# Patient Record
Sex: Female | Born: 1935 | Race: Black or African American | Hispanic: No | Smoking: Never smoker
Health system: Southern US, Community
[De-identification: ages and names within clinical notes are randomized; demographics above are authoritative.]

## PROBLEM LIST (undated history)

## (undated) DIAGNOSIS — D62 Acute posthemorrhagic anemia: Secondary | ICD-10-CM

## (undated) DIAGNOSIS — I10 Essential (primary) hypertension: Secondary | ICD-10-CM

## (undated) DIAGNOSIS — F03C0 Unspecified dementia, severe, without behavioral disturbance, psychotic disturbance, mood disturbance, and anxiety: Secondary | ICD-10-CM

## (undated) DIAGNOSIS — R011 Cardiac murmur, unspecified: Secondary | ICD-10-CM

## (undated) DIAGNOSIS — K922 Gastrointestinal hemorrhage, unspecified: Secondary | ICD-10-CM

## (undated) DIAGNOSIS — K5790 Diverticulosis of intestine, part unspecified, without perforation or abscess without bleeding: Secondary | ICD-10-CM

## (undated) DIAGNOSIS — M1990 Unspecified osteoarthritis, unspecified site: Secondary | ICD-10-CM

## (undated) DIAGNOSIS — R413 Other amnesia: Secondary | ICD-10-CM

## (undated) DIAGNOSIS — F0390 Unspecified dementia without behavioral disturbance: Secondary | ICD-10-CM

## (undated) HISTORY — DX: Essential (primary) hypertension: I10

## (undated) HISTORY — DX: Cardiac murmur, unspecified: R01.1

## (undated) HISTORY — DX: Unspecified osteoarthritis, unspecified site: M19.90

---

## 1977-11-16 HISTORY — PX: DILATION AND CURETTAGE OF UTERUS: SHX78

## 1979-11-17 HISTORY — PX: ABDOMINAL HYSTERECTOMY: SHX81

## 1980-11-16 HISTORY — PX: COLONOSCOPY: SHX174

## 1990-11-16 HISTORY — PX: BREAST BIOPSY: SHX20

## 1990-11-16 HISTORY — PX: BLADDER SURGERY: SHX569

## 1992-11-16 HISTORY — PX: RECTOCELE REPAIR: SHX761

## 2000-08-09 HISTORY — PX: CHOLECYSTECTOMY: SHX55

## 2003-11-17 DIAGNOSIS — R011 Cardiac murmur, unspecified: Secondary | ICD-10-CM

## 2003-11-17 DIAGNOSIS — M1990 Unspecified osteoarthritis, unspecified site: Secondary | ICD-10-CM

## 2003-11-17 HISTORY — DX: Cardiac murmur, unspecified: R01.1

## 2003-11-17 HISTORY — DX: Unspecified osteoarthritis, unspecified site: M19.90

## 2004-03-22 ENCOUNTER — Other Ambulatory Visit: Payer: Self-pay

## 2004-09-25 ENCOUNTER — Ambulatory Visit: Payer: Self-pay | Admitting: General Surgery

## 2005-01-09 ENCOUNTER — Emergency Department: Payer: Self-pay | Admitting: Emergency Medicine

## 2005-01-12 ENCOUNTER — Emergency Department: Payer: Self-pay | Admitting: Emergency Medicine

## 2005-07-16 ENCOUNTER — Other Ambulatory Visit: Payer: Self-pay

## 2005-07-16 ENCOUNTER — Emergency Department: Payer: Self-pay | Admitting: Emergency Medicine

## 2005-10-01 ENCOUNTER — Ambulatory Visit: Payer: Self-pay | Admitting: General Surgery

## 2006-01-28 ENCOUNTER — Emergency Department: Payer: Self-pay | Admitting: Emergency Medicine

## 2006-01-28 ENCOUNTER — Other Ambulatory Visit: Payer: Self-pay

## 2006-01-30 ENCOUNTER — Other Ambulatory Visit: Payer: Self-pay

## 2006-01-30 ENCOUNTER — Emergency Department: Payer: Self-pay | Admitting: Internal Medicine

## 2006-08-16 ENCOUNTER — Ambulatory Visit: Payer: Self-pay | Admitting: Gastroenterology

## 2006-10-19 ENCOUNTER — Ambulatory Visit: Payer: Self-pay | Admitting: General Surgery

## 2007-03-17 ENCOUNTER — Emergency Department: Payer: Self-pay | Admitting: Unknown Physician Specialty

## 2007-05-31 ENCOUNTER — Inpatient Hospital Stay: Payer: Self-pay | Admitting: Internal Medicine

## 2007-05-31 ENCOUNTER — Other Ambulatory Visit: Payer: Self-pay

## 2007-06-09 ENCOUNTER — Emergency Department: Payer: Self-pay

## 2007-06-11 ENCOUNTER — Emergency Department: Payer: Self-pay

## 2007-06-11 ENCOUNTER — Other Ambulatory Visit: Payer: Self-pay

## 2007-06-19 ENCOUNTER — Other Ambulatory Visit: Payer: Self-pay

## 2007-06-19 ENCOUNTER — Inpatient Hospital Stay: Payer: Self-pay | Admitting: *Deleted

## 2007-06-25 ENCOUNTER — Emergency Department: Payer: Self-pay | Admitting: Internal Medicine

## 2007-06-25 ENCOUNTER — Other Ambulatory Visit: Payer: Self-pay

## 2007-07-01 ENCOUNTER — Ambulatory Visit: Payer: Self-pay | Admitting: Orthopaedic Surgery

## 2007-07-07 ENCOUNTER — Emergency Department: Payer: Self-pay | Admitting: Emergency Medicine

## 2007-07-07 ENCOUNTER — Other Ambulatory Visit: Payer: Self-pay

## 2007-11-01 ENCOUNTER — Ambulatory Visit: Payer: Self-pay | Admitting: General Surgery

## 2008-02-09 ENCOUNTER — Ambulatory Visit: Payer: Self-pay | Admitting: Family Medicine

## 2008-03-29 ENCOUNTER — Emergency Department: Payer: Self-pay | Admitting: Emergency Medicine

## 2008-05-02 ENCOUNTER — Ambulatory Visit: Payer: Self-pay | Admitting: Gastroenterology

## 2008-11-20 ENCOUNTER — Ambulatory Visit: Payer: Self-pay | Admitting: General Surgery

## 2009-09-09 ENCOUNTER — Ambulatory Visit: Payer: Self-pay | Admitting: Family Medicine

## 2009-10-01 ENCOUNTER — Emergency Department: Payer: Self-pay | Admitting: Emergency Medicine

## 2009-10-07 ENCOUNTER — Emergency Department: Payer: Self-pay | Admitting: Unknown Physician Specialty

## 2009-11-16 HISTORY — PX: CARDIAC CATHETERIZATION: SHX172

## 2009-12-23 ENCOUNTER — Ambulatory Visit: Payer: Self-pay | Admitting: General Surgery

## 2010-01-02 ENCOUNTER — Emergency Department: Payer: Self-pay | Admitting: Emergency Medicine

## 2010-01-30 ENCOUNTER — Emergency Department: Payer: Self-pay | Admitting: Emergency Medicine

## 2010-07-01 ENCOUNTER — Ambulatory Visit: Payer: Self-pay | Admitting: Emergency Medicine

## 2010-08-24 ENCOUNTER — Inpatient Hospital Stay: Payer: Self-pay | Admitting: Internal Medicine

## 2010-09-04 ENCOUNTER — Emergency Department: Payer: Self-pay | Admitting: Emergency Medicine

## 2010-12-07 ENCOUNTER — Emergency Department: Payer: Self-pay | Admitting: Emergency Medicine

## 2010-12-24 ENCOUNTER — Ambulatory Visit: Payer: Self-pay | Admitting: General Surgery

## 2011-05-18 ENCOUNTER — Ambulatory Visit: Payer: Self-pay | Admitting: Rheumatology

## 2011-07-13 ENCOUNTER — Ambulatory Visit: Payer: Self-pay | Admitting: Rheumatology

## 2011-09-29 ENCOUNTER — Emergency Department: Payer: Self-pay | Admitting: Emergency Medicine

## 2011-11-24 DIAGNOSIS — N952 Postmenopausal atrophic vaginitis: Secondary | ICD-10-CM | POA: Diagnosis not present

## 2011-11-24 DIAGNOSIS — N8111 Cystocele, midline: Secondary | ICD-10-CM | POA: Diagnosis not present

## 2011-11-24 DIAGNOSIS — Z01419 Encounter for gynecological examination (general) (routine) without abnormal findings: Secondary | ICD-10-CM | POA: Diagnosis not present

## 2011-12-08 DIAGNOSIS — Z1211 Encounter for screening for malignant neoplasm of colon: Secondary | ICD-10-CM | POA: Diagnosis not present

## 2011-12-09 DIAGNOSIS — F329 Major depressive disorder, single episode, unspecified: Secondary | ICD-10-CM | POA: Diagnosis not present

## 2011-12-09 DIAGNOSIS — F4323 Adjustment disorder with mixed anxiety and depressed mood: Secondary | ICD-10-CM | POA: Diagnosis not present

## 2011-12-17 DIAGNOSIS — H409 Unspecified glaucoma: Secondary | ICD-10-CM | POA: Diagnosis not present

## 2011-12-17 DIAGNOSIS — H4011X0 Primary open-angle glaucoma, stage unspecified: Secondary | ICD-10-CM | POA: Diagnosis not present

## 2011-12-25 DIAGNOSIS — J069 Acute upper respiratory infection, unspecified: Secondary | ICD-10-CM | POA: Diagnosis not present

## 2011-12-25 DIAGNOSIS — J309 Allergic rhinitis, unspecified: Secondary | ICD-10-CM | POA: Diagnosis not present

## 2011-12-25 DIAGNOSIS — R51 Headache: Secondary | ICD-10-CM | POA: Diagnosis not present

## 2011-12-30 ENCOUNTER — Ambulatory Visit: Payer: Self-pay | Admitting: General Surgery

## 2011-12-30 DIAGNOSIS — Z1231 Encounter for screening mammogram for malignant neoplasm of breast: Secondary | ICD-10-CM | POA: Diagnosis not present

## 2012-01-01 DIAGNOSIS — S4380XA Sprain of other specified parts of unspecified shoulder girdle, initial encounter: Secondary | ICD-10-CM | POA: Diagnosis not present

## 2012-01-09 DIAGNOSIS — K5289 Other specified noninfective gastroenteritis and colitis: Secondary | ICD-10-CM | POA: Diagnosis not present

## 2012-01-09 DIAGNOSIS — J309 Allergic rhinitis, unspecified: Secondary | ICD-10-CM | POA: Diagnosis not present

## 2012-01-09 DIAGNOSIS — R51 Headache: Secondary | ICD-10-CM | POA: Diagnosis not present

## 2012-01-13 DIAGNOSIS — M7530 Calcific tendinitis of unspecified shoulder: Secondary | ICD-10-CM | POA: Diagnosis not present

## 2012-02-01 DIAGNOSIS — F329 Major depressive disorder, single episode, unspecified: Secondary | ICD-10-CM | POA: Diagnosis not present

## 2012-02-01 DIAGNOSIS — F4323 Adjustment disorder with mixed anxiety and depressed mood: Secondary | ICD-10-CM | POA: Diagnosis not present

## 2012-02-04 DIAGNOSIS — Z803 Family history of malignant neoplasm of breast: Secondary | ICD-10-CM | POA: Diagnosis not present

## 2012-02-04 DIAGNOSIS — N6019 Diffuse cystic mastopathy of unspecified breast: Secondary | ICD-10-CM | POA: Diagnosis not present

## 2012-02-05 DIAGNOSIS — J0100 Acute maxillary sinusitis, unspecified: Secondary | ICD-10-CM | POA: Diagnosis not present

## 2012-02-05 DIAGNOSIS — J309 Allergic rhinitis, unspecified: Secondary | ICD-10-CM | POA: Diagnosis not present

## 2012-02-05 DIAGNOSIS — R51 Headache: Secondary | ICD-10-CM | POA: Diagnosis not present

## 2012-02-25 DIAGNOSIS — T148XXA Other injury of unspecified body region, initial encounter: Secondary | ICD-10-CM | POA: Diagnosis not present

## 2012-02-25 DIAGNOSIS — R51 Headache: Secondary | ICD-10-CM | POA: Diagnosis not present

## 2012-02-25 DIAGNOSIS — J309 Allergic rhinitis, unspecified: Secondary | ICD-10-CM | POA: Diagnosis not present

## 2012-03-15 DIAGNOSIS — I10 Essential (primary) hypertension: Secondary | ICD-10-CM | POA: Diagnosis not present

## 2012-03-17 DIAGNOSIS — M67919 Unspecified disorder of synovium and tendon, unspecified shoulder: Secondary | ICD-10-CM | POA: Diagnosis not present

## 2012-03-28 ENCOUNTER — Emergency Department: Payer: Self-pay | Admitting: Emergency Medicine

## 2012-03-28 DIAGNOSIS — I10 Essential (primary) hypertension: Secondary | ICD-10-CM | POA: Diagnosis not present

## 2012-03-28 DIAGNOSIS — Z79899 Other long term (current) drug therapy: Secondary | ICD-10-CM | POA: Diagnosis not present

## 2012-03-28 DIAGNOSIS — M129 Arthropathy, unspecified: Secondary | ICD-10-CM | POA: Diagnosis not present

## 2012-03-28 DIAGNOSIS — R0602 Shortness of breath: Secondary | ICD-10-CM | POA: Diagnosis not present

## 2012-03-28 DIAGNOSIS — J309 Allergic rhinitis, unspecified: Secondary | ICD-10-CM | POA: Diagnosis not present

## 2012-03-28 DIAGNOSIS — R51 Headache: Secondary | ICD-10-CM | POA: Diagnosis not present

## 2012-03-28 LAB — CBC WITH DIFFERENTIAL/PLATELET
Basophil %: 1.3 %
Eosinophil #: 0.2 10*3/uL (ref 0.0–0.7)
Eosinophil %: 3.3 %
Lymphocyte #: 1.4 10*3/uL (ref 1.0–3.6)
Lymphocyte %: 29.8 %
MCH: 34 pg (ref 26.0–34.0)
MCHC: 34 g/dL (ref 32.0–36.0)
MCV: 100 fL (ref 80–100)
Monocyte #: 0.3 x10 3/mm (ref 0.2–0.9)
Neutrophil %: 59.6 %
RDW: 13 % (ref 11.5–14.5)

## 2012-03-28 LAB — COMPREHENSIVE METABOLIC PANEL
Alkaline Phosphatase: 57 U/L (ref 50–136)
Anion Gap: 6 — ABNORMAL LOW (ref 7–16)
BUN: 13 mg/dL (ref 7–18)
Bilirubin,Total: 0.8 mg/dL (ref 0.2–1.0)
Calcium, Total: 9.2 mg/dL (ref 8.5–10.1)
Chloride: 105 mmol/L (ref 98–107)
Co2: 28 mmol/L (ref 21–32)
Creatinine: 0.6 mg/dL (ref 0.60–1.30)
EGFR (African American): >60
EGFR (Non-African Amer.): >60
Osmolality: 277 (ref 275–301)
Potassium: 3.9 mmol/L (ref 3.5–5.1)
Sodium: 139 mmol/L (ref 136–145)

## 2012-03-28 LAB — URINALYSIS, COMPLETE
Bacteria: NONE SEEN
Bilirubin,UR: NEGATIVE
Glucose,UR: NEGATIVE mg/dL (ref 0–75)
Ph: 7 (ref 4.5–8.0)
Protein: NEGATIVE
Specific Gravity: 1.005 (ref 1.003–1.030)

## 2012-04-06 DIAGNOSIS — M79609 Pain in unspecified limb: Secondary | ICD-10-CM | POA: Diagnosis not present

## 2012-04-07 DIAGNOSIS — H4011X0 Primary open-angle glaucoma, stage unspecified: Secondary | ICD-10-CM | POA: Diagnosis not present

## 2012-04-07 DIAGNOSIS — H409 Unspecified glaucoma: Secondary | ICD-10-CM | POA: Diagnosis not present

## 2012-05-02 DIAGNOSIS — F329 Major depressive disorder, single episode, unspecified: Secondary | ICD-10-CM | POA: Diagnosis not present

## 2012-05-02 DIAGNOSIS — F4323 Adjustment disorder with mixed anxiety and depressed mood: Secondary | ICD-10-CM | POA: Diagnosis not present

## 2012-05-08 ENCOUNTER — Inpatient Hospital Stay: Payer: Self-pay | Admitting: Internal Medicine

## 2012-05-08 DIAGNOSIS — K5730 Diverticulosis of large intestine without perforation or abscess without bleeding: Secondary | ICD-10-CM | POA: Diagnosis not present

## 2012-05-08 DIAGNOSIS — I252 Old myocardial infarction: Secondary | ICD-10-CM | POA: Diagnosis not present

## 2012-05-08 DIAGNOSIS — I2510 Atherosclerotic heart disease of native coronary artery without angina pectoris: Secondary | ICD-10-CM | POA: Diagnosis not present

## 2012-05-08 DIAGNOSIS — K625 Hemorrhage of anus and rectum: Secondary | ICD-10-CM | POA: Diagnosis not present

## 2012-05-08 DIAGNOSIS — K5731 Diverticulosis of large intestine without perforation or abscess with bleeding: Secondary | ICD-10-CM | POA: Diagnosis not present

## 2012-05-08 DIAGNOSIS — K922 Gastrointestinal hemorrhage, unspecified: Secondary | ICD-10-CM | POA: Diagnosis not present

## 2012-05-08 DIAGNOSIS — I10 Essential (primary) hypertension: Secondary | ICD-10-CM | POA: Diagnosis not present

## 2012-05-08 DIAGNOSIS — I200 Unstable angina: Secondary | ICD-10-CM | POA: Diagnosis not present

## 2012-05-08 DIAGNOSIS — Z23 Encounter for immunization: Secondary | ICD-10-CM | POA: Diagnosis not present

## 2012-05-08 DIAGNOSIS — H409 Unspecified glaucoma: Secondary | ICD-10-CM | POA: Diagnosis present

## 2012-05-08 DIAGNOSIS — Z7982 Long term (current) use of aspirin: Secondary | ICD-10-CM | POA: Diagnosis not present

## 2012-05-08 DIAGNOSIS — I248 Other forms of acute ischemic heart disease: Secondary | ICD-10-CM | POA: Diagnosis not present

## 2012-05-08 DIAGNOSIS — M1990 Unspecified osteoarthritis, unspecified site: Secondary | ICD-10-CM | POA: Diagnosis not present

## 2012-05-08 DIAGNOSIS — K921 Melena: Secondary | ICD-10-CM | POA: Diagnosis not present

## 2012-05-08 DIAGNOSIS — R6889 Other general symptoms and signs: Secondary | ICD-10-CM | POA: Diagnosis not present

## 2012-05-08 DIAGNOSIS — K219 Gastro-esophageal reflux disease without esophagitis: Secondary | ICD-10-CM | POA: Diagnosis not present

## 2012-05-08 DIAGNOSIS — I38 Endocarditis, valve unspecified: Secondary | ICD-10-CM | POA: Diagnosis not present

## 2012-05-08 LAB — COMPREHENSIVE METABOLIC PANEL
Alkaline Phosphatase: 65 U/L (ref 50–136)
Anion Gap: 6 — ABNORMAL LOW (ref 7–16)
BUN: 18 mg/dL (ref 7–18)
Bilirubin,Total: 0.3 mg/dL (ref 0.2–1.0)
Calcium, Total: 8.6 mg/dL (ref 8.5–10.1)
Chloride: 103 mmol/L (ref 98–107)
Co2: 27 mmol/L (ref 21–32)
Potassium: 3.9 mmol/L (ref 3.5–5.1)
SGOT(AST): 30 U/L (ref 15–37)

## 2012-05-08 LAB — CBC
HGB: 11 g/dL — ABNORMAL LOW (ref 12.0–16.0)
MCH: 33.5 pg (ref 26.0–34.0)
MCHC: 33.3 g/dL (ref 32.0–36.0)
Platelet: 220 10*3/uL (ref 150–440)
RDW: 12.4 % (ref 11.5–14.5)

## 2012-05-08 LAB — HEMATOCRIT: HCT: 31.4 % — ABNORMAL LOW (ref 35.0–47.0)

## 2012-05-08 LAB — TSH: Thyroid Stimulating Horm: 1.47 u[IU]/mL

## 2012-05-08 LAB — TROPONIN I
Troponin-I: 0.12 ng/mL — ABNORMAL HIGH
Troponin-I: 0.12 ng/mL — ABNORMAL HIGH

## 2012-05-08 LAB — LIPASE, BLOOD: Lipase: 117 U/L (ref 73–393)

## 2012-05-09 LAB — CBC WITH DIFFERENTIAL/PLATELET
Basophil #: 0 10*3/uL (ref 0.0–0.1)
Eosinophil #: 0.2 10*3/uL (ref 0.0–0.7)
HCT: 29.2 % — ABNORMAL LOW (ref 35.0–47.0)
HGB: 9.9 g/dL — ABNORMAL LOW (ref 12.0–16.0)
Lymphocyte #: 0.9 10*3/uL — ABNORMAL LOW (ref 1.0–3.6)
Lymphocyte %: 23.2 %
MCH: 33.9 pg (ref 26.0–34.0)
MCHC: 33.9 g/dL (ref 32.0–36.0)
MCV: 100 fL (ref 80–100)
Monocyte #: 0.3 x10 3/mm (ref 0.2–0.9)
Monocyte %: 8.9 %
Neutrophil #: 2.3 10*3/uL (ref 1.4–6.5)
Platelet: 194 10*3/uL (ref 150–440)
RBC: 2.92 10*6/uL — ABNORMAL LOW (ref 3.80–5.20)
RDW: 12.7 % (ref 11.5–14.5)

## 2012-05-09 LAB — BASIC METABOLIC PANEL
Anion Gap: 6 — ABNORMAL LOW (ref 7–16)
Calcium, Total: 8.2 mg/dL — ABNORMAL LOW (ref 8.5–10.1)
Co2: 26 mmol/L (ref 21–32)
EGFR (Non-African Amer.): >60
Osmolality: 280 (ref 275–301)
Sodium: 141 mmol/L (ref 136–145)

## 2012-05-10 LAB — CBC WITH DIFFERENTIAL/PLATELET
Basophil #: 0.1 10*3/uL (ref 0.0–0.1)
Basophil %: 2.3 %
Eosinophil #: 0.2 10*3/uL (ref 0.0–0.7)
HCT: 30 % — ABNORMAL LOW (ref 35.0–47.0)
MCHC: 33.4 g/dL (ref 32.0–36.0)
MCV: 101 fL — ABNORMAL HIGH (ref 80–100)
Platelet: 192 10*3/uL (ref 150–440)
RBC: 2.98 10*6/uL — ABNORMAL LOW (ref 3.80–5.20)
RDW: 12.7 % (ref 11.5–14.5)
WBC: 3.3 10*3/uL — ABNORMAL LOW (ref 3.6–11.0)

## 2012-05-11 DIAGNOSIS — IMO0001 Reserved for inherently not codable concepts without codable children: Secondary | ICD-10-CM | POA: Diagnosis not present

## 2012-05-11 DIAGNOSIS — M79609 Pain in unspecified limb: Secondary | ICD-10-CM | POA: Diagnosis not present

## 2012-05-11 DIAGNOSIS — M1710 Unilateral primary osteoarthritis, unspecified knee: Secondary | ICD-10-CM | POA: Diagnosis not present

## 2012-05-11 DIAGNOSIS — R269 Unspecified abnormalities of gait and mobility: Secondary | ICD-10-CM | POA: Diagnosis not present

## 2012-05-11 DIAGNOSIS — I2510 Atherosclerotic heart disease of native coronary artery without angina pectoris: Secondary | ICD-10-CM | POA: Diagnosis not present

## 2012-05-11 DIAGNOSIS — I10 Essential (primary) hypertension: Secondary | ICD-10-CM | POA: Diagnosis not present

## 2012-05-16 DIAGNOSIS — M1710 Unilateral primary osteoarthritis, unspecified knee: Secondary | ICD-10-CM | POA: Diagnosis not present

## 2012-05-16 DIAGNOSIS — M79609 Pain in unspecified limb: Secondary | ICD-10-CM | POA: Diagnosis not present

## 2012-05-16 DIAGNOSIS — D649 Anemia, unspecified: Secondary | ICD-10-CM | POA: Diagnosis not present

## 2012-05-16 DIAGNOSIS — I2510 Atherosclerotic heart disease of native coronary artery without angina pectoris: Secondary | ICD-10-CM | POA: Diagnosis not present

## 2012-05-16 DIAGNOSIS — K5732 Diverticulitis of large intestine without perforation or abscess without bleeding: Secondary | ICD-10-CM | POA: Diagnosis not present

## 2012-05-16 DIAGNOSIS — I10 Essential (primary) hypertension: Secondary | ICD-10-CM | POA: Diagnosis not present

## 2012-05-16 DIAGNOSIS — IMO0001 Reserved for inherently not codable concepts without codable children: Secondary | ICD-10-CM | POA: Diagnosis not present

## 2012-05-16 DIAGNOSIS — R269 Unspecified abnormalities of gait and mobility: Secondary | ICD-10-CM | POA: Diagnosis not present

## 2012-05-20 DIAGNOSIS — M1710 Unilateral primary osteoarthritis, unspecified knee: Secondary | ICD-10-CM | POA: Diagnosis not present

## 2012-05-20 DIAGNOSIS — I2510 Atherosclerotic heart disease of native coronary artery without angina pectoris: Secondary | ICD-10-CM | POA: Diagnosis not present

## 2012-05-20 DIAGNOSIS — IMO0001 Reserved for inherently not codable concepts without codable children: Secondary | ICD-10-CM | POA: Diagnosis not present

## 2012-05-20 DIAGNOSIS — I10 Essential (primary) hypertension: Secondary | ICD-10-CM | POA: Diagnosis not present

## 2012-05-20 DIAGNOSIS — M79609 Pain in unspecified limb: Secondary | ICD-10-CM | POA: Diagnosis not present

## 2012-05-20 DIAGNOSIS — R269 Unspecified abnormalities of gait and mobility: Secondary | ICD-10-CM | POA: Diagnosis not present

## 2012-05-24 DIAGNOSIS — R269 Unspecified abnormalities of gait and mobility: Secondary | ICD-10-CM | POA: Diagnosis not present

## 2012-05-24 DIAGNOSIS — I2510 Atherosclerotic heart disease of native coronary artery without angina pectoris: Secondary | ICD-10-CM | POA: Diagnosis not present

## 2012-05-24 DIAGNOSIS — I10 Essential (primary) hypertension: Secondary | ICD-10-CM | POA: Diagnosis not present

## 2012-05-24 DIAGNOSIS — M79609 Pain in unspecified limb: Secondary | ICD-10-CM | POA: Diagnosis not present

## 2012-05-24 DIAGNOSIS — M1710 Unilateral primary osteoarthritis, unspecified knee: Secondary | ICD-10-CM | POA: Diagnosis not present

## 2012-05-24 DIAGNOSIS — IMO0001 Reserved for inherently not codable concepts without codable children: Secondary | ICD-10-CM | POA: Diagnosis not present

## 2012-05-26 DIAGNOSIS — R269 Unspecified abnormalities of gait and mobility: Secondary | ICD-10-CM | POA: Diagnosis not present

## 2012-05-26 DIAGNOSIS — M1710 Unilateral primary osteoarthritis, unspecified knee: Secondary | ICD-10-CM | POA: Diagnosis not present

## 2012-05-26 DIAGNOSIS — I10 Essential (primary) hypertension: Secondary | ICD-10-CM | POA: Diagnosis not present

## 2012-05-26 DIAGNOSIS — IMO0001 Reserved for inherently not codable concepts without codable children: Secondary | ICD-10-CM | POA: Diagnosis not present

## 2012-05-26 DIAGNOSIS — M79609 Pain in unspecified limb: Secondary | ICD-10-CM | POA: Diagnosis not present

## 2012-05-26 DIAGNOSIS — I2510 Atherosclerotic heart disease of native coronary artery without angina pectoris: Secondary | ICD-10-CM | POA: Diagnosis not present

## 2012-05-31 DIAGNOSIS — M79609 Pain in unspecified limb: Secondary | ICD-10-CM | POA: Diagnosis not present

## 2012-05-31 DIAGNOSIS — M1710 Unilateral primary osteoarthritis, unspecified knee: Secondary | ICD-10-CM | POA: Diagnosis not present

## 2012-05-31 DIAGNOSIS — I10 Essential (primary) hypertension: Secondary | ICD-10-CM | POA: Diagnosis not present

## 2012-05-31 DIAGNOSIS — IMO0001 Reserved for inherently not codable concepts without codable children: Secondary | ICD-10-CM | POA: Diagnosis not present

## 2012-05-31 DIAGNOSIS — I2510 Atherosclerotic heart disease of native coronary artery without angina pectoris: Secondary | ICD-10-CM | POA: Diagnosis not present

## 2012-05-31 DIAGNOSIS — R269 Unspecified abnormalities of gait and mobility: Secondary | ICD-10-CM | POA: Diagnosis not present

## 2012-06-09 DIAGNOSIS — K625 Hemorrhage of anus and rectum: Secondary | ICD-10-CM | POA: Diagnosis not present

## 2012-06-22 DIAGNOSIS — M1710 Unilateral primary osteoarthritis, unspecified knee: Secondary | ICD-10-CM | POA: Diagnosis not present

## 2012-06-22 DIAGNOSIS — M79609 Pain in unspecified limb: Secondary | ICD-10-CM | POA: Diagnosis not present

## 2012-06-22 DIAGNOSIS — R269 Unspecified abnormalities of gait and mobility: Secondary | ICD-10-CM | POA: Diagnosis not present

## 2012-06-22 DIAGNOSIS — IMO0001 Reserved for inherently not codable concepts without codable children: Secondary | ICD-10-CM | POA: Diagnosis not present

## 2012-06-23 DIAGNOSIS — D509 Iron deficiency anemia, unspecified: Secondary | ICD-10-CM | POA: Diagnosis not present

## 2012-06-23 DIAGNOSIS — E538 Deficiency of other specified B group vitamins: Secondary | ICD-10-CM | POA: Diagnosis not present

## 2012-06-30 DIAGNOSIS — Z1211 Encounter for screening for malignant neoplasm of colon: Secondary | ICD-10-CM | POA: Diagnosis not present

## 2012-07-06 DIAGNOSIS — R5381 Other malaise: Secondary | ICD-10-CM | POA: Diagnosis not present

## 2012-07-06 DIAGNOSIS — R5383 Other fatigue: Secondary | ICD-10-CM | POA: Diagnosis not present

## 2012-07-06 DIAGNOSIS — R269 Unspecified abnormalities of gait and mobility: Secondary | ICD-10-CM | POA: Diagnosis not present

## 2012-07-06 DIAGNOSIS — M1710 Unilateral primary osteoarthritis, unspecified knee: Secondary | ICD-10-CM | POA: Diagnosis not present

## 2012-07-06 DIAGNOSIS — M79609 Pain in unspecified limb: Secondary | ICD-10-CM | POA: Diagnosis not present

## 2012-07-08 ENCOUNTER — Emergency Department: Payer: Self-pay | Admitting: Emergency Medicine

## 2012-07-08 DIAGNOSIS — I10 Essential (primary) hypertension: Secondary | ICD-10-CM | POA: Diagnosis not present

## 2012-07-08 DIAGNOSIS — R51 Headache: Secondary | ICD-10-CM | POA: Diagnosis not present

## 2012-07-08 DIAGNOSIS — Z79899 Other long term (current) drug therapy: Secondary | ICD-10-CM | POA: Diagnosis not present

## 2012-07-08 DIAGNOSIS — M129 Arthropathy, unspecified: Secondary | ICD-10-CM | POA: Diagnosis not present

## 2012-07-15 DIAGNOSIS — M1710 Unilateral primary osteoarthritis, unspecified knee: Secondary | ICD-10-CM | POA: Diagnosis not present

## 2012-07-15 DIAGNOSIS — R269 Unspecified abnormalities of gait and mobility: Secondary | ICD-10-CM | POA: Diagnosis not present

## 2012-07-15 DIAGNOSIS — M79609 Pain in unspecified limb: Secondary | ICD-10-CM | POA: Diagnosis not present

## 2012-07-15 DIAGNOSIS — J019 Acute sinusitis, unspecified: Secondary | ICD-10-CM | POA: Diagnosis not present

## 2012-07-22 ENCOUNTER — Emergency Department: Payer: Self-pay | Admitting: Emergency Medicine

## 2012-07-22 DIAGNOSIS — R5383 Other fatigue: Secondary | ICD-10-CM | POA: Diagnosis not present

## 2012-07-22 DIAGNOSIS — M1990 Unspecified osteoarthritis, unspecified site: Secondary | ICD-10-CM | POA: Diagnosis not present

## 2012-07-22 DIAGNOSIS — I10 Essential (primary) hypertension: Secondary | ICD-10-CM | POA: Diagnosis not present

## 2012-07-22 DIAGNOSIS — R5381 Other malaise: Secondary | ICD-10-CM | POA: Diagnosis not present

## 2012-07-22 LAB — CBC WITH DIFFERENTIAL/PLATELET
Basophil %: 1.2 %
Eosinophil %: 4 %
HCT: 39.1 % (ref 35.0–47.0)
HGB: 13.5 g/dL (ref 12.0–16.0)
Lymphocyte %: 31.2 %
MCH: 34.3 pg — ABNORMAL HIGH (ref 26.0–34.0)
MCHC: 34.6 g/dL (ref 32.0–36.0)
MCV: 99 fL (ref 80–100)
Monocyte %: 9.1 %
Neutrophil %: 54.5 %
RBC: 3.93 10*6/uL (ref 3.80–5.20)

## 2012-07-22 LAB — CK TOTAL AND CKMB (NOT AT ARMC)
CK, Total: 219 U/L — ABNORMAL HIGH (ref 21–215)
CK-MB: 5.8 ng/mL — ABNORMAL HIGH (ref 0.5–3.6)

## 2012-07-22 LAB — URINALYSIS, COMPLETE
Bacteria: NONE SEEN
Bilirubin,UR: NEGATIVE
Ketone: NEGATIVE
RBC,UR: NONE SEEN /HPF (ref 0–5)
Squamous Epithelial: NONE SEEN
WBC UR: NONE SEEN /HPF (ref 0–5)

## 2012-07-22 LAB — HEPATIC FUNCTION PANEL A (ARMC)
Albumin: 3.9 g/dL (ref 3.4–5.0)
Alkaline Phosphatase: 64 U/L (ref 50–136)
Bilirubin, Direct: 0.2 mg/dL (ref 0.00–0.20)
SGPT (ALT): 26 U/L (ref 12–78)
Total Protein: 8.1 g/dL (ref 6.4–8.2)

## 2012-07-22 LAB — TROPONIN I: Troponin-I: 0.14 ng/mL — ABNORMAL HIGH

## 2012-07-22 LAB — BASIC METABOLIC PANEL
Calcium, Total: 9.2 mg/dL (ref 8.5–10.1)
EGFR (Non-African Amer.): >60
Glucose: 109 mg/dL — ABNORMAL HIGH (ref 65–99)
Osmolality: 273 (ref 275–301)
Potassium: 4.1 mmol/L (ref 3.5–5.1)

## 2012-07-22 LAB — TSH: Thyroid Stimulating Horm: 0.911 u[IU]/mL

## 2012-07-29 DIAGNOSIS — I10 Essential (primary) hypertension: Secondary | ICD-10-CM | POA: Diagnosis not present

## 2012-07-29 DIAGNOSIS — M79609 Pain in unspecified limb: Secondary | ICD-10-CM | POA: Diagnosis not present

## 2012-07-29 DIAGNOSIS — I2510 Atherosclerotic heart disease of native coronary artery without angina pectoris: Secondary | ICD-10-CM | POA: Diagnosis not present

## 2012-07-29 DIAGNOSIS — R0602 Shortness of breath: Secondary | ICD-10-CM | POA: Diagnosis not present

## 2012-08-03 DIAGNOSIS — K219 Gastro-esophageal reflux disease without esophagitis: Secondary | ICD-10-CM | POA: Diagnosis not present

## 2012-08-03 DIAGNOSIS — R1011 Right upper quadrant pain: Secondary | ICD-10-CM | POA: Diagnosis not present

## 2012-08-03 DIAGNOSIS — R109 Unspecified abdominal pain: Secondary | ICD-10-CM | POA: Diagnosis not present

## 2012-08-03 DIAGNOSIS — K222 Esophageal obstruction: Secondary | ICD-10-CM | POA: Diagnosis not present

## 2012-08-08 DIAGNOSIS — K319 Disease of stomach and duodenum, unspecified: Secondary | ICD-10-CM | POA: Diagnosis not present

## 2012-08-08 DIAGNOSIS — K2970 Gastritis, unspecified, without bleeding: Secondary | ICD-10-CM | POA: Diagnosis not present

## 2012-08-08 DIAGNOSIS — R109 Unspecified abdominal pain: Secondary | ICD-10-CM | POA: Diagnosis not present

## 2012-08-08 DIAGNOSIS — K2940 Chronic atrophic gastritis without bleeding: Secondary | ICD-10-CM | POA: Diagnosis not present

## 2012-08-08 DIAGNOSIS — K449 Diaphragmatic hernia without obstruction or gangrene: Secondary | ICD-10-CM | POA: Diagnosis not present

## 2012-08-08 DIAGNOSIS — K2990 Gastroduodenitis, unspecified, without bleeding: Secondary | ICD-10-CM | POA: Diagnosis not present

## 2012-08-12 ENCOUNTER — Ambulatory Visit: Payer: Self-pay | Admitting: Emergency Medicine

## 2012-08-12 DIAGNOSIS — K224 Dyskinesia of esophagus: Secondary | ICD-10-CM | POA: Diagnosis not present

## 2012-08-12 DIAGNOSIS — R1310 Dysphagia, unspecified: Secondary | ICD-10-CM | POA: Diagnosis not present

## 2012-08-16 DIAGNOSIS — I69991 Dysphagia following unspecified cerebrovascular disease: Secondary | ICD-10-CM | POA: Diagnosis not present

## 2012-08-25 DIAGNOSIS — H2510 Age-related nuclear cataract, unspecified eye: Secondary | ICD-10-CM | POA: Diagnosis not present

## 2012-08-25 DIAGNOSIS — H4011X0 Primary open-angle glaucoma, stage unspecified: Secondary | ICD-10-CM | POA: Diagnosis not present

## 2012-08-25 DIAGNOSIS — H409 Unspecified glaucoma: Secondary | ICD-10-CM | POA: Diagnosis not present

## 2012-08-31 DIAGNOSIS — N993 Prolapse of vaginal vault after hysterectomy: Secondary | ICD-10-CM | POA: Diagnosis not present

## 2012-08-31 DIAGNOSIS — N8111 Cystocele, midline: Secondary | ICD-10-CM | POA: Diagnosis not present

## 2012-08-31 DIAGNOSIS — R339 Retention of urine, unspecified: Secondary | ICD-10-CM | POA: Diagnosis not present

## 2012-08-31 DIAGNOSIS — N3941 Urge incontinence: Secondary | ICD-10-CM | POA: Diagnosis not present

## 2012-09-12 DIAGNOSIS — M79609 Pain in unspecified limb: Secondary | ICD-10-CM | POA: Diagnosis not present

## 2012-09-12 DIAGNOSIS — Z0000 Encounter for general adult medical examination without abnormal findings: Secondary | ICD-10-CM | POA: Diagnosis not present

## 2012-09-12 DIAGNOSIS — R0602 Shortness of breath: Secondary | ICD-10-CM | POA: Diagnosis not present

## 2012-09-12 DIAGNOSIS — I10 Essential (primary) hypertension: Secondary | ICD-10-CM | POA: Diagnosis not present

## 2012-09-12 DIAGNOSIS — Z23 Encounter for immunization: Secondary | ICD-10-CM | POA: Diagnosis not present

## 2012-09-13 DIAGNOSIS — I10 Essential (primary) hypertension: Secondary | ICD-10-CM | POA: Diagnosis not present

## 2012-09-15 DIAGNOSIS — E7800 Pure hypercholesterolemia, unspecified: Secondary | ICD-10-CM | POA: Diagnosis not present

## 2012-10-08 DIAGNOSIS — J069 Acute upper respiratory infection, unspecified: Secondary | ICD-10-CM | POA: Diagnosis not present

## 2012-10-10 DIAGNOSIS — B351 Tinea unguium: Secondary | ICD-10-CM | POA: Diagnosis not present

## 2012-10-10 DIAGNOSIS — M79609 Pain in unspecified limb: Secondary | ICD-10-CM | POA: Diagnosis not present

## 2012-10-24 DIAGNOSIS — F4323 Adjustment disorder with mixed anxiety and depressed mood: Secondary | ICD-10-CM | POA: Diagnosis not present

## 2012-10-24 DIAGNOSIS — F329 Major depressive disorder, single episode, unspecified: Secondary | ICD-10-CM | POA: Diagnosis not present

## 2012-10-28 DIAGNOSIS — R748 Abnormal levels of other serum enzymes: Secondary | ICD-10-CM | POA: Diagnosis not present

## 2012-11-15 ENCOUNTER — Emergency Department: Payer: Self-pay | Admitting: Emergency Medicine

## 2012-11-15 DIAGNOSIS — R05 Cough: Secondary | ICD-10-CM | POA: Diagnosis not present

## 2012-11-15 DIAGNOSIS — I10 Essential (primary) hypertension: Secondary | ICD-10-CM | POA: Diagnosis not present

## 2012-11-15 LAB — COMPREHENSIVE METABOLIC PANEL
Albumin: 3.8 g/dL (ref 3.4–5.0)
Alkaline Phosphatase: 62 U/L (ref 50–136)
Calcium, Total: 9 mg/dL (ref 8.5–10.1)
Creatinine: 0.62 mg/dL (ref 0.60–1.30)
EGFR (Non-African Amer.): >60
Glucose: 77 mg/dL (ref 65–99)
Osmolality: 279 (ref 275–301)
SGOT(AST): 36 U/L (ref 15–37)
Total Protein: 7.4 g/dL (ref 6.4–8.2)

## 2012-11-15 LAB — CBC WITH DIFFERENTIAL/PLATELET
Basophil #: 0 10*3/uL (ref 0.0–0.1)
Eosinophil #: 0.2 10*3/uL (ref 0.0–0.7)
Eosinophil %: 5 %
HCT: 36.9 % (ref 35.0–47.0)
Lymphocyte #: 1.3 10*3/uL (ref 1.0–3.6)
MCH: 35.2 pg — ABNORMAL HIGH (ref 26.0–34.0)
MCHC: 34.5 g/dL (ref 32.0–36.0)
MCV: 102 fL — ABNORMAL HIGH (ref 80–100)
Monocyte #: 0.5 x10 3/mm (ref 0.2–0.9)
Neutrophil %: 53.5 %
Platelet: 226 10*3/uL (ref 150–440)
RBC: 3.62 10*6/uL — ABNORMAL LOW (ref 3.80–5.20)
RDW: 12.8 % (ref 11.5–14.5)

## 2012-11-15 LAB — TROPONIN I: Troponin-I: 0.14 ng/mL — ABNORMAL HIGH

## 2012-11-21 DIAGNOSIS — I471 Supraventricular tachycardia: Secondary | ICD-10-CM | POA: Diagnosis not present

## 2012-11-21 DIAGNOSIS — I10 Essential (primary) hypertension: Secondary | ICD-10-CM | POA: Diagnosis not present

## 2012-11-21 DIAGNOSIS — R002 Palpitations: Secondary | ICD-10-CM | POA: Diagnosis not present

## 2012-11-28 DIAGNOSIS — R05 Cough: Secondary | ICD-10-CM | POA: Diagnosis not present

## 2012-11-28 DIAGNOSIS — Z23 Encounter for immunization: Secondary | ICD-10-CM | POA: Diagnosis not present

## 2012-11-28 DIAGNOSIS — J019 Acute sinusitis, unspecified: Secondary | ICD-10-CM | POA: Diagnosis not present

## 2012-11-29 DIAGNOSIS — R002 Palpitations: Secondary | ICD-10-CM | POA: Diagnosis not present

## 2012-12-08 DIAGNOSIS — R0602 Shortness of breath: Secondary | ICD-10-CM | POA: Diagnosis not present

## 2012-12-24 ENCOUNTER — Encounter: Payer: Self-pay | Admitting: *Deleted

## 2012-12-24 DIAGNOSIS — M1990 Unspecified osteoarthritis, unspecified site: Secondary | ICD-10-CM | POA: Insufficient documentation

## 2013-01-03 DIAGNOSIS — I69991 Dysphagia following unspecified cerebrovascular disease: Secondary | ICD-10-CM | POA: Diagnosis not present

## 2013-01-05 DIAGNOSIS — M23319 Other meniscus derangements, anterior horn of medial meniscus, unspecified knee: Secondary | ICD-10-CM | POA: Diagnosis not present

## 2013-01-19 ENCOUNTER — Ambulatory Visit: Payer: Self-pay | Admitting: General Surgery

## 2013-01-23 DIAGNOSIS — Z23 Encounter for immunization: Secondary | ICD-10-CM | POA: Diagnosis not present

## 2013-01-23 DIAGNOSIS — Z13820 Encounter for screening for osteoporosis: Secondary | ICD-10-CM | POA: Diagnosis not present

## 2013-02-06 ENCOUNTER — Ambulatory Visit: Payer: Self-pay | Admitting: Family Medicine

## 2013-02-06 DIAGNOSIS — M899 Disorder of bone, unspecified: Secondary | ICD-10-CM | POA: Diagnosis not present

## 2013-02-09 ENCOUNTER — Ambulatory Visit (INDEPENDENT_AMBULATORY_CARE_PROVIDER_SITE_OTHER): Payer: Medicare Other | Admitting: General Surgery

## 2013-02-09 ENCOUNTER — Encounter: Payer: Self-pay | Admitting: General Surgery

## 2013-02-09 VITALS — BP 142/74 | HR 78 | Resp 14 | Ht 60.0 in | Wt 123.0 lb

## 2013-02-09 DIAGNOSIS — N6019 Diffuse cystic mastopathy of unspecified breast: Secondary | ICD-10-CM | POA: Diagnosis not present

## 2013-02-09 DIAGNOSIS — Z1231 Encounter for screening mammogram for malignant neoplasm of breast: Secondary | ICD-10-CM | POA: Diagnosis not present

## 2013-02-09 DIAGNOSIS — Z803 Family history of malignant neoplasm of breast: Secondary | ICD-10-CM | POA: Diagnosis not present

## 2013-02-09 NOTE — Progress Notes (Signed)
Patient ID: Maria Bush, female   DOB: 1930/03/04, 79 y.o.   MRN: [id number]  Chief Complaint  Patient presents with  . Follow-up    mammogram    HPI Maria Bush is a 79 y.o. female.  Patient here today for follow up mammogram, no new breast issues.  She has a known history of fibrocystic breast disease. She has a sister with breast cancer. HPI  Past Medical History  Diagnosis Date  . Hypertension   . Heart murmur 2005  . Arthritis 2005    Past Surgical History  Procedure Laterality Date  . Cardiac catheterization  2011  . Colonoscopy  1982  . Abdominal hysterectomy  1981  . Bladder surgery  1992  . Cholecystectomy  2001  . Rectocele repair  1994    Family History  Problem Relation Age of Onset  . Breast cancer Sister     Social History History  Substance Use Topics  . Smoking status: Never Smoker   . Smokeless tobacco: Never Used  . Alcohol Use: No    Allergies  Allergen Reactions  . Prednisone Nausea And Vomiting  . Sulfa Antibiotics Itching  . Penicillins Rash    Current Outpatient Prescriptions  Medication Sig Dispense Refill  . amLODipine (NORVASC) 5 MG tablet       . aspirin 81 MG tablet Take 81 mg by mouth daily.      Marland Kitchen atenolol (TENORMIN) 25 MG tablet       . fish oil-omega-3 fatty acids 1000 MG capsule Take 2 g by mouth daily.      Marland Kitchen lisinopril (PRINIVIL,ZESTRIL) 10 MG tablet Take 10 mg by mouth daily.      . meloxicam (MOBIC) 15 MG tablet       . mirtazapine (REMERON) 30 MG tablet       . Multiple Minerals-Vitamins (CALCIUM & VIT D3 BONE HEALTH PO) Take 1 tablet by mouth daily.      Marland Kitchen omeprazole (PRILOSEC) 40 MG capsule        No current facility-administered medications for this visit.    Review of Systems Review of Systems  Constitutional: Negative.   Respiratory: Negative.   Cardiovascular: Negative.   Neurological: Positive for headaches.    Blood pressure 142/74, pulse 78, resp. rate 14, height 5' (1.524 m), weight 123 lb  (55.792 kg).  Physical Exam Physical Exam  Constitutional: She appears well-developed and well-nourished.  Eyes: No scleral icterus.  Neck: Normal range of motion. Neck supple.  Cardiovascular: Normal rate, regular rhythm and normal heart sounds.   Pulmonary/Chest: Effort normal and breath sounds normal. Right breast exhibits no inverted nipple, no mass, no nipple discharge, no skin change and no tenderness. Left breast exhibits no inverted nipple, no mass, no nipple discharge, no skin change and no tenderness. Breasts are symmetrical.  Lymphadenopathy:    She has no cervical adenopathy.    She has no axillary adenopathy.    Data Reviewed Mammogram reviewed Birads 2  Assessment    Stable exam.     Plan    Routine f/u in 1 yr        Currie Paris 02/09/2013, 2:47 PM

## 2013-02-09 NOTE — Patient Instructions (Signed)
Patient will be asked to return to the office in one year for a bilateral screening mammogram. 

## 2013-02-10 ENCOUNTER — Encounter: Payer: Self-pay | Admitting: General Surgery

## 2013-02-10 DIAGNOSIS — Z803 Family history of malignant neoplasm of breast: Secondary | ICD-10-CM | POA: Insufficient documentation

## 2013-02-10 DIAGNOSIS — N6019 Diffuse cystic mastopathy of unspecified breast: Secondary | ICD-10-CM | POA: Insufficient documentation

## 2013-02-14 ENCOUNTER — Encounter: Payer: Self-pay | Admitting: General Surgery

## 2013-02-22 DIAGNOSIS — H409 Unspecified glaucoma: Secondary | ICD-10-CM | POA: Diagnosis not present

## 2013-02-22 DIAGNOSIS — H4011X0 Primary open-angle glaucoma, stage unspecified: Secondary | ICD-10-CM | POA: Diagnosis not present

## 2013-03-07 DIAGNOSIS — I10 Essential (primary) hypertension: Secondary | ICD-10-CM | POA: Diagnosis not present

## 2013-03-07 DIAGNOSIS — I2510 Atherosclerotic heart disease of native coronary artery without angina pectoris: Secondary | ICD-10-CM | POA: Diagnosis not present

## 2013-03-20 DIAGNOSIS — K5900 Constipation, unspecified: Secondary | ICD-10-CM | POA: Diagnosis not present

## 2013-04-12 DIAGNOSIS — N8111 Cystocele, midline: Secondary | ICD-10-CM | POA: Diagnosis not present

## 2013-04-12 DIAGNOSIS — R350 Frequency of micturition: Secondary | ICD-10-CM | POA: Diagnosis not present

## 2013-04-12 DIAGNOSIS — N819 Female genital prolapse, unspecified: Secondary | ICD-10-CM | POA: Diagnosis not present

## 2013-04-12 DIAGNOSIS — N9489 Other specified conditions associated with female genital organs and menstrual cycle: Secondary | ICD-10-CM | POA: Diagnosis not present

## 2013-04-12 DIAGNOSIS — N952 Postmenopausal atrophic vaginitis: Secondary | ICD-10-CM | POA: Diagnosis not present

## 2013-04-17 DIAGNOSIS — Z13820 Encounter for screening for osteoporosis: Secondary | ICD-10-CM | POA: Diagnosis not present

## 2013-04-17 DIAGNOSIS — M25559 Pain in unspecified hip: Secondary | ICD-10-CM | POA: Diagnosis not present

## 2013-04-17 DIAGNOSIS — Z23 Encounter for immunization: Secondary | ICD-10-CM | POA: Diagnosis not present

## 2013-04-19 DIAGNOSIS — K5730 Diverticulosis of large intestine without perforation or abscess without bleeding: Secondary | ICD-10-CM | POA: Diagnosis not present

## 2013-04-19 DIAGNOSIS — Z1211 Encounter for screening for malignant neoplasm of colon: Secondary | ICD-10-CM | POA: Diagnosis not present

## 2013-04-20 ENCOUNTER — Ambulatory Visit: Payer: Self-pay | Admitting: Emergency Medicine

## 2013-04-20 DIAGNOSIS — Q438 Other specified congenital malformations of intestine: Secondary | ICD-10-CM | POA: Diagnosis not present

## 2013-04-20 DIAGNOSIS — K639 Disease of intestine, unspecified: Secondary | ICD-10-CM | POA: Diagnosis not present

## 2013-04-20 DIAGNOSIS — K5730 Diverticulosis of large intestine without perforation or abscess without bleeding: Secondary | ICD-10-CM | POA: Diagnosis not present

## 2013-04-20 DIAGNOSIS — Z1211 Encounter for screening for malignant neoplasm of colon: Secondary | ICD-10-CM | POA: Diagnosis not present

## 2013-05-17 DIAGNOSIS — R109 Unspecified abdominal pain: Secondary | ICD-10-CM | POA: Diagnosis not present

## 2013-05-17 DIAGNOSIS — R197 Diarrhea, unspecified: Secondary | ICD-10-CM | POA: Diagnosis not present

## 2013-05-17 DIAGNOSIS — R110 Nausea: Secondary | ICD-10-CM | POA: Diagnosis not present

## 2013-05-26 DIAGNOSIS — R51 Headache: Secondary | ICD-10-CM | POA: Diagnosis not present

## 2013-05-26 DIAGNOSIS — R6883 Chills (without fever): Secondary | ICD-10-CM | POA: Diagnosis not present

## 2013-05-26 DIAGNOSIS — K5900 Constipation, unspecified: Secondary | ICD-10-CM | POA: Diagnosis not present

## 2013-05-26 DIAGNOSIS — Z23 Encounter for immunization: Secondary | ICD-10-CM | POA: Diagnosis not present

## 2013-07-03 DIAGNOSIS — R51 Headache: Secondary | ICD-10-CM | POA: Diagnosis not present

## 2013-07-03 DIAGNOSIS — K5900 Constipation, unspecified: Secondary | ICD-10-CM | POA: Diagnosis not present

## 2013-07-03 DIAGNOSIS — Z23 Encounter for immunization: Secondary | ICD-10-CM | POA: Diagnosis not present

## 2013-07-03 DIAGNOSIS — R6883 Chills (without fever): Secondary | ICD-10-CM | POA: Diagnosis not present

## 2013-07-29 ENCOUNTER — Emergency Department: Payer: Self-pay | Admitting: Emergency Medicine

## 2013-07-29 DIAGNOSIS — R1031 Right lower quadrant pain: Secondary | ICD-10-CM | POA: Diagnosis not present

## 2013-07-29 DIAGNOSIS — K2900 Acute gastritis without bleeding: Secondary | ICD-10-CM | POA: Diagnosis not present

## 2013-07-29 DIAGNOSIS — R109 Unspecified abdominal pain: Secondary | ICD-10-CM | POA: Diagnosis not present

## 2013-07-29 DIAGNOSIS — I10 Essential (primary) hypertension: Secondary | ICD-10-CM | POA: Diagnosis not present

## 2013-07-29 DIAGNOSIS — K2970 Gastritis, unspecified, without bleeding: Secondary | ICD-10-CM | POA: Diagnosis not present

## 2013-07-29 DIAGNOSIS — K219 Gastro-esophageal reflux disease without esophagitis: Secondary | ICD-10-CM | POA: Diagnosis not present

## 2013-07-29 DIAGNOSIS — Z79899 Other long term (current) drug therapy: Secondary | ICD-10-CM | POA: Diagnosis not present

## 2013-07-29 LAB — URINALYSIS, COMPLETE
Blood: NEGATIVE
Ketone: NEGATIVE
Leukocyte Esterase: NEGATIVE
Nitrite: NEGATIVE
Ph: 8 (ref 4.5–8.0)
Protein: NEGATIVE
Specific Gravity: 1.005 (ref 1.003–1.030)
Squamous Epithelial: NONE SEEN
WBC UR: <1 /HPF (ref 0–5)

## 2013-07-29 LAB — COMPREHENSIVE METABOLIC PANEL
Albumin: 3.6 g/dL (ref 3.4–5.0)
Anion Gap: 4 — ABNORMAL LOW (ref 7–16)
Bilirubin,Total: 0.7 mg/dL (ref 0.2–1.0)
Calcium, Total: 9.2 mg/dL (ref 8.5–10.1)
Co2: 29 mmol/L (ref 21–32)
Creatinine: 0.65 mg/dL (ref 0.60–1.30)
EGFR (African American): >60
EGFR (Non-African Amer.): >60
Osmolality: 270 (ref 275–301)
Potassium: 4.4 mmol/L (ref 3.5–5.1)
Sodium: 134 mmol/L — ABNORMAL LOW (ref 136–145)
Total Protein: 7.1 g/dL (ref 6.4–8.2)

## 2013-07-29 LAB — CBC
HGB: 12.7 g/dL (ref 12.0–16.0)
MCH: 34 pg (ref 26.0–34.0)
MCHC: 33.8 g/dL (ref 32.0–36.0)
MCV: 101 fL — ABNORMAL HIGH (ref 80–100)
Platelet: 236 10*3/uL (ref 150–440)

## 2013-08-02 DIAGNOSIS — D709 Neutropenia, unspecified: Secondary | ICD-10-CM | POA: Diagnosis not present

## 2013-08-02 DIAGNOSIS — K2970 Gastritis, unspecified, without bleeding: Secondary | ICD-10-CM | POA: Diagnosis not present

## 2013-08-02 DIAGNOSIS — R634 Abnormal weight loss: Secondary | ICD-10-CM | POA: Diagnosis not present

## 2013-08-02 DIAGNOSIS — Z23 Encounter for immunization: Secondary | ICD-10-CM | POA: Diagnosis not present

## 2013-08-09 DIAGNOSIS — K2970 Gastritis, unspecified, without bleeding: Secondary | ICD-10-CM | POA: Diagnosis not present

## 2013-08-09 DIAGNOSIS — K5900 Constipation, unspecified: Secondary | ICD-10-CM | POA: Diagnosis not present

## 2013-08-23 DIAGNOSIS — I10 Essential (primary) hypertension: Secondary | ICD-10-CM | POA: Diagnosis not present

## 2013-08-30 DIAGNOSIS — H2510 Age-related nuclear cataract, unspecified eye: Secondary | ICD-10-CM | POA: Diagnosis not present

## 2013-08-30 DIAGNOSIS — H4011X0 Primary open-angle glaucoma, stage unspecified: Secondary | ICD-10-CM | POA: Diagnosis not present

## 2013-08-30 DIAGNOSIS — H409 Unspecified glaucoma: Secondary | ICD-10-CM | POA: Diagnosis not present

## 2013-09-01 DIAGNOSIS — N993 Prolapse of vaginal vault after hysterectomy: Secondary | ICD-10-CM | POA: Insufficient documentation

## 2013-09-01 DIAGNOSIS — N8111 Cystocele, midline: Secondary | ICD-10-CM | POA: Insufficient documentation

## 2013-09-01 DIAGNOSIS — N3941 Urge incontinence: Secondary | ICD-10-CM | POA: Insufficient documentation

## 2013-09-01 DIAGNOSIS — R339 Retention of urine, unspecified: Secondary | ICD-10-CM | POA: Insufficient documentation

## 2013-09-01 DIAGNOSIS — R3989 Other symptoms and signs involving the genitourinary system: Secondary | ICD-10-CM | POA: Insufficient documentation

## 2013-09-12 DIAGNOSIS — R3989 Other symptoms and signs involving the genitourinary system: Secondary | ICD-10-CM | POA: Diagnosis not present

## 2013-09-12 DIAGNOSIS — N8111 Cystocele, midline: Secondary | ICD-10-CM | POA: Diagnosis not present

## 2013-09-12 DIAGNOSIS — R339 Retention of urine, unspecified: Secondary | ICD-10-CM | POA: Diagnosis not present

## 2013-09-28 DIAGNOSIS — R634 Abnormal weight loss: Secondary | ICD-10-CM | POA: Diagnosis not present

## 2013-09-28 DIAGNOSIS — K2970 Gastritis, unspecified, without bleeding: Secondary | ICD-10-CM | POA: Diagnosis not present

## 2013-09-28 DIAGNOSIS — Z23 Encounter for immunization: Secondary | ICD-10-CM | POA: Diagnosis not present

## 2013-09-28 DIAGNOSIS — D709 Neutropenia, unspecified: Secondary | ICD-10-CM | POA: Diagnosis not present

## 2013-10-24 DIAGNOSIS — F329 Major depressive disorder, single episode, unspecified: Secondary | ICD-10-CM | POA: Diagnosis not present

## 2013-10-30 DIAGNOSIS — R6883 Chills (without fever): Secondary | ICD-10-CM | POA: Diagnosis not present

## 2013-10-30 DIAGNOSIS — Z23 Encounter for immunization: Secondary | ICD-10-CM | POA: Diagnosis not present

## 2013-10-30 DIAGNOSIS — R7989 Other specified abnormal findings of blood chemistry: Secondary | ICD-10-CM | POA: Diagnosis not present

## 2013-10-30 DIAGNOSIS — D709 Neutropenia, unspecified: Secondary | ICD-10-CM | POA: Diagnosis not present

## 2013-10-31 DIAGNOSIS — H4010X0 Unspecified open-angle glaucoma, stage unspecified: Secondary | ICD-10-CM | POA: Diagnosis not present

## 2013-10-31 DIAGNOSIS — H18419 Arcus senilis, unspecified eye: Secondary | ICD-10-CM | POA: Diagnosis not present

## 2013-10-31 DIAGNOSIS — H25049 Posterior subcapsular polar age-related cataract, unspecified eye: Secondary | ICD-10-CM | POA: Diagnosis not present

## 2013-10-31 DIAGNOSIS — H2510 Age-related nuclear cataract, unspecified eye: Secondary | ICD-10-CM | POA: Diagnosis not present

## 2013-10-31 DIAGNOSIS — H25019 Cortical age-related cataract, unspecified eye: Secondary | ICD-10-CM | POA: Diagnosis not present

## 2013-10-31 DIAGNOSIS — H02839 Dermatochalasis of unspecified eye, unspecified eyelid: Secondary | ICD-10-CM | POA: Diagnosis not present

## 2013-11-01 DIAGNOSIS — K2970 Gastritis, unspecified, without bleeding: Secondary | ICD-10-CM | POA: Diagnosis not present

## 2013-11-01 DIAGNOSIS — R109 Unspecified abdominal pain: Secondary | ICD-10-CM | POA: Diagnosis not present

## 2013-11-14 DIAGNOSIS — R109 Unspecified abdominal pain: Secondary | ICD-10-CM | POA: Diagnosis not present

## 2013-11-14 DIAGNOSIS — R933 Abnormal findings on diagnostic imaging of other parts of digestive tract: Secondary | ICD-10-CM | POA: Diagnosis not present

## 2013-11-14 DIAGNOSIS — R1013 Epigastric pain: Secondary | ICD-10-CM | POA: Diagnosis not present

## 2013-11-16 HISTORY — PX: CATARACT EXTRACTION: SUR2

## 2013-12-04 ENCOUNTER — Inpatient Hospital Stay: Payer: Self-pay | Admitting: Internal Medicine

## 2013-12-04 DIAGNOSIS — K921 Melena: Secondary | ICD-10-CM | POA: Diagnosis not present

## 2013-12-04 DIAGNOSIS — K5733 Diverticulitis of large intestine without perforation or abscess with bleeding: Secondary | ICD-10-CM | POA: Diagnosis not present

## 2013-12-04 DIAGNOSIS — I10 Essential (primary) hypertension: Secondary | ICD-10-CM | POA: Diagnosis not present

## 2013-12-04 DIAGNOSIS — K922 Gastrointestinal hemorrhage, unspecified: Secondary | ICD-10-CM | POA: Diagnosis not present

## 2013-12-04 DIAGNOSIS — Z823 Family history of stroke: Secondary | ICD-10-CM | POA: Diagnosis not present

## 2013-12-04 DIAGNOSIS — Z882 Allergy status to sulfonamides status: Secondary | ICD-10-CM | POA: Diagnosis not present

## 2013-12-04 DIAGNOSIS — K625 Hemorrhage of anus and rectum: Secondary | ICD-10-CM | POA: Diagnosis not present

## 2013-12-04 DIAGNOSIS — R748 Abnormal levels of other serum enzymes: Secondary | ICD-10-CM | POA: Diagnosis not present

## 2013-12-04 DIAGNOSIS — Z7982 Long term (current) use of aspirin: Secondary | ICD-10-CM | POA: Diagnosis not present

## 2013-12-04 DIAGNOSIS — I2510 Atherosclerotic heart disease of native coronary artery without angina pectoris: Secondary | ICD-10-CM | POA: Diagnosis not present

## 2013-12-04 DIAGNOSIS — K5730 Diverticulosis of large intestine without perforation or abscess without bleeding: Secondary | ICD-10-CM | POA: Diagnosis not present

## 2013-12-04 DIAGNOSIS — E871 Hypo-osmolality and hyponatremia: Secondary | ICD-10-CM | POA: Diagnosis not present

## 2013-12-04 DIAGNOSIS — M1990 Unspecified osteoarthritis, unspecified site: Secondary | ICD-10-CM | POA: Diagnosis present

## 2013-12-04 DIAGNOSIS — Z880 Allergy status to penicillin: Secondary | ICD-10-CM | POA: Diagnosis not present

## 2013-12-04 DIAGNOSIS — K219 Gastro-esophageal reflux disease without esophagitis: Secondary | ICD-10-CM | POA: Diagnosis not present

## 2013-12-04 DIAGNOSIS — D649 Anemia, unspecified: Secondary | ICD-10-CM | POA: Diagnosis not present

## 2013-12-04 DIAGNOSIS — I248 Other forms of acute ischemic heart disease: Secondary | ICD-10-CM | POA: Diagnosis not present

## 2013-12-04 LAB — COMPREHENSIVE METABOLIC PANEL
ALK PHOS: 49 U/L
Albumin: 3.4 g/dL (ref 3.4–5.0)
Anion Gap: 5 — ABNORMAL LOW (ref 7–16)
BILIRUBIN TOTAL: 0.7 mg/dL (ref 0.2–1.0)
BUN: 18 mg/dL (ref 7–18)
CO2: 27 mmol/L (ref 21–32)
Calcium, Total: 9.3 mg/dL (ref 8.5–10.1)
Chloride: 103 mmol/L (ref 98–107)
Creatinine: 0.59 mg/dL — ABNORMAL LOW (ref 0.60–1.30)
EGFR (African American): >60
Glucose: 77 mg/dL (ref 65–99)
OSMOLALITY: 271 (ref 275–301)
Potassium: 3.8 mmol/L (ref 3.5–5.1)
SGOT(AST): 35 U/L (ref 15–37)
SGPT (ALT): 31 U/L (ref 12–78)
SODIUM: 135 mmol/L — AB (ref 136–145)
Total Protein: 6.8 g/dL (ref 6.4–8.2)

## 2013-12-04 LAB — CBC WITH DIFFERENTIAL/PLATELET
Basophil #: 0.1 10*3/uL (ref 0.0–0.1)
Basophil %: 1.2 %
Eosinophil #: 0.1 10*3/uL (ref 0.0–0.7)
Eosinophil %: 2.2 %
HCT: 33.4 % — ABNORMAL LOW (ref 35.0–47.0)
HGB: 11.4 g/dL — ABNORMAL LOW (ref 12.0–16.0)
LYMPHS ABS: 1.3 10*3/uL (ref 1.0–3.6)
Lymphocyte %: 26.8 %
MCH: 34.5 pg — ABNORMAL HIGH (ref 26.0–34.0)
MCHC: 34 g/dL (ref 32.0–36.0)
MCV: 102 fL — AB (ref 80–100)
MONOS PCT: 7.3 %
Monocyte #: 0.4 x10 3/mm (ref 0.2–0.9)
Neutrophil #: 3.1 10*3/uL (ref 1.4–6.5)
Neutrophil %: 62.5 %
Platelet: 234 10*3/uL (ref 150–440)
RBC: 3.29 10*6/uL — ABNORMAL LOW (ref 3.80–5.20)
RDW: 12.5 % (ref 11.5–14.5)
WBC: 5 10*3/uL (ref 3.6–11.0)

## 2013-12-04 LAB — TROPONIN I: Troponin-I: 0.4 ng/mL — ABNORMAL HIGH

## 2013-12-04 LAB — CK-MB: CK-MB: 8.2 ng/mL — ABNORMAL HIGH (ref 0.5–3.6)

## 2013-12-05 DIAGNOSIS — I10 Essential (primary) hypertension: Secondary | ICD-10-CM | POA: Diagnosis not present

## 2013-12-05 DIAGNOSIS — K922 Gastrointestinal hemorrhage, unspecified: Secondary | ICD-10-CM | POA: Diagnosis not present

## 2013-12-05 LAB — HEMOGLOBIN
HGB: 11 g/dL — ABNORMAL LOW (ref 12.0–16.0)
HGB: 11.3 g/dL — ABNORMAL LOW (ref 12.0–16.0)
HGB: 11.3 g/dL — ABNORMAL LOW (ref 12.0–16.0)

## 2013-12-05 LAB — CK TOTAL AND CKMB (NOT AT ARMC)
CK, TOTAL: 180 U/L (ref 21–215)
CK, Total: 157 U/L (ref 21–215)
CK-MB: 5.2 ng/mL — ABNORMAL HIGH (ref 0.5–3.6)
CK-MB: 4.8 ng/mL — AB (ref 0.5–3.6)

## 2013-12-05 LAB — CK-MB: CK-MB: 5.2 ng/mL — AB (ref 0.5–3.6)

## 2013-12-05 LAB — TROPONIN I
TROPONIN-I: 0.39 ng/mL — AB
Troponin-I: 0.38 ng/mL — ABNORMAL HIGH

## 2013-12-06 LAB — HEMOGLOBIN: HGB: 11.2 g/dL — ABNORMAL LOW (ref 12.0–16.0)

## 2013-12-07 LAB — HEMOGLOBIN: HGB: 11.9 g/dL — ABNORMAL LOW (ref 12.0–16.0)

## 2013-12-09 DIAGNOSIS — R1032 Left lower quadrant pain: Secondary | ICD-10-CM | POA: Diagnosis not present

## 2013-12-22 DIAGNOSIS — K5730 Diverticulosis of large intestine without perforation or abscess without bleeding: Secondary | ICD-10-CM | POA: Diagnosis not present

## 2013-12-22 DIAGNOSIS — D709 Neutropenia, unspecified: Secondary | ICD-10-CM | POA: Diagnosis not present

## 2013-12-22 DIAGNOSIS — D649 Anemia, unspecified: Secondary | ICD-10-CM | POA: Diagnosis not present

## 2013-12-22 DIAGNOSIS — Z23 Encounter for immunization: Secondary | ICD-10-CM | POA: Diagnosis not present

## 2013-12-27 DIAGNOSIS — D649 Anemia, unspecified: Secondary | ICD-10-CM | POA: Diagnosis not present

## 2014-01-03 IMAGING — CR DG BE W/ AIR HIGH DENSITY
1 series · 12 of 13 positions shown · non-contrast
Comparison: none

REASON FOR EXAM: incomplete colonoscopy
COMMENTS:

[Series 1: scout · 0.17mm/px · 12 of 13 slices shown]
[im 1/13]
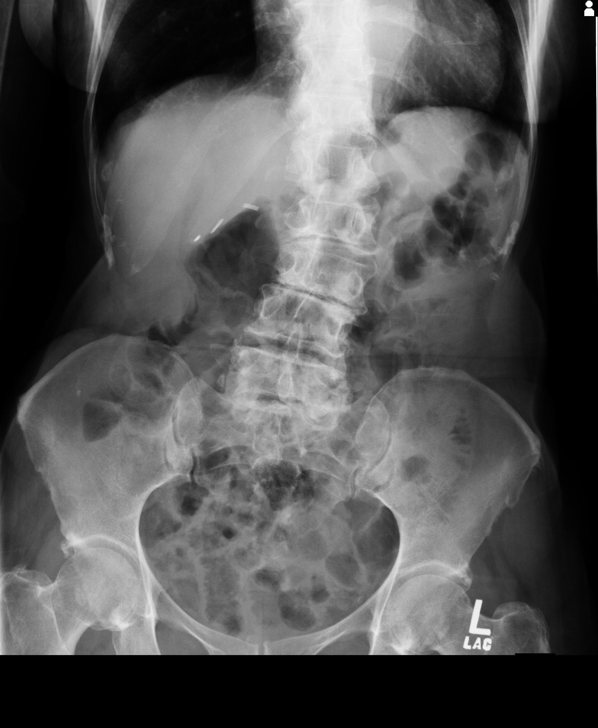
[im 2/13]
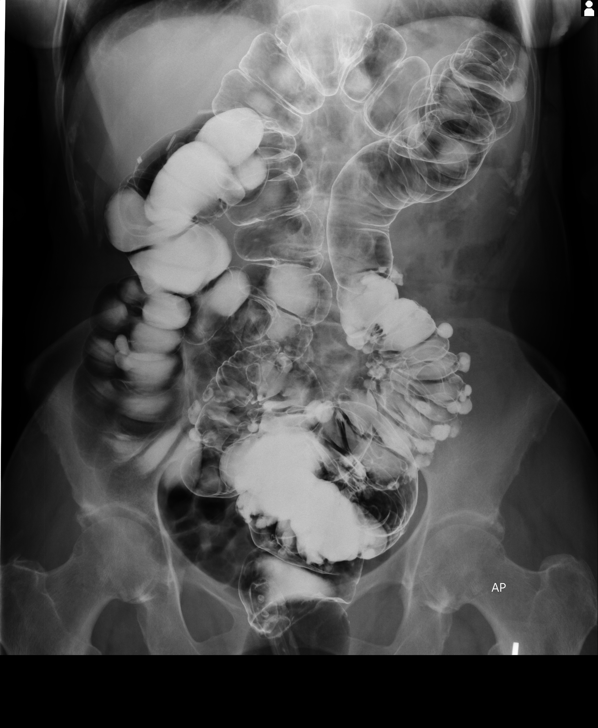
[im 3/13]
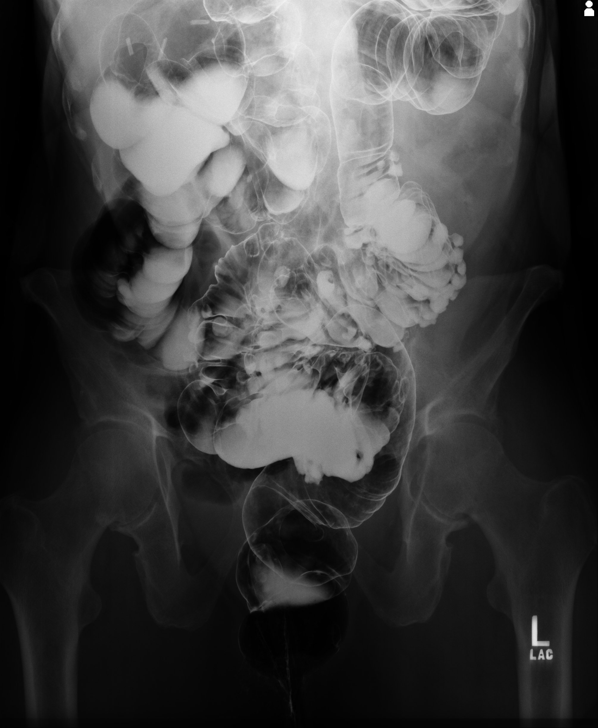
[im 4/13]
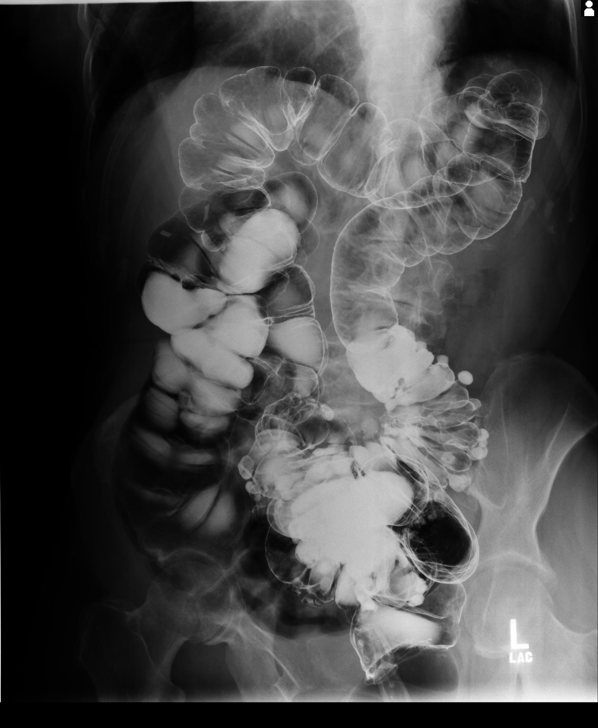
[im 5/13]
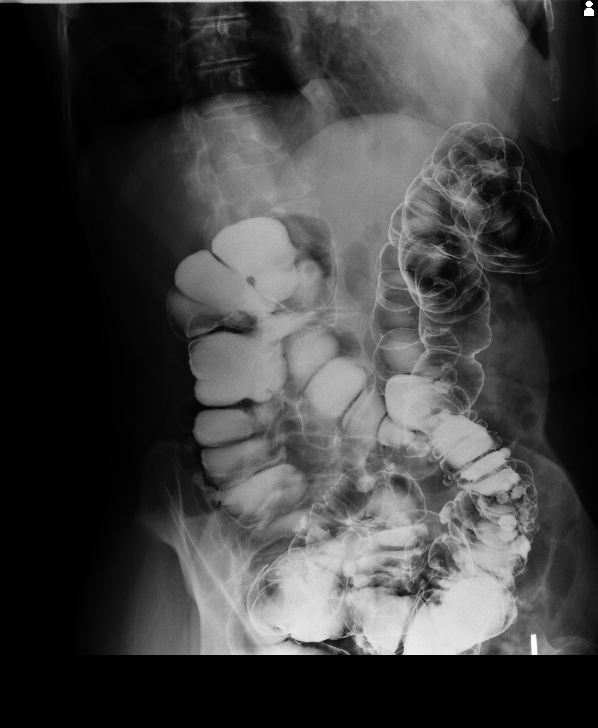
[im 6/13]
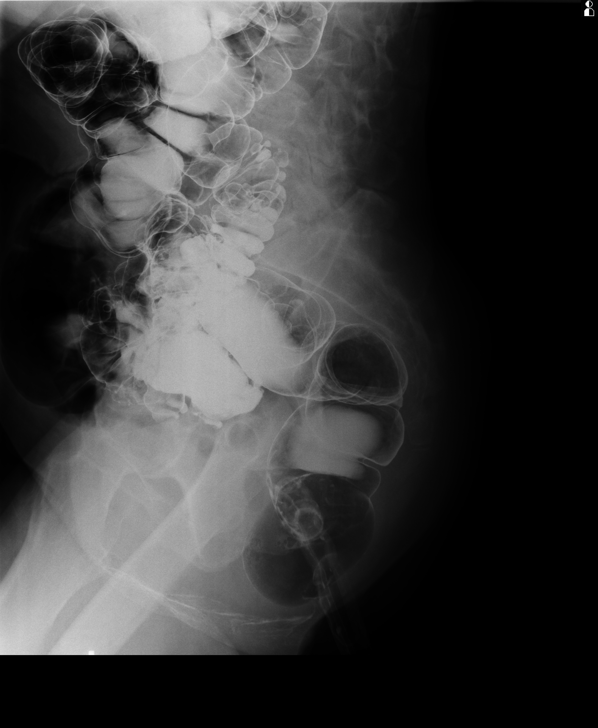
[im 8/13]
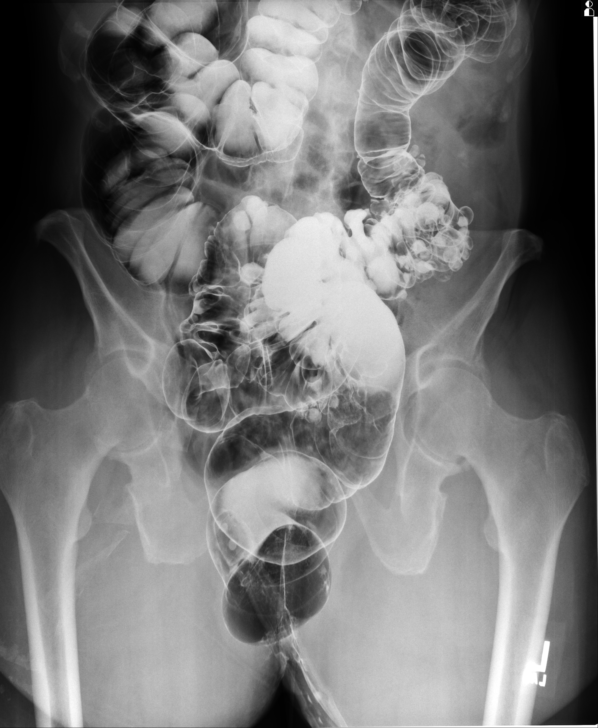
[im 9/13]
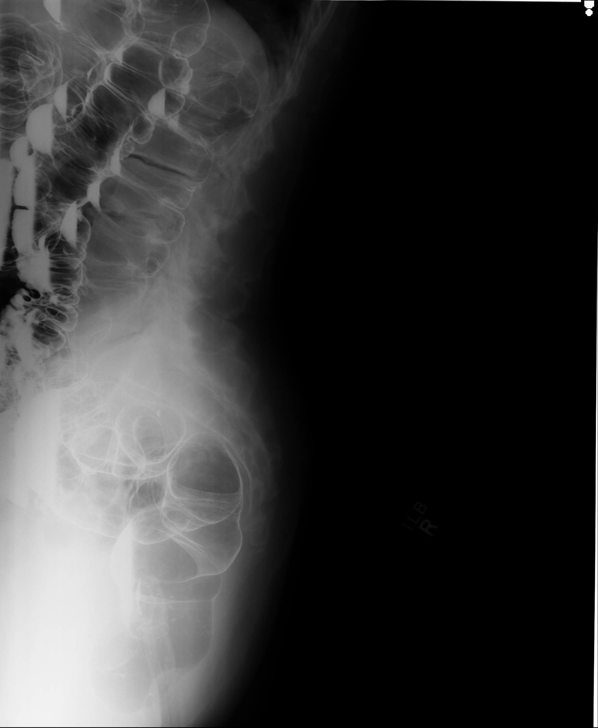
[im 10/13]
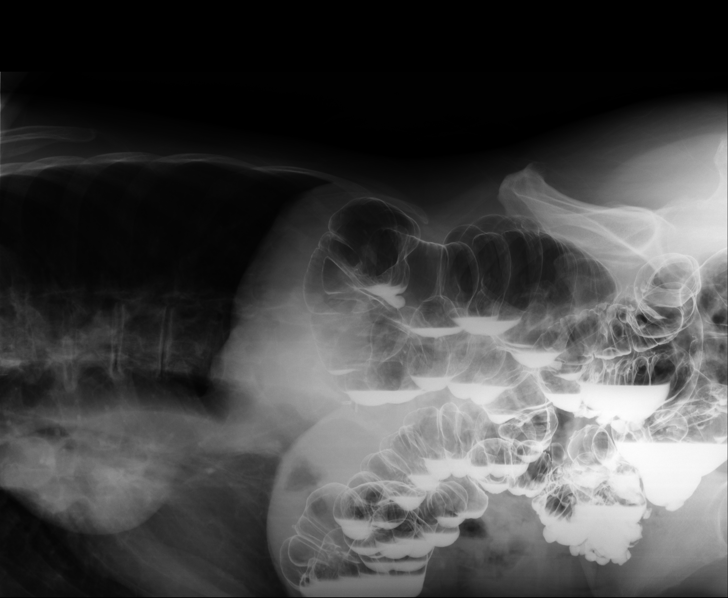
[im 11/13]
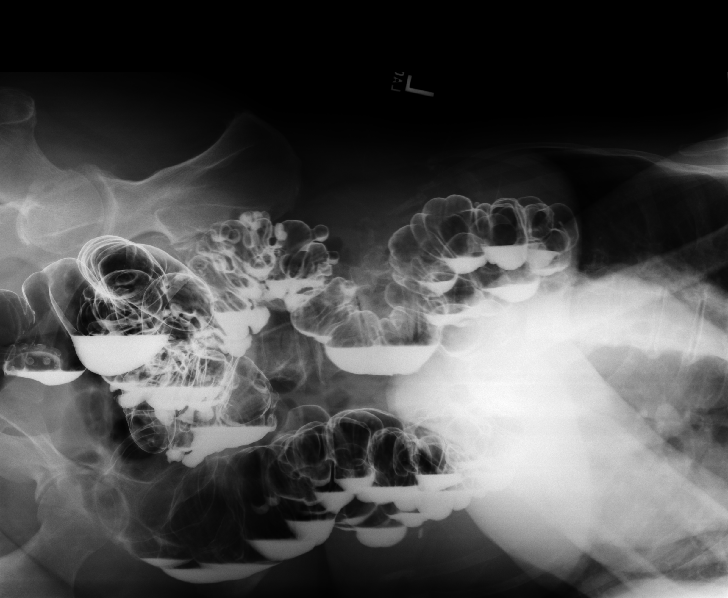
[im 12/13]
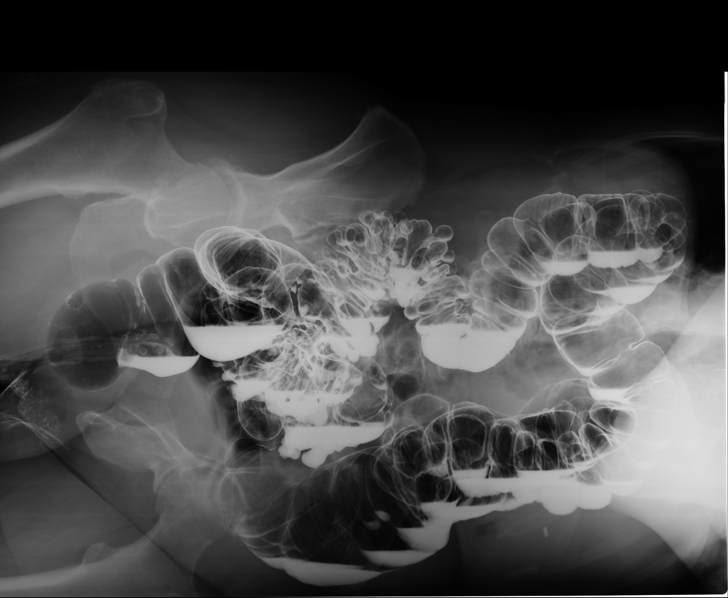
[im 13/13]
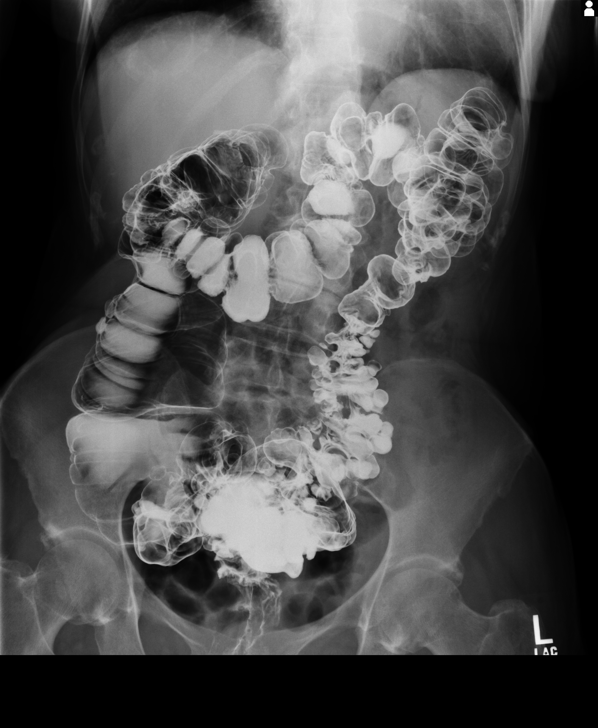

[12 of 13 positions shown; findings below may reference images not displayed]

PROCEDURE:     FL  - FL BARIUM ENEMA WITH AIR COLON  - April 20, 2013 [DATE]

RESULT:     Scout view shows mild scoliotic curvature concave to the right
centered at L1-L2. There is a moderate amount of air scattered through the
colon. There appear to be some changes of residual fecal material. The
patient was placed in a prone position on the fluoroscopic table after being
told about the exam and having the opportunity to ask questions and have or
questions answered. The patient was placed in a LAO position with the enema
tip placed by the radiologic technologist. Air was placed in the retention
balloon. Barium was instilled through the tip in the transverse colon.
Excess barium was drained and air was insufflated through the tip. The colon
distends normally. There is extensive sigmoid colon diverticulosis. There is
redundant colon present with significant overlap. Cholecystectomy clips are
present. There is no persistent stenosis. The rectum and sigmoid are
otherwise unremarkable. There is no persistent polypoid mass or filling
defect. Postevacuation image shows a moderately large amount of residual air
and barium in the colon. The cecum is not a decompressed.
IMPRESSION: 1. Redundant colon.
2. Extensive sigmoid diverticular disease and also involving the distal
descending colon.

[REDACTED]

## 2014-01-25 DIAGNOSIS — K3189 Other diseases of stomach and duodenum: Secondary | ICD-10-CM | POA: Diagnosis not present

## 2014-01-25 DIAGNOSIS — K625 Hemorrhage of anus and rectum: Secondary | ICD-10-CM | POA: Diagnosis not present

## 2014-01-25 DIAGNOSIS — K5730 Diverticulosis of large intestine without perforation or abscess without bleeding: Secondary | ICD-10-CM | POA: Diagnosis not present

## 2014-01-29 ENCOUNTER — Ambulatory Visit: Payer: Medicare Other | Admitting: General Surgery

## 2014-01-29 ENCOUNTER — Encounter: Payer: Self-pay | Admitting: General Surgery

## 2014-01-29 ENCOUNTER — Ambulatory Visit: Payer: Self-pay | Admitting: General Surgery

## 2014-01-29 DIAGNOSIS — Z1231 Encounter for screening mammogram for malignant neoplasm of breast: Secondary | ICD-10-CM | POA: Diagnosis not present

## 2014-02-08 DIAGNOSIS — IMO0001 Reserved for inherently not codable concepts without codable children: Secondary | ICD-10-CM | POA: Diagnosis not present

## 2014-02-08 DIAGNOSIS — D709 Neutropenia, unspecified: Secondary | ICD-10-CM | POA: Diagnosis not present

## 2014-02-08 DIAGNOSIS — Z23 Encounter for immunization: Secondary | ICD-10-CM | POA: Diagnosis not present

## 2014-02-08 DIAGNOSIS — R6883 Chills (without fever): Secondary | ICD-10-CM | POA: Diagnosis not present

## 2014-02-09 DIAGNOSIS — R6883 Chills (without fever): Secondary | ICD-10-CM | POA: Diagnosis not present

## 2014-02-14 ENCOUNTER — Encounter: Payer: Self-pay | Admitting: General Surgery

## 2014-02-14 ENCOUNTER — Ambulatory Visit (INDEPENDENT_AMBULATORY_CARE_PROVIDER_SITE_OTHER): Payer: Medicare Other | Admitting: General Surgery

## 2014-02-14 VITALS — BP 150/88 | HR 78 | Resp 14 | Ht 62.0 in | Wt 122.0 lb

## 2014-02-14 DIAGNOSIS — Z803 Family history of malignant neoplasm of breast: Secondary | ICD-10-CM

## 2014-02-14 DIAGNOSIS — N6019 Diffuse cystic mastopathy of unspecified breast: Secondary | ICD-10-CM | POA: Diagnosis not present

## 2014-02-14 NOTE — Progress Notes (Signed)
Patient ID: Maria Bush, female   DOB: [birth year], 80 y.o.   MRN: [id number]  Chief Complaint  Patient presents with  . Follow-up    mammogram    HPI Maria Bush is a 80 y.o. female who presents for a breast evaluation. The most recent mammogram was done on 01/29/14. Patient does perform regular self breast checks and gets regular mammograms done.  She has a known history of fibrocystic breast disease.   HPI  Past Medical History  Diagnosis Date  . Hypertension   . Heart murmur 2005  . Arthritis 2005    Past Surgical History  Procedure Laterality Date  . Cardiac catheterization  2011  . Colonoscopy  1982  . Abdominal hysterectomy  1981  . Bladder surgery  1992  . Cholecystectomy  2001  . Rectocele repair  1994    Family History  Problem Relation Age of Onset  . Breast cancer Sister     Social History History  Substance Use Topics  . Smoking status: Never Smoker   . Smokeless tobacco: Never Used  . Alcohol Use: No    Allergies  Allergen Reactions  . Prednisone Nausea And Vomiting  . Sulfa Antibiotics Itching  . Penicillins Rash    Current Outpatient Prescriptions  Medication Sig Dispense Refill  . amLODipine (NORVASC) 5 MG tablet       . aspirin 81 MG tablet Take 81 mg by mouth daily.      Marland Kitchen. atenolol (TENORMIN) 25 MG tablet       . fish oil-omega-3 fatty acids 1000 MG capsule Take 2 g by mouth daily.      Marland Kitchen. lisinopril (PRINIVIL,ZESTRIL) 10 MG tablet Take 10 mg by mouth daily.      . meloxicam (MOBIC) 15 MG tablet       . mirtazapine (REMERON) 30 MG tablet       . Multiple Minerals-Vitamins (CALCIUM & VIT D3 BONE HEALTH PO) Take 1 tablet by mouth daily.      Marland Kitchen. omeprazole (PRILOSEC) 40 MG capsule        No current facility-administered medications for this visit.    Review of Systems Review of Systems  Constitutional: Negative.   Respiratory: Negative.   Cardiovascular: Negative.     Blood pressure 150/88, pulse 78, resp. rate 14, height 5'  2" (1.575 m), weight 122 lb (55.339 kg).  Physical Exam Physical Exam  Constitutional: She is oriented to person, place, and time. She appears well-developed and well-nourished.  Eyes: Conjunctivae are normal.  Neck: Neck supple.  Cardiovascular: Normal rate, regular rhythm and normal heart sounds.   Pulmonary/Chest: Breath sounds normal. Right breast exhibits no inverted nipple, no mass, no nipple discharge, no skin change and no tenderness. Left breast exhibits no inverted nipple, no mass, no nipple discharge, no skin change and no tenderness.  Lymphadenopathy:    She has no cervical adenopathy.    She has no axillary adenopathy.  Neurological: She is alert and oriented to person, place, and time.  Skin: Skin is warm and dry.    Data Reviewed Mammogram reviewed Assessment    Stable exam. History of FCD and family history of breast cancer.    Plan    Patient to return in one year bilateral screening mammogram.       Capucine Tryon G 02/14/2014, 1:06 PM

## 2014-02-14 NOTE — Patient Instructions (Addendum)
Patient to return in one year bilateral screening mammogram .Continue self breast exams. Call office for any new breast issues or concerns.  

## 2014-02-21 DIAGNOSIS — L259 Unspecified contact dermatitis, unspecified cause: Secondary | ICD-10-CM | POA: Diagnosis not present

## 2014-03-05 DIAGNOSIS — H2510 Age-related nuclear cataract, unspecified eye: Secondary | ICD-10-CM | POA: Diagnosis not present

## 2014-03-05 DIAGNOSIS — H269 Unspecified cataract: Secondary | ICD-10-CM | POA: Diagnosis not present

## 2014-03-06 DIAGNOSIS — H2510 Age-related nuclear cataract, unspecified eye: Secondary | ICD-10-CM | POA: Diagnosis not present

## 2014-03-12 DIAGNOSIS — R5381 Other malaise: Secondary | ICD-10-CM | POA: Diagnosis not present

## 2014-03-12 DIAGNOSIS — Z23 Encounter for immunization: Secondary | ICD-10-CM | POA: Diagnosis not present

## 2014-03-12 DIAGNOSIS — D709 Neutropenia, unspecified: Secondary | ICD-10-CM | POA: Diagnosis not present

## 2014-03-12 DIAGNOSIS — R6883 Chills (without fever): Secondary | ICD-10-CM | POA: Diagnosis not present

## 2014-03-12 DIAGNOSIS — R5383 Other fatigue: Secondary | ICD-10-CM | POA: Diagnosis not present

## 2014-03-14 DIAGNOSIS — Z23 Encounter for immunization: Secondary | ICD-10-CM | POA: Diagnosis not present

## 2014-03-14 DIAGNOSIS — R6883 Chills (without fever): Secondary | ICD-10-CM | POA: Diagnosis not present

## 2014-03-14 DIAGNOSIS — R109 Unspecified abdominal pain: Secondary | ICD-10-CM | POA: Diagnosis not present

## 2014-03-14 DIAGNOSIS — D709 Neutropenia, unspecified: Secondary | ICD-10-CM | POA: Diagnosis not present

## 2014-03-15 DIAGNOSIS — I10 Essential (primary) hypertension: Secondary | ICD-10-CM | POA: Diagnosis not present

## 2014-03-26 DIAGNOSIS — H269 Unspecified cataract: Secondary | ICD-10-CM | POA: Diagnosis not present

## 2014-03-26 DIAGNOSIS — H2510 Age-related nuclear cataract, unspecified eye: Secondary | ICD-10-CM | POA: Diagnosis not present

## 2014-07-10 DIAGNOSIS — M1710 Unilateral primary osteoarthritis, unspecified knee: Secondary | ICD-10-CM | POA: Diagnosis not present

## 2014-07-11 DIAGNOSIS — I2510 Atherosclerotic heart disease of native coronary artery without angina pectoris: Secondary | ICD-10-CM | POA: Diagnosis not present

## 2014-07-11 DIAGNOSIS — I498 Other specified cardiac arrhythmias: Secondary | ICD-10-CM | POA: Diagnosis not present

## 2014-07-11 DIAGNOSIS — I491 Atrial premature depolarization: Secondary | ICD-10-CM | POA: Diagnosis not present

## 2014-07-11 DIAGNOSIS — D649 Anemia, unspecified: Secondary | ICD-10-CM | POA: Diagnosis not present

## 2014-07-11 DIAGNOSIS — D709 Neutropenia, unspecified: Secondary | ICD-10-CM | POA: Diagnosis not present

## 2014-07-11 DIAGNOSIS — Z23 Encounter for immunization: Secondary | ICD-10-CM | POA: Diagnosis not present

## 2014-07-11 DIAGNOSIS — E538 Deficiency of other specified B group vitamins: Secondary | ICD-10-CM | POA: Diagnosis not present

## 2014-07-12 DIAGNOSIS — Q393 Congenital stenosis and stricture of esophagus: Secondary | ICD-10-CM | POA: Diagnosis not present

## 2014-07-12 DIAGNOSIS — Q391 Atresia of esophagus with tracheo-esophageal fistula: Secondary | ICD-10-CM | POA: Diagnosis not present

## 2014-07-12 DIAGNOSIS — E785 Hyperlipidemia, unspecified: Secondary | ICD-10-CM | POA: Diagnosis not present

## 2014-07-12 DIAGNOSIS — I471 Supraventricular tachycardia: Secondary | ICD-10-CM | POA: Diagnosis not present

## 2014-07-12 DIAGNOSIS — I10 Essential (primary) hypertension: Secondary | ICD-10-CM | POA: Diagnosis not present

## 2014-07-12 DIAGNOSIS — R0602 Shortness of breath: Secondary | ICD-10-CM | POA: Diagnosis not present

## 2014-07-12 DIAGNOSIS — K222 Esophageal obstruction: Secondary | ICD-10-CM | POA: Insufficient documentation

## 2014-07-17 DIAGNOSIS — R002 Palpitations: Secondary | ICD-10-CM | POA: Diagnosis not present

## 2014-07-20 DIAGNOSIS — R0602 Shortness of breath: Secondary | ICD-10-CM | POA: Diagnosis not present

## 2014-08-06 DIAGNOSIS — M1710 Unilateral primary osteoarthritis, unspecified knee: Secondary | ICD-10-CM | POA: Diagnosis not present

## 2014-08-14 ENCOUNTER — Emergency Department: Payer: Self-pay | Admitting: Emergency Medicine

## 2014-08-14 DIAGNOSIS — K5900 Constipation, unspecified: Secondary | ICD-10-CM | POA: Diagnosis not present

## 2014-08-14 DIAGNOSIS — I10 Essential (primary) hypertension: Secondary | ICD-10-CM | POA: Diagnosis not present

## 2014-08-14 DIAGNOSIS — K5730 Diverticulosis of large intestine without perforation or abscess without bleeding: Secondary | ICD-10-CM | POA: Diagnosis not present

## 2014-08-14 DIAGNOSIS — Z9089 Acquired absence of other organs: Secondary | ICD-10-CM | POA: Diagnosis not present

## 2014-08-14 DIAGNOSIS — Z8719 Personal history of other diseases of the digestive system: Secondary | ICD-10-CM | POA: Diagnosis not present

## 2014-08-14 DIAGNOSIS — Z90710 Acquired absence of both cervix and uterus: Secondary | ICD-10-CM | POA: Diagnosis not present

## 2014-08-14 DIAGNOSIS — R1013 Epigastric pain: Secondary | ICD-10-CM | POA: Diagnosis not present

## 2014-08-14 DIAGNOSIS — I2510 Atherosclerotic heart disease of native coronary artery without angina pectoris: Secondary | ICD-10-CM | POA: Diagnosis not present

## 2014-08-14 DIAGNOSIS — Z23 Encounter for immunization: Secondary | ICD-10-CM | POA: Diagnosis not present

## 2014-08-14 DIAGNOSIS — D709 Neutropenia, unspecified: Secondary | ICD-10-CM | POA: Diagnosis not present

## 2014-08-14 DIAGNOSIS — Z880 Allergy status to penicillin: Secondary | ICD-10-CM | POA: Diagnosis not present

## 2014-08-14 DIAGNOSIS — R109 Unspecified abdominal pain: Secondary | ICD-10-CM | POA: Diagnosis not present

## 2014-08-14 DIAGNOSIS — R1012 Left upper quadrant pain: Secondary | ICD-10-CM | POA: Diagnosis not present

## 2014-08-14 LAB — URINALYSIS, COMPLETE
BILIRUBIN, UR: NEGATIVE
Bacteria: NONE SEEN
Blood: NEGATIVE
Glucose,UR: NEGATIVE mg/dL (ref 0–75)
Ketone: NEGATIVE
Leukocyte Esterase: NEGATIVE
Nitrite: NEGATIVE
Ph: 7 (ref 4.5–8.0)
Protein: NEGATIVE
RBC,UR: 1 /HPF (ref 0–5)
SPECIFIC GRAVITY: 1.006 (ref 1.003–1.030)
WBC UR: 1 /HPF (ref 0–5)

## 2014-08-15 DIAGNOSIS — K5730 Diverticulosis of large intestine without perforation or abscess without bleeding: Secondary | ICD-10-CM | POA: Diagnosis not present

## 2014-08-15 LAB — CBC WITH DIFFERENTIAL/PLATELET
BASOS PCT: 1.2 %
Basophil #: 0.1 10*3/uL (ref 0.0–0.1)
Eosinophil #: 0.2 10*3/uL (ref 0.0–0.7)
Eosinophil %: 3.7 %
HCT: 39.9 % (ref 35.0–47.0)
HGB: 12.9 g/dL (ref 12.0–16.0)
LYMPHS PCT: 27.8 %
Lymphocyte #: 1.3 10*3/uL (ref 1.0–3.6)
MCH: 33.4 pg (ref 26.0–34.0)
MCHC: 32.3 g/dL (ref 32.0–36.0)
MCV: 103 fL — ABNORMAL HIGH (ref 80–100)
MONOS PCT: 6.9 %
Monocyte #: 0.3 x10 3/mm (ref 0.2–0.9)
NEUTROS ABS: 2.7 10*3/uL (ref 1.4–6.5)
Neutrophil %: 60.4 %
Platelet: 248 10*3/uL (ref 150–440)
RBC: 3.87 10*6/uL (ref 3.80–5.20)
RDW: 13.1 % (ref 11.5–14.5)
WBC: 4.5 10*3/uL (ref 3.6–11.0)

## 2014-08-15 LAB — COMPREHENSIVE METABOLIC PANEL
ANION GAP: 6 — AB (ref 7–16)
Albumin: 4 g/dL (ref 3.4–5.0)
Alkaline Phosphatase: 60 U/L
BILIRUBIN TOTAL: 0.9 mg/dL (ref 0.2–1.0)
BUN: 15 mg/dL (ref 7–18)
Calcium, Total: 9 mg/dL (ref 8.5–10.1)
Chloride: 106 mmol/L (ref 98–107)
Co2: 29 mmol/L (ref 21–32)
Creatinine: 0.58 mg/dL — ABNORMAL LOW (ref 0.60–1.30)
Glucose: 83 mg/dL (ref 65–99)
Osmolality: 281 (ref 275–301)
Potassium: 4.3 mmol/L (ref 3.5–5.1)
SGOT(AST): 55 U/L — ABNORMAL HIGH (ref 15–37)
SGPT (ALT): 37 U/L
Sodium: 141 mmol/L (ref 136–145)
TOTAL PROTEIN: 8.1 g/dL (ref 6.4–8.2)

## 2014-08-15 LAB — LIPASE, BLOOD: Lipase: 89 U/L (ref 73–393)

## 2014-08-30 DIAGNOSIS — H4011X3 Primary open-angle glaucoma, severe stage: Secondary | ICD-10-CM | POA: Diagnosis not present

## 2014-09-17 ENCOUNTER — Encounter: Payer: Self-pay | Admitting: General Surgery

## 2014-10-02 DIAGNOSIS — Z23 Encounter for immunization: Secondary | ICD-10-CM | POA: Diagnosis not present

## 2014-11-06 DIAGNOSIS — I10 Essential (primary) hypertension: Secondary | ICD-10-CM | POA: Diagnosis not present

## 2014-11-06 DIAGNOSIS — E782 Mixed hyperlipidemia: Secondary | ICD-10-CM | POA: Diagnosis not present

## 2014-12-27 DIAGNOSIS — R6889 Other general symptoms and signs: Secondary | ICD-10-CM | POA: Diagnosis not present

## 2014-12-27 DIAGNOSIS — D649 Anemia, unspecified: Secondary | ICD-10-CM | POA: Diagnosis not present

## 2015-01-18 DIAGNOSIS — M170 Bilateral primary osteoarthritis of knee: Secondary | ICD-10-CM | POA: Diagnosis not present

## 2015-01-31 DIAGNOSIS — H04123 Dry eye syndrome of bilateral lacrimal glands: Secondary | ICD-10-CM | POA: Diagnosis not present

## 2015-01-31 DIAGNOSIS — H4011X3 Primary open-angle glaucoma, severe stage: Secondary | ICD-10-CM | POA: Diagnosis not present

## 2015-02-06 DIAGNOSIS — Z1231 Encounter for screening mammogram for malignant neoplasm of breast: Secondary | ICD-10-CM | POA: Diagnosis not present

## 2015-02-14 ENCOUNTER — Ambulatory Visit (INDEPENDENT_AMBULATORY_CARE_PROVIDER_SITE_OTHER): Payer: Medicare Other | Admitting: General Surgery

## 2015-02-14 ENCOUNTER — Encounter: Payer: Self-pay | Admitting: General Surgery

## 2015-02-14 VITALS — BP 160/80 | HR 66 | Resp 12 | Ht 62.0 in | Wt 119.0 lb

## 2015-02-14 DIAGNOSIS — Z803 Family history of malignant neoplasm of breast: Secondary | ICD-10-CM | POA: Diagnosis not present

## 2015-02-14 DIAGNOSIS — N6019 Diffuse cystic mastopathy of unspecified breast: Secondary | ICD-10-CM | POA: Diagnosis not present

## 2015-02-14 NOTE — Progress Notes (Signed)
Patient ID: Maria Bush Blatter, female   DOB: Oct 12, 1930, 81 y.o.   MRN: [id number]  Chief Complaint  Patient presents with  . Follow-up    mammogram    HPI Maria Bush Mewborn is a 81 y.o. female who presents for a breast evaluation. The most recent mammogram was done on 02/01/15 .  Patient does perform regular self breast checks and gets regular mammograms done.    HPI  Past Medical History  Diagnosis Date  . Hypertension   . Heart murmur 2005  . Arthritis 2005    Past Surgical History  Procedure Laterality Date  . Cardiac catheterization  2011  . Colonoscopy  1982  . Abdominal hysterectomy  1981  . Bladder surgery  1992  . Cholecystectomy  2001  . Rectocele repair  1994  . Cataract extraction Bilateral 2015    Family History  Problem Relation Age of Onset  . Breast cancer Sister     Social History History  Substance Use Topics  . Smoking status: Never Smoker   . Smokeless tobacco: Never Used  . Alcohol Use: No    Allergies  Allergen Reactions  . Prednisone Nausea And Vomiting  . Sulfa Antibiotics Itching  . Penicillins Rash    Current Outpatient Prescriptions  Medication Sig Dispense Refill  . amLODipine (NORVASC) 5 MG tablet     . aspirin 81 MG tablet Take 81 mg by mouth daily.    Marland Kitchen. atenolol (TENORMIN) 25 MG tablet     . fish oil-omega-3 fatty acids 1000 MG capsule Take 2 g by mouth daily.    Marland Kitchen. lisinopril (PRINIVIL,ZESTRIL) 10 MG tablet Take 10 mg by mouth daily.    . meloxicam (MOBIC) 15 MG tablet     . mirtazapine (REMERON) 30 MG tablet     . Multiple Minerals-Vitamins (CALCIUM & VIT D3 BONE HEALTH PO) Take 1 tablet by mouth daily.    Marland Kitchen. omeprazole (PRILOSEC) 40 MG capsule      No current facility-administered medications for this visit.    Review of Systems Review of Systems  Constitutional: Negative.   Respiratory: Negative.   Cardiovascular: Negative.     Blood pressure 160/80, pulse 66, resp. rate 12, height 5\' 2"  (1.575 m), weight 119 lb  (53.978 kg).  Physical Exam Physical Exam  Constitutional: She is oriented to person, place, and time. She appears well-developed and well-nourished.  Eyes: Conjunctivae are normal. No scleral icterus.  Neck: Neck supple.  Cardiovascular: Normal rate, regular rhythm and normal heart sounds.   Pulmonary/Chest: Effort normal and breath sounds normal. Right breast exhibits no inverted nipple, no mass, no nipple discharge, no skin change and no tenderness. Left breast exhibits no inverted nipple, no mass, no nipple discharge, no skin change and no tenderness.  Abdominal: Soft. Bowel sounds are normal. There is no tenderness.  Lymphadenopathy:    She has no cervical adenopathy.    She has no axillary adenopathy.  Neurological: She is alert and oriented to person, place, and time.  Skin: Skin is warm and dry.    Data Reviewed Mammogram reviewed   Assessment    History of FCD, FH of breast cancer. Stable exam     Plan    Patient will be asked to return to the office in one year with a bilateral screening mammogram.       Thi Klich G 02/14/2015, 6:06 PM

## 2015-02-14 NOTE — Patient Instructions (Signed)
Patient will be asked to return to the office in one year with a bilateral screening mammogram. 

## 2015-02-23 DIAGNOSIS — R05 Cough: Secondary | ICD-10-CM | POA: Diagnosis not present

## 2015-02-23 DIAGNOSIS — J00 Acute nasopharyngitis [common cold]: Secondary | ICD-10-CM | POA: Diagnosis not present

## 2015-02-23 DIAGNOSIS — H9203 Otalgia, bilateral: Secondary | ICD-10-CM | POA: Diagnosis not present

## 2015-03-09 NOTE — Consult Note (Signed)
PATIENT NAME:  Maria Bush, Maria Bush MR#:  [id number] DATE OF BIRTH:  1930-06-30  DATE OF CONSULTATION:  12/05/2013  REFERRING PHYSICIAN:   CONSULTING PHYSICIAN:  Manya Silvas, MD  HISTORY OF PRESENT ILLNESS: The patient is an 81 year old black female who was admitted yesterday for lower GI bleeding. I was asked to see her in consultation.   The patient has had similar problems in the past. She had admission 05/08/2012 for lower GI bleeding, felt to be diverticular in nature, which self resolved. She was noted at that time to have chronically low-grade elevated troponin felt to be demand ischemia. Her last colonoscopy was last year and she had diverticulosis. She had a colonoscopy done at the [facility]. She had another colonoscopy in 2007 here at [facility] that showed sigmoid diverticulosis as well and previous upper endoscopy with esophageal dilatation by Dr. Allen Norris in 2007 for dysphagia.   REVIEW OF SYSTEMS: The patient started bleeding Monday after breakfast, was dark and reddish, very similar to previous episodes. She did not have any abdominal pain with this. No nausea, no vomiting, no fever, and no chest pains. She has not passed blood since she was admitted yesterday afternoon.   The patient says she tries to avoid seeds, nuts, and popcorn. Review of systems is covered in the admission note. She denies any chest pains. No syncope. No hematemesis. No dysuria or hematuria.   PAST MEDICAL HISTORY: Includes hypertension, GERD with stricture, small hiatal hernia, glaucoma.   PAST SURGICAL HISTORY: Cardiac catheterization, gallbladder removal, partial hysterectomy, hernia surgery, and surgery on her left hand.   FAMILY HISTORY: Positive for stroke in father. Coronary artery disease and MI in 2 brothers who are in their 19s.  ADMISSION MEDICATIONS: Vitamin B12 500 mcg a day, Travatan eyedrops 0.04% each eye, omeprazole 40 mg a day, mirtazapine once a day dosage uncertain, meloxicam 50 mg a day,  loratadine 10 mg a day, lisinopril 5 mg a day, fish oil 1 gram a day, ferrous sulfate 325 mg 3 times daily, calcium plus vitamin D once a day, atenolol 25 mg a day, aspirin 81 mg a day, and amlodipine 5 mg a day.   PHYSICAL EXAMINATION: GENERAL: Black female in no acute distress, lying in bed comfortably and resting. VITAL SIGNS: Blood pressure 108/56, pulse 80, temp 97.6.  HEENT: Sclerae anicteric. Conjunctivae negative. Tongue negative.  HEAD: Atraumatic.  NECK: No carotid bruits.  CHEST: Clear.  HEART: Shows no murmurs or gallops I can hear.  ABDOMEN: Flat, soft, nondistended, and nontender. Bowel sounds are present. No hepatosplenomegaly noted.  EXTREMITIES: No edema. She does have compression devices on at this time.   LABORATORY DATA: A-positive blood with a negative antibody screen. White count 5000, hemoglobin 11.4, and platelet count 234. CPK-MB 8.2. Troponin 0.4. Liver panel completely normal. MET-B normal except for creatinine 0.59, sodium 135, and her BUN is 18.   ASSESSMENT: This is very likely lower gastrointestinal bleed from diverticulosis, which she has experienced in the past. The fact that the BUN is not elevated at all and stools are dark indicates that much of the dark stool likely came from her iron pills that she takes. It is noted that although she has chronically low grade elevated troponins these are more elevated than before. Her hemoglobin on admission was 11.4 and hemoglobin this morning is 11 so there has been no significant serious bleeding.   RECOMMENDATIONS: Would go ahead and start her on water today and then clear liquids in the morning.  If she does well with that, full liquids the following morning. If she does well with that, I would discharge her home for follow-up in our office. I see no reason to do a colonoscopy or upper endoscopy at this time. We will follow with you. ____________________________ Manya Silvas, MD rte:sb D: 12/05/2013 14:18:07  ET T: 12/05/2013 15:04:55 ET JOB#: [id number]  cc: Manya Silvas, MD, <Dictator> Vivien Presto, MD Jerrell Belfast, MD Javier Docker. Ubaldo Glassing, MD Lollie Sails, MD Manya Silvas MD ELECTRONICALLY SIGNED 12/06/2013 17:20

## 2015-03-09 NOTE — Consult Note (Signed)
Pt reports no bleeding today.  tol pureed diet at supper.  Recommend discharge tomorrow if no sign of further bleeding.  I will be gone tomorrow so if she has a relapse please call the GI doctor on call for assistance as I will now sign off.  Electronic Signatures: Scot JunElliott, Ambre Kobayashi T (MD)  (Signed on 21-Jan-15 16:54)  Authored  Last Updated: 21-Jan-15 16:54 by Scot JunElliott, Ahmaad Neidhardt T (MD)

## 2015-03-09 NOTE — Discharge Summary (Signed)
PATIENT NAME:  Maria Bush, Maria Bush MR#:  [id number] DATE OF BIRTH:  01/28/1930  DATE OF ADMISSION:  12/04/2013 DATE OF DISCHARGE:  12/07/2013.   PRIMARY CARE PROVIDER:  Lorie PhenixNancy Maloney, MD.   DISCHARGE DIAGNOSES:  1.  Lower gastrointestinal bleed, likely from a diverticular source.  2.  Chronic mild anemia.  3.  Hypertension.  4.  Coronary artery disease with elevated troponin secondary to demand ischemia, not non-ST-elevation myocardial infarction..   CONSULTS:  1.  Dr. Darrold JunkerParaschos with Cardiology.  2.  Dr. Mechele CollinElliott with GI.   ADMITTING HISTORY AND PHYSICAL AND HOSPITAL COURSE:  Please see detailed H and P dictated by Dr. Mordecai MaesSanchez. In brief, an 81 year old female patient with history of prior rectal bleeding with diagnosis of diverticulosis, on aspirin 81 mg daily, presented to the hospital complaining of rectal bleed. The patient had her vitals normal, did not have any hematemesis. The patient was admitted to the hospitalist service. Dr. Mechele CollinElliott with GI was consulted, who suggested the patient likely had diverticular bleed, did not need any further colonoscopies unless she had further bleeding during the hospital stay. The patient's diet was advised by the day of discharge. The patient has tolerated a regular food. No further bleeding noted. hemoglobin in the stable range. No abdominal tenderness on examination with normal bowel sounds. The patient ambulating well and is being discharged back home. I stopped her meloxicam and also held her aspirin until she follows with her doctor when this can be restarted if she does not have any further bleeding.   The patient did have a mild elevation in troponin of 0.38, 0.4. Dr. Darrold JunkerParaschos with Cardiology saw the patient, says this is likely from demand ischemia and did not suggest any further workup. The patient did not have any chest pain, shortness of breath, no EKG changes.   DISCHARGE MEDICATIONS:  Include:  1.  Prilosec 40 mg daily.  2.  Loratadine 10 mg  daily.  3.  Amlodipine 5 mg daily.  4.  Lisinopril 5 mg daily.  5.  Fish oil 1000 mg oral once a day.  6.  Vitamin B12 500 mcg oral once a day.  7.  Calcium with vitamin D 1 tablet daily.  8.  Ferrous sulfate 325 mg oral 3 times a day with meals.  9.  Remeron 15 mg oral once a day.  10.  Atenolol 25 mg oral once a day.   DISCHARGE INSTRUCTIONS:  Low-sodium diet. Activity as tolerated. Follow up with primary care physician and Dr. Mechele CollinElliott in a week. The patient is to hold her aspirin until she sees her doctor and call Dr. Mechele CollinElliott if she notices any further bleeding.   TIME SPENT:  On day of discharge in discharge activity was 40 minutes.    ____________________________ Molinda BailiffSrikar R. Lila Lufkin, MD srs:jm D: 12/07/2013 13:49:32 ET T: 12/07/2013 14:54:45 ET JOB#: [id number]  cc: Wardell HeathSrikar R. Elpidio AnisSudini, MD, <Dictator> Leo GrosserNancy J. Maloney, MD Scot Junobert T. Elliott, MD Orie FishermanSRIKAR R Keeva Reisen MD ELECTRONICALLY SIGNED 12/12/2013 10:17

## 2015-03-09 NOTE — Consult Note (Signed)
See dictated note.  Lower GI bleeding from diverticular source.  Hgb stable, no further bleeding since admission, no abd pain or vomiting.  Elevated troponin higher than usual.  Will start water today and clear liquids in morning and full liquids Thursday and home then if no further bleeding. Since colonoscopy done a year ago I see no need to repeat it at this time.  Electronic Signatures: Scot JunElliott, Elgene Coral T (MD)  (Signed on 20-Jan-15 14:20)  Authored  Last Updated: 20-Jan-15 14:20 by Scot JunElliott, Cornell Bourbon T (MD)

## 2015-03-09 NOTE — H&P (Signed)
PATIENT NAME:  Maria Bush, Maria Bush MR#:  [id number] DATE OF BIRTH:  Sep 09, 1930  DATE OF ADMISSION:  12/04/2013  PRIMARY CARE PHYSICIAN: Leo Grosser, MD   REFERRING PHYSICIAN: Governor Rooks, MD    CHIEF COMPLAINT: Rectal bleeding.   HISTORY OF PRESENT ILLNESS: This is a very nice, 81 year old female with a history of diverticulitis in the past, hypertension, osteoarthritis, hiatal hernia, who was in her normal state of health, but this morning went in to the toilet, had a bowel movement, got up and saw a significant amount of black stool and red blood. The patient has not felt anything different since then. She has not been dizzy, lightheaded or significantly weak. She is feeling her normal self. She was brought to the Emergency Department to get checked. During the examination, rectal exam was performed, showing significant dark stools. The dark stools were heme-positive. The patient had a hemoglobin of 11.4, which is a little bit lower than her normal, which is above 12. The patient is admitted to be monitored closely for GI bleeding.   REVIEW OF SYSTEMS: 12 system review of systems review is done.  CONSTITUTIONAL: No fever, fatigue, weakness, weight loss or weight gain.  EYES: No blurry vision, double vision. Although the patient has glaucoma, it is well controlled.  EARS, NOSE, THROAT: No tinnitus or difficulty swallowing.  RESPIRATORY: No cough, wheezing, hemoptysis or COPD.  CARDIOVASCULAR: No chest pain, orthopnea, or syncope.  GASTROINTESTINAL: No nausea, vomiting, abdominal pain, constipation, diarrhea. Positive melena today.  GENITOURINARY: No dysuria, hematuria, changes in frequency.  ENDOCRINE: No polyuria, polydipsia, polyphagia.  HEMATOLOGIC AND LYMPHATIC: No anemia, easy bruising, or bleeding.  SKIN: No rashes, petechiae, or new lesions.  MUSCULOSKELETAL: Positive for osteoarthritis of the knees, hips, and lower back.  NEUROLOGIC: No numbness, tingling, or previous CVAs.   PSYCHIATRIC: No significant insomnia or depression.   PAST MEDICAL HISTORY: 1.  Hypertension.  2.  Previous GI bleeding secondary to diverticulosis. At that moment, she was diagnosed with diverticular bleeding.  3.  Osteoarthritis.  4.  GERD.  5.  Hiatal hernia.  6.  Glaucoma.  7.  Coronary artery disease, although the patient had a diagnosis of non-ST elevation MI; she did  have a cardiac catheterization, which showed no significant blockages, medical disease, for which she is treated with aspirin.   ALLERGIES: PENICILLIN, SULFA, AND PREDNISONE.   PAST SURGICAL HISTORY: 1.  Cardiac catheterization.  2.  Cholecystectomy.  3.  Partial hysterectomy.  4.  Hiatal hernia.  5.  Left hand surgery.    FAMILY HISTORY: Positive for her mom died at a young age from unknown cause. CVA in her father, and coronary artery disease with MIs in the 28s of 2 brothers.   SOCIAL HISTORY: The patient lives at home with her daughter. She does not drink, does not smoke, does not use any drugs. She is retired.   CURRENT MEDICATIONS: Vitamin B12 500 mcg once daily, Travatan 0.04% in each eye, omeprazole 40 mg once a day, mirtazapine 50 mg once a day, meloxicam 50 mg once a day, loratadine 10 mg once a day, lisinopril 5 mg once a day, fish oil 1000 mg once a day, ferrous sulfate 325 mg 3 times daily, calcium plus vitamin D 600 mg once a day, atenolol 25 mg once a day, aspirin, enteric coated 81 mg daily, amlodipine 5 mg daily.   PHYSICAL EXAMINATION: VITAL SIGNS: Blood pressure 116/72, pulse 70, respirations 18, temperature 98.4, oxygen saturation 99% on room air.  GENERAL: The patient is alert, oriented x3, in no acute distress. No respiratory distress. She looks hemodynamically stable.  HEENT: Her pupils are equal and reactive. Extraocular movements are intact. Mucosa are moist. Anicteric sclerae. Pink conjunctivae. No oral lesions. No oropharyngeal exudates.  NECK: Supple. No JVD. No thyromegaly. No  adenopathy. No carotid bruits. No rigidity.  CARDIOVASCULAR: Regular rate and rhythm. No murmurs, rubs, or gallops are appreciated. No displacement of PMI.  LUNGS: Clear, without any wheezing or crepitus. No use of accessory muscles.  ABDOMEN: Soft, nontender, nondistended. No hepatosplenomegaly. No masses. There is only some tenderness whenever we do deep palpation of the epigastric area. No tenderness to deep palpation of the left or right colonic angles or lower quadrants. No rebound tenderness. No hepatosplenomegaly. Bowel sounds are positive.  GENITAL: Deferred.  EXTREMITIES: No edema, cyanosis or clubbing.  VASCULAR: Pulses +2. Capillary refill less than 3.  LYMPHATIC: Negative for lymphadenopathy in neck or supraclavicular areas.  NEUROLOGIC: Cranial nerves II through XII are intact. Strength is 5/5 in all 4 extremities.  PSYCHIATRIC: No significant insomnia, depression or agitation at this moment visible.  SKIN: No rashes, petechiae.  MUSCULOSKELETAL: No joint deformity or joint swelling.   LABORATORY RESULTS: Creatinine 0.59, sodium 135, potassium 3.8, glucose 77. LFTs were within normal limits. She has history of a chronically elevated troponin of 0.14 last time she was here in December 2013. Right now, last troponin is pending. White count is 5.0, hemoglobin 11, platelet count 234.   EKG: No ST depression or elevation, normal sinus rhythm, poor Q wave progression.   ASSESSMENT AND PLAN: An 81 year old female admitted with rectal bleeding.  1.  Rectal bleeding. This is likely to be secondary to diverticulitis, although the patient had significant finding of melena, which could be just secondary to slow lower gastrointestinal bleeding, but since she has a  hiatal hernia, cannot rule out the possibility of upper gastrointestinal bleeding, for which she is going to be put on a Protonix drip. If there is significant bright red blood per rectum, we are going to do a bleeding scan, but I do not  see a need to do it right now, as her blood levels are normal, with a hemoglobin which is pretty close to her baseline, and no acute changes to her blood pressure. The patient is on nothing by mouth on a Protonix drip. Hold aspirin, hold meloxicam. Since the patient has the potential of low blood pressure with gastrointestinal bleeding, we are going to be checking her vital signs often, and on top of that, hemoglobins every eight hours. The patient is going to have also telemetry monitoring, and her blood pressure medications are going to be held unless her blood pressure get significantly elevated.  2.  As far as hypertension, again, well controlled, but we are holding blood pressure medications.  3.  Medical coronary artery disease. Holding beta blocker due to potential of low blood pressure. Continued aspirin once the patient is cleared from her gastrointestinal bleeding, for which she is going to probably need to hold this for at least two weeks, so aspirin is on hold right now.  4.  Deep vein thrombosis prophylaxis. gastrointestinal prophylaxis with proton pump inhibitor.  5.  Other medical problems are stable. Low sodium, hyponatremia, likely due to intravascular volume depletion. Continue IV fluid replacement. Other medical problems are stable.   Dr. Marva Panda consulted.   Continue n.p.o.   TIME SPENT: I spent about 45 minutes with this admission.  ____________________________ Felipa Furnaceoberto Sanchez Gutierrez, MD rsg:cg D: 12/04/2013 21:57:09 ET T: 12/04/2013 22:17:10 ET JOB#: [id number]  cc: Felipa Furnaceoberto Sanchez Gutierrez, MD, <Dictator> Jashae Wiggs Juanda ChanceSANCHEZ GUTIERRE MD ELECTRONICALLY SIGNED 12/10/2013 8:29

## 2015-03-09 NOTE — Consult Note (Signed)
PATIENT NAME:  Maria Bush, [name] MR#:  [id number] DATE OF BIRTH:  Jan 08, 1930  DATE OF CONSULTATION:  12/05/2013  REFERRING PHYSICIAN:  Marylu Lundoberto Sanchez, MD CONSULTING PHYSICIAN:  Marcina MillardAlexander Malaika Arnall, MD  CHIEF COMPLAINT: Rectal bleeding.   REASON FOR CONSULTATION: Consultation requested for evaluation of borderline elevated troponin.   HISTORY OF PRESENT ILLNESS: The patient is an 81 year old female with history of diverticulitis and hypertension. The patient was in her usual state of health until she was admitted with chief complaint of bright red blood per rectum with black stool. Admission labs were notable for a troponin of 0.4. Hemoglobin and hematocrit were 11.4 and 33.4, respectively. The patient denies chest pain. The patient was seen by Dr. Mechele CollinElliott who recommends outpatient GI work-up including colonoscopy and/or upper endoscopy.   PAST MEDICAL HISTORY:  1.  Hypertension.  2.  History of diverticulitis and prior GI bleed.  3.  The patient apparently was diagnosed with a non-ST-elevation myocardial infarction with previous cardiac catheterization revealing insignificant coronary artery disease.  4.  Osteoarthritis.  5.  Gastroesophageal reflux disease.   MEDICATIONS: Lisinopril 5 mg daily, atenolol 25 mg daily, aspirin 81 mg daily, amlodipine 5 mg daily, vitamin B12 500 mcg daily, Travatan eyedrops, omeprazole 40 mg daily, mirtazapine 50 mg daily, meloxicam 50 mg daily, loratadine 10 mg daily, fish oil caps 1000 mg daily, ferrous sulfate 325 mg t.i.d., calcium plus vitamin D 600 mg daily.   SOCIAL HISTORY: The patient lives at home with her daughter. She denies tobacco or EtOH abuse.   FAMILY HISTORY: The patient has 2 brothers with history of myocardial infarction.   REVIEW OF SYSTEMS:  CONSTITUTIONAL: No fever or chills.  EYES: No blurry vision.  EARS: No hearing loss.  RESPIRATORY: No shortness of breath.  CARDIOVASCULAR: No chest pain.  GASTROINTESTINAL: Bright red blood per  rectum and black stool as mentioned above.  GENITOURINARY: No dysuria or hematuria.  ENDOCRINE: No polyuria or polydipsia.  MUSCULOSKELETAL: No arthralgias or myalgias.  NEUROLOGIC: No focal muscle weakness, numbness.  PSYCHOLOGICAL: No depression or anxiety.   PHYSICAL EXAMINATION:  VITAL SIGNS: Blood pressure 119/71, pulse 76, respirations 18, temperature 97.6, pulse oximetry 99%.  HEENT: Pupils equal and reactive to light and accommodation.  NECK: Supple without thyromegaly.  LUNGS: Clear.  HEART: Normal JVP. Normal PMI. Regular rate and rhythm. Normal S1, S2. Grade 2/6 systolic murmur.  ABDOMEN: Soft and nontender. Pulses were intact bilaterally.  MUSCULOSKELETAL: Normal muscle tone.  NEUROLOGIC: Alert and oriented x3. Motor and sensory both grossly intact.   IMPRESSION: An [age] year old female who presents with bright red blood per rectum. Will likely undergo an outpatient gastrointestinal workup including colonoscopy and endoscopy. The patient has borderline elevated troponin in the absence of chest pain with apparent prior cardiac catheterization revealing insignificant coronary artery disease. I doubt this is due to acute coronary syndrome and more likely due to demand supply ischemia.   RECOMMENDATIONS:  1.  Agree with overall current therapy.  2.  Would defer full-dose anticoagulation, especially in light of GI bleed.  3.  Continue to hold aspirin for now.  4.  No further cardiac diagnostics at this time.  5.  Follow up with Dr. Lady GaryFath as outpatient.   ____________________________ Marcina MillardAlexander Eun Vermeer, MD ap:np D: 12/05/2013 17:51:24 ET T: 12/05/2013 18:37:01 ET JOB#: [id number]  cc: Marcina MillardAlexander Eliab Closson, MD, <Dictator> Marcina MillardALEXANDER Kenniya Westrich MD ELECTRONICALLY SIGNED 01/02/2014 12:29

## 2015-03-10 NOTE — H&P (Signed)
PATIENT NAME:  Maria Bush, Maria Bush MR#:  [id number] DATE OF BIRTH:  28-Jul-1930  DATE OF ADMISSION:  05/08/2012  PRIMARY CARE PHYSICIAN: Dr. Elease Hashimoto  ER PHYSICIAN: Dr. Darnelle Catalan ADMITTING PHYSICIAN: Dr. Tilda Franco  PRESENTING COMPLAINT: Rectal bleeding.  HISTORY OF PRESENT ILLNESS: Patient is an 81 year old lady who was in her usual state of health until yesterday started having maroon-colored stool. Patient states she had three episodes of bowel movement which were all maroon colored mixed with stool. Denies any loss of consciousness. No paroxysmal nocturnal dyspnea, orthopnea, pedal edema, trauma but admits to colicky abdominal pain, intensity 5/10, which has been relieved since arriving to the hospital. No recent change in medication. No dysuria, frequency, incontinence. No fever. Of note, she had episode of diarrhea two days ago and this was preceded by taking some prune juice for constipation. Following current problem she presented to the Emergency Room today where she was discussed with the GI doctor, Dr. Marva Panda, by the ER physician and recommended for admission as she is currently stable. Patient denies any sick contact. No recent change in medication. No prior episodes of rectal bleeding. Had colonoscopy about three years ago for recurrent constipation which was negative for any abnormality.   REVIEW OF SYSTEMS: CONSTITUTIONAL: Denies any fever, fatigue, weakness. No weight loss or weight gain. EYES: No blurred vision, redness or discharge. ENT: No tinnitus, epistaxis, or redness of oropharynx. No difficulty swallowing. RESPIRATORY: Denies any cough or shortness of breath. CARDIOVASCULAR: Denies any chest pain, PND, orthopnea, or pedal edema. No palpitations or syncope. GASTROINTESTINAL: Admits to some nausea. Denies any vomiting but has diarrhea two days ago and abdominal pain today along with maroon-colored stool per rectum. Admits to having episodes of intermittent constipation which is her usual  baseline. GENITOURINARY: No dysuria, frequency, or hematuria. ENDOCRINE: No polyuria, polydipsia, heat or cold intolerance, excessive thirst. HEMATOLOGIC: No anemia, easy bruising, swollen gums. SKIN: No rashes, change in hair or skin texture. MUSCULOSKELETAL: No joint pain, swelling, redness, or limited activity. NEUROLOGICAL No numbness, vertigo, dementia, headache, seizures or memory loss. PSYCH: No anxiety, depression, or nervousness.   PAST MEDICAL HISTORY:  1. Osteoarthritis. 2. Hypertension.  3. History of gastroesophageal reflux disease. 4. Hiatal hernia. 5. Glaucoma. 6. Non-ST elevated myocardial infarction two years ago and status post cardiac catheterization which showed no blockages.   PAST SURGICAL HISTORY:  1. Cardiac catheterization two years ago which showed no blockages.  2. Cholecystectomy. 3. Partial hysterectomy.  4. Hiatal hernia.  5. Left hand surgery.   SOCIAL HISTORY: Lives at home with her family. No alcohol, tobacco, or recreational drug use.   FAMILY HISTORY: Positive for mother who died of unknown cause, father who had CVA and two brothers who had coronary artery disease in their 53s and 60.   ALLERGIES: Penicillin, sulfa and prednisone.    MEDICATIONS:  1. Acetaminophen hydrocodone 500 mg/5, 1 tablet q.8 hours p.r.n.  2. Alprazolam 0.25 mg half tablet alternate daily.  3. Amlodipine 5 mg daily.  4. Aspirin 81 mg daily.  5. Atenolol 25 mg daily.  6. Cetirizine 10 mg daily.  7. Cipro 500 mg twice daily x3 days. 8. Flagyl 500 mg q.8 hours x14 days.  9. Multivitamin 1 tablet daily.  10. Omeprazole 40 mg daily.  11. Remeron 50 mg daily.     PHYSICAL EXAMINATION:  VITAL SIGNS: Temperature 98.8, pulse 79, respiratory rate 20, blood pressure 129/75, sats 99 on room air. Orthostatic negative.   GENERAL: Elderly lady lying on the gurney, awake,  alert, oriented to time, place, and person, in no distress.   HEENT: Atraumatic, normocephalic. Pupils equal,  reactive to light and accommodation. Extraocular movement intact. Mucous membranes pink, moist.   NECK: Supple. No JV distention.   CHEST: Good air entry. Clear to auscultation.   HEART: Regular rate, rhythm. No murmurs.   ABDOMEN: Full, moves with respiration, diffuse tenderness, but no rebound, no guarding. Bowel sound are hypoactive. No organomegaly.   EXTREMITIES: No edema, clubbing, deformity.   NEUROLOGICAL: No focal motor or sensory deficits.   PSYCH: Affect appropriate to situation.   LABORATORY, DIAGNOSTIC, AND RADIOLOGICAL DATA: EKG showed normal sinus rhythm, rate of 69, CBC: White count 7, hemoglobin 11 down from 12.7 from one month ago, platelets 220. Chemistry unremarkable. Creatinine 0.8, potassium 3.9, BUN 18, glucose 107, calcium 8.6. Lipase 117. LFTs are normal. Records show echocardiogram in September 2011 with ejection fraction of more than 55%, right atrial dilatation, moderate tricuspid regurgitation. Records show upper GI endoscopy in August 2011 which shows chronic gastritis. Do not see any report of the colonoscopy which was done in June 2009.   IMPRESSION:  1. GI bleed, query lower GI, likely diverticular cyst.   2. Hypertension, stable.  3. Osteoarthritis, stable.  4. History of coronary artery disease, stable.  5. Gastroesophageal reflux disease, stable.   PLAN:  1. Admit to general medical floor for telemetry. Check serial cardiac enzymes, TSH.  2. GI consult with Dr. Marva PandaSkulskie. Appreciate his recommendation. 3. Follow hemoglobin and hematocrit q.6 and type and hold 2 units PRBC. For bleeding scan if any further bleeding noted. IV access with 2 large bore.   4. PPI for GI protection. Sequential TEDs for deep vein thrombosis protection.  5. CODE STATUS: FULL CODE.  6. Will continue outpatient medications but hold off on aspirin at this time. Will switch her n.p.o. if she continues to have any signs of bleeding.   TOTAL PATIENT CARE TIME: 50 minutes.    ____________________________ Floy SabinaMarcel I. Tilda FrancoAkuneme, MD mia:cms D: 05/08/2012 09:26:08 ET T: 05/08/2012 10:21:43 ET JOB#: [id number]  cc: Humna Moorehouse I. Tilda FrancoAkuneme, MD, <Dictator> Leo GrosserNancy J. Maloney, MD  Jaydah PyleMARCEL I Naevia Unterreiner MD ELECTRONICALLY SIGNED 05/10/2012 0:40

## 2015-03-10 NOTE — Consult Note (Signed)
Chief Complaint:   Subjective/Chief Complaint stable no repeat bleeding. tolerating clears.  no nausea or abdominal pain   VITAL SIGNS/ANCILLARY NOTES: **Vital Signs.:   24-Jun-13 12:37   Vital Signs Type Routine   Temperature Temperature (F) 97.7   Celsius 36.5   Temperature Source Oral   Pulse Pulse 71   Pulse source per vital sign device   Respirations Respirations 18   Systolic BP Systolic BP 086   Diastolic BP (mmHg) Diastolic BP (mmHg) 63   Mean BP 79   BP Source vital sign device   Pulse Ox % Pulse Ox % 97   Pulse Ox Activity Level  At rest   Oxygen Delivery Room Air/ 21 %   Brief Assessment:   Cardiac Regular    Respiratory clear BS    Gastrointestinal details normal Soft  Nontender  Nondistended  No masses palpable  Bowel sounds normal   Lab Results: Routine Chem:  24-Jun-13 04:53    Result Comment hgb - duplicate order. mpg 05/09/12  Result(s) reported on 09 May 2012 at 07:01AM.   Glucose, Serum 84   BUN 12   Creatinine (comp) 0.66   Sodium, Serum 141   Potassium, Serum 3.6   Chloride, Serum  109   CO2, Serum 26   Calcium (Total), Serum  8.2   Anion Gap  6   Osmolality (calc) 280   eGFR (African American) >60   eGFR (Non-African American) >60 (eGFR values <1m/min/1.73 m2 may be an indication of chronic kidney disease (CKD). Calculated eGFR is useful in patients with stable renal function. The eGFR calculation will not be reliable in acutely ill patients when serum creatinine is changing rapidly. It is not useful in  patients on dialysis. The eGFR calculation may not be applicable to patients at the low and high extremes of body sizes, pregnant women, and vegetarians.)  Routine Hem:  23-Jun-13 02:23    Hemoglobin (CBC)  11.0    13:35    Hemoglobin (CBC)  11.0 (Result(s) reported on 08 May 2012 at 01:59PM.)    20:32    Hemoglobin (CBC)  10.2 (Result(s) reported on 08 May 2012 at 08:55PM.)  24-Jun-13 04:53    Hemoglobin (CBC) -   Hemoglobin (CBC)   9.9   WBC (CBC) 3.7   RBC (CBC)  2.92   Hematocrit (CBC)  29.2   Platelet Count (CBC) 194   MCV 100   MCH 33.9   MCHC 33.9   RDW 12.7   Neutrophil % 62.3   Lymphocyte % 23.2   Monocyte % 8.9   Eosinophil % 4.6   Basophil % 1.0   Neutrophil # 2.3   Lymphocyte #  0.9   Monocyte # 0.3   Eosinophil # 0.2   Basophil # 0.0 (Result(s) reported on 09 May 2012 at 07:01AM.)   Assessment/Plan:  Assessment/Plan:   Assessment 1) rectal bleeding-stable not recurrent, likely diverticular.    Plan 1) advance to full  liquids, then to reg/low res as tolerated.  will need o/p GI follup in 2-3 weeks to arrange colonoscopy.   Electronic Signatures: SLoistine Simas(MD)  (Signed 24-Jun-13 16:03)  Authored: Chief Complaint, VITAL SIGNS/ANCILLARY NOTES, Brief Assessment, Lab Results, Assessment/Plan   Last Updated: 24-Jun-13 16:03 by SLoistine Simas(MD)

## 2015-03-10 NOTE — Consult Note (Signed)
PATIENT NAME:  Maria Bush, [name] MR#:  [id number] DATE OF BIRTH:  Jul 10, 1930  DATE OF CONSULTATION:  05/08/2012  REFERRING PHYSICIAN:   CONSULTING PHYSICIAN:  Christena DeemMartin U. Denali Sharma, MD  REASON FOR CONSULTATION: Hematochezia.   HISTORY OF PRESENT ILLNESS: [name] is an 81 year old African American female who presented to the Emergency Room earlier today with some problems of rectal bleeding. She states that about 7:00 p.m. yesterday she began to pass some blood from her rectum. This was dark red. She did associate this with some lower abdominal crampiness. She had no previous episode of this. Currently she states she has no problems with nausea, vomiting, heartburn, or dysphagia. She typically has a bowel movement about every 1 to 2 days. However, she has been taking prune juice up to 3 or 4 times a week inferring that she is having some problems with constipation. She verified this in discussion. She denies any previous episodes of black stools, blood in stools or slimy stools. The last time she had a bowel movement again was last night. She denies any history of peptic ulcer disease. She does take an 81 mg aspirin daily. She has had a colonoscopy, the last one was done 08/16/2006 with finding of some diverticulosis. She has been hemodynamically stable since her admission.   GI FAMILY HISTORY: Negative for colorectal cancer, liver disease, or ulcers.   PAST MEDICAL HISTORY:  1. She had an admission apparently for chest pain, underwent cardiac catheterization about two years ago that was negative.  2. She has had a cholecystectomy. 3. Hysterectomy. 4. Hiatal hernia surgery. 5. Left hand surgery.  6. She has a history of gastroesophageal reflux. 7. Hypertension. 8. Osteoarthritis. 9. Glaucoma.  10. Again, she had a non-ST elevated myocardial infarction previous to her catheterization as noted.   SOCIAL HISTORY: Patient lives with her family. She does not use tobacco, alcohol or illicits.    OUTPATIENT MEDICATIONS:  1. Acetaminophen/hydrocodone 500/5 mg one every eight hours p.r.n.  2. Alprazolam 0.25 mg 1/2 tablet every other day. 3. Amlodipine 5 mg 1 tablet daily.  4. Aspirin 81 mg. 5. Atenolol 25 mg a day. 6. Cetirizine 10 mg once a day.  7. She did have bottles for Cipro and Flagyl, however, these were apparently dated in November 2012.  8. Multiple vitamin.  9. Omeprazole daily, this is 40 mg.  10. Remeron 15 mg once a day at bedtime.   ALLERGIES: She is allergic to penicillin, prednisone and sulfa drugs.   PHYSICAL EXAMINATION:  VITAL SIGNS: Temperature 97.8, pulse 65, respirations 20, blood pressure 164/67, pulse oximetry 97% on room air.   GENERAL: She is an 81 year old African female in no acute distress.   HEENT: Normocephalic, atraumatic. Eyes are anicteric. Nose septum midline. No lesions. Oropharynx no lesions.   NECK: No JVD. No lymphadenopathy. No thyromegaly.   HEART: Regular rate and rhythm.   LUNGS: Clear.   ABDOMEN: Soft, nontender, nondistended. Bowel sounds positive, normoactive.   RECTAL: Anorectal examination shows a dark maroon effluent. This is appearing like older blood/residual. There are no masses noted.   EXTREMITIES: No clubbing, cyanosis, or edema.   NEUROLOGICAL: Cranial nerves II through XII grossly intact. Muscle strength bilaterally equal and symmetric.   LABORATORY, DIAGNOSTIC, AND RADIOLOGICAL DATA: From early this morning she had a glucose 107, BUN 18, creatinine 0.86, sodium 136, potassium 3.9, chloride 103, bicarbonate 27, lipase 117. Hepatic profile normal. She has had two troponin I, one at 0.11, one at 0.12. TSH 1.47. Her  hemogram from early this morning showed a white count 7.1, hemoglobin and hematocrit 11.0/33.1, platelet count 220, MCV 101. She had a repeat hemoglobin at 13:35 hours today showing 11.0, again stable. She has had no imaging studies.   ASSESSMENT: Hematochezia. Patient does have a previous history of  colonoscopy about six years ago showing diverticulosis. This is quite likely a diverticular bleed, however, cannot rule out anal outlet. No abnormality noted on digital rectal examination other than the blood in the stool. This does not appear to be a lower bleed. She has minimal discomfort in the abdomen less likely therefore to be ischemic colitis.   RECOMMENDATIONS:  1. Continue serial hemoglobins. Transfuse as needed.  2. Will start limited clears.  3. Recommend colonoscopy. Would allow her to get over this particular episode and plan for this as an outpatient in a couple of weeks.  4. Case discussed with patient's son and patient.  ____________________________ Christena Deem, MD mus:cms D: 05/08/2012 14:46:21 ET T: 05/08/2012 15:05:26 ET JOB#: [id number]  cc: Christena Deem, MD, <Dictator>  Christena Deem MD ELECTRONICALLY SIGNED 05/11/2012 12:08

## 2015-03-10 NOTE — Consult Note (Signed)
Brief Consult Note: Diagnosis: hematochezia.   Patient was seen by consultant.   Consult note dictated.   Recommend further assessment or treatment.   Comments: Please see full GI consult.  Patietn admitted with rectal bleeding.  DDX diverticular versus anal outlet (less likely).  Much less likely to be ischemic or avms. Continue current.  Last colonoscopy 2007 with finding of diverticulosis.  Will need a colonoscopy once she is over this episode, in about 3 weeks unless there is a clinical change.  Start full liquids tomorrow.  Electronic Signatures: Barnetta ChapelSkulskie, Kamaya Keckler (MD)  (Signed 23-Jun-13 14:49)  Authored: Brief Consult Note   Last Updated: 23-Jun-13 14:49 by Barnetta ChapelSkulskie, Tareek Sabo (MD)

## 2015-03-10 NOTE — Discharge Summary (Signed)
PATIENT NAME:  Maria Bush, Lajuanna L MR#:  [id number] DATE OF BIRTH:  1929-11-30  DATE OF ADMISSION:  05/08/2012 DATE OF DISCHARGE:  05/10/2012  ADMITTING PHYSICIAN: Gaynelle CageMarcel Akuneme, MD  DISCHARGING PHYSICIAN: Enid Baasadhika Lenia Housley, MD  PRIMARY CARE PHYSICIAN: Lorie PhenixNancy Maloney, MD  CONSULTANTS: Barnetta ChapelMartin Skulskie, MD - Gastroenterology.  DISCHARGE DIAGNOSES:  1. Lower gastrointestinal bleed, likely diverticular in nature. It self-resolved. 2. Hypertension.  3. History of diverticulitis recently, off antibiotics now.  4. Arthritis.  5. Elevated troponin, likely valvular insufficiency or demand ischemia.  6. History of coronary artery disease with normal cardiac catheterization approximately a couple of years ago.  7. Gastroesophageal reflux disease.  DISCHARGE HOME MEDICATIONS:  1. Omeprazole 40 mg p.o. daily.  2. Meloxicam 15 mg p.o. daily for arthritis. 3. Loratadine 10 mg p.o. daily.  4. Aspirin 81 mg p.o. daily.  5. Norvasc 5 mg p.o. daily.  6. Lisinopril 5 mg p.o. daily.  7. Travatan ophthalmic drops one drop to both eyes in the evening.  8. Fish oil 1 gram capsule p.o. daily.  9. Calcium supplements 1 tablet p.o. daily.  10. Remeron 15 mg p.o. at bedtime.   NOTE: The patient was advised to hold the following medication - Atenolol.   DISCHARGE DIET: Low sodium diet.   DISCHARGE ACTIVITY: As tolerated.  FOLLOWUP INSTRUCTIONS:  1. Primary care physician follow-up in 1 to 2 weeks.  2. Gastroenterology follow-up in 3 to 4 weeks for colonoscopy. 3. Home health physical therapy.   LABS AND IMAGING STUDIES PRIOR TO DISCHARGE: WBC 3.3, hemoglobin 10.0, hematocrit 30.0, and platelet count 192.   Sodium 141, potassium 3.6, chloride 109, bicarbonate 26, BUN 12, creatinine 0.6, glucose 84, and calcium 8.2. Troponin was steady at 0.12.   Echo Doppler showed normal ejection fraction, no wall motion abnormalities, ejection fraction greater than 55%. There is mild aortic and mild mitral  regurgitation present. No pericardial effusion is seen.   ALT 26, AST 30, alkaline phosphatase 65, total bilirubin 0.3, and albumin 3.6. TSH 1.47. Lipase 117.   BRIEF HOSPITAL COURSE: [name] is an 81 year old elderly female who lives at home with her daughter with past medical history significant for diverticulitis, diverticulosis, and hypertension who presented with maroon-colored stool. Her vitals were stable, but she was admitted for lower GI bleed.  1. Lower GI bleed. She was seen by Dr. Marva PandaSkulskie. No further bleeding while in the hospital, so no bleeding scan was done. Her serial hemoglobins were stable with a little drop secondary to hemodilution. Her last colonoscopy was in 2007 and Dr. Marva PandaSkulskie saw the patient and recommended outpatient colonoscopy in 3 to 4 weeks. The patient did not have any further bleeding while in the hospital so is being discharged home in stable condition.  2. Hypertension. She is on lisinopril and Norvasc at home, which were continued. She is slightly bradycardic so her atenolol was stopped in the hospital.  3. Recent diverticulitis. She was on Cipro and Flagyl as an outpatient, which she finished. We will stop for now because no symptoms.  4. Borderline elevated troponin of 0.11, probably chronic. She has a history of NSTEMI with elevated troponins in the past and cardiac catheterization did not show any acute abnormality. However echo was done and did not show any wall motion abnormalities and the patient was asymptomatic. 5. The patient did work with physical therapy and recommended home health physical therapy.   DISCHARGE CONDITION: Stable.   DISCHARGE DISPOSITION: Home with home health.   TIME SPENT ON DISCHARGE:  45 minutes. ____________________________ Enid Baas, MD rk:slb D: 05/10/2012 12:37:56 ET T: 05/11/2012 10:16:25 ET JOB#: [id number]  cc: Enid Baas, MD, <Dictator> Leo Grosser, MD Christena Deem, MD Enid Baas  MD ELECTRONICALLY SIGNED 05/11/2012 15:24

## 2015-03-13 DIAGNOSIS — M79675 Pain in left toe(s): Secondary | ICD-10-CM | POA: Diagnosis not present

## 2015-03-13 DIAGNOSIS — B351 Tinea unguium: Secondary | ICD-10-CM | POA: Diagnosis not present

## 2015-03-13 DIAGNOSIS — M79674 Pain in right toe(s): Secondary | ICD-10-CM | POA: Diagnosis not present

## 2015-03-22 DIAGNOSIS — M170 Bilateral primary osteoarthritis of knee: Secondary | ICD-10-CM | POA: Diagnosis not present

## 2015-03-22 DIAGNOSIS — M160 Bilateral primary osteoarthritis of hip: Secondary | ICD-10-CM | POA: Diagnosis not present

## 2015-04-02 ENCOUNTER — Encounter: Payer: Self-pay | Admitting: General Surgery

## 2015-04-17 DIAGNOSIS — M6281 Muscle weakness (generalized): Secondary | ICD-10-CM | POA: Diagnosis not present

## 2015-04-17 DIAGNOSIS — R2681 Unsteadiness on feet: Secondary | ICD-10-CM | POA: Diagnosis not present

## 2015-04-24 DIAGNOSIS — R748 Abnormal levels of other serum enzymes: Secondary | ICD-10-CM | POA: Insufficient documentation

## 2015-04-24 DIAGNOSIS — E538 Deficiency of other specified B group vitamins: Secondary | ICD-10-CM | POA: Insufficient documentation

## 2015-04-24 DIAGNOSIS — I10 Essential (primary) hypertension: Secondary | ICD-10-CM | POA: Insufficient documentation

## 2015-04-24 DIAGNOSIS — K5790 Diverticulosis of intestine, part unspecified, without perforation or abscess without bleeding: Secondary | ICD-10-CM | POA: Insufficient documentation

## 2015-04-24 DIAGNOSIS — K5792 Diverticulitis of intestine, part unspecified, without perforation or abscess without bleeding: Secondary | ICD-10-CM | POA: Insufficient documentation

## 2015-04-24 DIAGNOSIS — I491 Atrial premature depolarization: Secondary | ICD-10-CM | POA: Insufficient documentation

## 2015-04-24 DIAGNOSIS — D709 Neutropenia, unspecified: Secondary | ICD-10-CM | POA: Insufficient documentation

## 2015-04-24 DIAGNOSIS — E7800 Pure hypercholesterolemia, unspecified: Secondary | ICD-10-CM | POA: Insufficient documentation

## 2015-04-24 DIAGNOSIS — F329 Major depressive disorder, single episode, unspecified: Secondary | ICD-10-CM | POA: Insufficient documentation

## 2015-04-24 DIAGNOSIS — I00 Rheumatic fever without heart involvement: Secondary | ICD-10-CM | POA: Insufficient documentation

## 2015-04-24 DIAGNOSIS — M129 Arthropathy, unspecified: Secondary | ICD-10-CM | POA: Insufficient documentation

## 2015-04-24 DIAGNOSIS — D649 Anemia, unspecified: Secondary | ICD-10-CM | POA: Insufficient documentation

## 2015-04-24 DIAGNOSIS — I2510 Atherosclerotic heart disease of native coronary artery without angina pectoris: Secondary | ICD-10-CM | POA: Insufficient documentation

## 2015-04-24 DIAGNOSIS — F32A Depression, unspecified: Secondary | ICD-10-CM | POA: Insufficient documentation

## 2015-04-24 DIAGNOSIS — M5136 Other intervertebral disc degeneration, lumbar region: Secondary | ICD-10-CM | POA: Insufficient documentation

## 2015-04-24 DIAGNOSIS — J309 Allergic rhinitis, unspecified: Secondary | ICD-10-CM | POA: Insufficient documentation

## 2015-04-24 DIAGNOSIS — D509 Iron deficiency anemia, unspecified: Secondary | ICD-10-CM | POA: Insufficient documentation

## 2015-04-25 ENCOUNTER — Encounter: Payer: Self-pay | Admitting: Family Medicine

## 2015-04-25 ENCOUNTER — Ambulatory Visit (INDEPENDENT_AMBULATORY_CARE_PROVIDER_SITE_OTHER): Payer: Medicare Other | Admitting: Family Medicine

## 2015-04-25 VITALS — BP 126/64 | HR 64 | Temp 97.9°F | Resp 16 | Wt 126.0 lb

## 2015-04-25 DIAGNOSIS — R2681 Unsteadiness on feet: Secondary | ICD-10-CM | POA: Diagnosis not present

## 2015-04-25 DIAGNOSIS — R609 Edema, unspecified: Secondary | ICD-10-CM | POA: Diagnosis not present

## 2015-04-25 DIAGNOSIS — R0602 Shortness of breath: Secondary | ICD-10-CM | POA: Diagnosis not present

## 2015-04-25 DIAGNOSIS — M1990 Unspecified osteoarthritis, unspecified site: Secondary | ICD-10-CM

## 2015-04-25 NOTE — Progress Notes (Signed)
Subjective:    Patient ID: Maria Bush, female    DOB: [birth year], 81 y.o.   MRN: [id number]  HPI   Pt comes in today complaining of bilateral leg and feet swelling.  She reports this has been going on for several weeks, seems to be about the same.  The swelling is worse at night and does improve slightly in the mornings.  She has not been keeping her feet propped up, and she has not tried support hose.  She has recently been seen at [facility] orthopedics and they are recommending a possible left hip and knee replacement.  Pt's daughter is concerned that the swelling could be caused by the orthopedic issues or her blood pressure.  Pt does check her blood pressure at home and she reports it has been "running fine".    Daughter needs her FMLA paperwork.  Has missed more work because of mom's declining health.     Patient Active Problem List   Diagnosis Date Noted  . Allergic rhinitis 04/24/2015  . Absolute anemia 04/24/2015  . Arthropathia 04/24/2015  . B12 deficiency 04/24/2015  . Atherosclerosis of coronary artery 04/24/2015  . DDD (degenerative disc disease), lumbar 04/24/2015  . Clinical depression 04/24/2015  . Diverticulitis 04/24/2015  . DD (diverticular disease) 04/24/2015  . Elevated CK 04/24/2015  . Atrial premature depolarization 04/24/2015  . Hypercholesteremia 04/24/2015  . BP (high blood pressure) 04/24/2015  . Anemia, iron deficiency 04/24/2015  . Neutropenia 04/24/2015  . 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 Lower esophageal ring 07/12/2014  . Cystocele, midline 09/01/2013  . 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 Family history of malignant neoplasm of breast 02/10/2013  . Diffuse cystic mastopathy 02/10/2013  . Arthritis    Family History  Problem Relation Age of Onset  . Breast cancer Sister   . Early death Mother   . Stroke Father   . Heart disease Brother   . Heart attack Brother   . Heart disease Brother   .  Heart attack Brother    History   Social History  . Marital Status: Widowed    Spouse Name: N/A  . Number of Children: 2  . Years of Education: HS   Occupational History  . Retired     Education officer, environmental   Social History Main Topics  . Smoking status: Never Smoker   . Smokeless tobacco: Never Used  . Alcohol Use: No  . Drug Use: No  . Sexual Activity: Not on file   Other Topics Concern  . Not on file   Social History Narrative   Past Surgical History  Procedure Laterality Date  . Cardiac catheterization  2011  . Colonoscopy  1982  . Bladder surgery  1992  . Rectocele repair  1994  . Cataract extraction Bilateral 2015  . Cholecystectomy  08/09/2000  . Breast biopsy  1992  . Abdominal hysterectomy  1981  . Dilation and curettage of uterus  1979   Current Outpatient Prescriptions on File Prior to Visit  Medication Sig Dispense Refill  . amLODipine (NORVASC) 5 MG tablet     . aspirin 81 MG tablet Take 81 mg by mouth daily.    Marland Kitchen atenolol (TENORMIN) 25 MG tablet     . fish oil-omega-3 fatty acids 1000 MG capsule Take 2 g by mouth daily.    . fluticasone (FLONASE) 50 MCG/ACT nasal spray Place into the nose.    Marland Kitchen lisinopril (PRINIVIL,ZESTRIL) 10 MG tablet Take 10  mg by mouth daily.    Marland Kitchen loratadine (CLARITIN) 10 MG tablet Take by mouth.    . meloxicam (MOBIC) 15 MG tablet     . mirtazapine (REMERON) 30 MG tablet     . Multiple Minerals-Vitamins (CALCIUM & VIT D3 BONE HEALTH PO) Take 1 tablet by mouth daily.    Marland Kitchen omeprazole (PRILOSEC) 40 MG capsule     . Travoprost, BAK Free, (TRAVATAN Z) 0.004 % SOLN ophthalmic solution Apply to eye.     No current facility-administered medications on file prior to visit.   Allergies  Allergen Reactions  . Cephalexin Shortness Of Breath  . Prednisone Nausea And Vomiting  . Sulfa Antibiotics Itching  . Penicillins Rash   BP 126/64 mmHg  Pulse 64  Temp(Src) 97.9 F (36.6 C) (Oral)  Resp 16  Wt 126 lb (57.153 kg)   Review of Systems   Constitutional: Positive for chills. Negative for fever, diaphoresis, activity change, appetite change, fatigue and unexpected weight change.  Respiratory: Positive for shortness of breath (Occasional). Negative for apnea, cough, choking, chest tightness, wheezing and stridor.   Cardiovascular: Positive for leg swelling. Negative for chest pain and palpitations.  Gastrointestinal: Negative for nausea, vomiting, abdominal pain, diarrhea, constipation, blood in stool, abdominal distention, anal bleeding and rectal pain.  Musculoskeletal: Positive for arthralgias (May need knee and hip surgery, followed by [facility] ). Negative for myalgias, back pain, joint swelling, gait problem, neck pain and neck stiffness.  Neurological: Negative for tremors, seizures, syncope, facial asymmetry, speech difficulty, weakness, light-headedness, numbness and headaches.       Objective:   Physical Exam  Constitutional: She is oriented to person, place, and time. She appears well-developed and well-nourished.  Cardiovascular: Normal rate and regular rhythm.   Pulmonary/Chest: Effort normal and breath sounds normal.  Neurological: She is alert and oriented to person, place, and time.  Psychiatric: She has a normal mood and affect. Her behavior is normal. Judgment and thought content normal.   Blood pressure 126/64, pulse 64, temperature 97.9 F (36.6 C), temperature source Oral, resp. rate 16, weight 126 lb (57.153 kg).         Assessment & Plan:    1. Edema Worsening. Will check labs. Will not treat unless causes pain secondary to risk of falls.   - COMPLETE METABOLIC PANEL WITH GFR  2. Arthritis Will check labs. Do not think surgery will help her swelling.  Pain is not severe. Will wait.  - CBC with Differential/Platelet  3. Shortness of breath Will check labs.  - B Nat Peptide - CBC with Differential/Platelet  4. Unsteady gait Will  refer back to [facility]   - Ambulatory  referral to Physical Therapy

## 2015-04-26 ENCOUNTER — Telehealth: Payer: Self-pay

## 2015-04-26 LAB — CMP14+EGFR
ALT: 31 IU/L (ref 0–32)
AST: 38 IU/L (ref 0–40)
Albumin/Globulin Ratio: 1.6 (ref 1.1–2.5)
Albumin: 4.2 g/dL (ref 3.5–4.7)
Alkaline Phosphatase: 50 IU/L (ref 39–117)
BILIRUBIN TOTAL: 0.7 mg/dL (ref 0.0–1.2)
BUN/Creatinine Ratio: 23 (ref 11–26)
BUN: 16 mg/dL (ref 8–27)
CHLORIDE: 100 mmol/L (ref 97–108)
CO2: 23 mmol/L (ref 18–29)
Calcium: 9.3 mg/dL (ref 8.7–10.3)
Creatinine, Ser: 0.71 mg/dL (ref 0.57–1.00)
GFR calc non Af Amer: 79 mL/min/{1.73_m2} (ref >59)
GFR, EST AFRICAN AMERICAN: 91 mL/min/{1.73_m2} (ref >59)
GLUCOSE: 75 mg/dL (ref 65–99)
Globulin, Total: 2.7 g/dL (ref 1.5–4.5)
POTASSIUM: 4.7 mmol/L (ref 3.5–5.2)
SODIUM: 139 mmol/L (ref 134–144)
Total Protein: 6.9 g/dL (ref 6.0–8.5)

## 2015-04-26 LAB — CBC WITH DIFFERENTIAL/PLATELET
BASOS ABS: 0 10*3/uL (ref 0.0–0.2)
BASOS: 0 %
EOS (ABSOLUTE): 0.1 10*3/uL (ref 0.0–0.4)
Eos: 2 %
HEMATOCRIT: 35 % (ref 34.0–46.6)
HEMOGLOBIN: 11.8 g/dL (ref 11.1–15.9)
Immature Grans (Abs): 0 10*3/uL (ref 0.0–0.1)
Immature Granulocytes: 0 %
Lymphocytes Absolute: 1.5 10*3/uL (ref 0.7–3.1)
Lymphs: 31 %
MCH: 33.1 pg — AB (ref 26.6–33.0)
MCHC: 33.7 g/dL (ref 31.5–35.7)
MCV: 98 fL — ABNORMAL HIGH (ref 79–97)
MONOCYTES: 9 %
MONOS ABS: 0.4 10*3/uL (ref 0.1–0.9)
NEUTROS ABS: 2.7 10*3/uL (ref 1.4–7.0)
Neutrophils: 58 %
Platelets: 276 10*3/uL (ref 150–379)
RBC: 3.57 x10E6/uL — ABNORMAL LOW (ref 3.77–5.28)
RDW: 13.1 % (ref 12.3–15.4)
WBC: 4.7 10*3/uL (ref 3.4–10.8)

## 2015-04-26 LAB — BRAIN NATRIURETIC PEPTIDE: BNP: 77.1 pg/mL (ref 0.0–100.0)

## 2015-04-26 NOTE — Telephone Encounter (Signed)
Informed pt of lab results. Emily Drozdowski, CMA  

## 2015-04-26 NOTE — Telephone Encounter (Signed)
-----   Message from [name], MD sent at 04/26/2015  2:04 PM EDT ----- Labs normal. Kidney function good. No signs of heart failure. Maria Bush

## 2015-04-29 DIAGNOSIS — R2681 Unsteadiness on feet: Secondary | ICD-10-CM | POA: Diagnosis not present

## 2015-04-29 DIAGNOSIS — M6281 Muscle weakness (generalized): Secondary | ICD-10-CM | POA: Diagnosis not present

## 2015-05-03 DIAGNOSIS — R2681 Unsteadiness on feet: Secondary | ICD-10-CM | POA: Diagnosis not present

## 2015-05-03 DIAGNOSIS — M6281 Muscle weakness (generalized): Secondary | ICD-10-CM | POA: Diagnosis not present

## 2015-05-08 DIAGNOSIS — R2681 Unsteadiness on feet: Secondary | ICD-10-CM | POA: Diagnosis not present

## 2015-05-08 DIAGNOSIS — M6281 Muscle weakness (generalized): Secondary | ICD-10-CM | POA: Diagnosis not present

## 2015-05-14 ENCOUNTER — Other Ambulatory Visit: Payer: Self-pay | Admitting: Family Medicine

## 2015-05-14 DIAGNOSIS — M5136 Other intervertebral disc degeneration, lumbar region: Secondary | ICD-10-CM

## 2015-05-15 DIAGNOSIS — M6281 Muscle weakness (generalized): Secondary | ICD-10-CM | POA: Diagnosis not present

## 2015-05-15 DIAGNOSIS — R2681 Unsteadiness on feet: Secondary | ICD-10-CM | POA: Diagnosis not present

## 2015-05-17 DIAGNOSIS — R2681 Unsteadiness on feet: Secondary | ICD-10-CM | POA: Diagnosis not present

## 2015-05-17 DIAGNOSIS — M6281 Muscle weakness (generalized): Secondary | ICD-10-CM | POA: Diagnosis not present

## 2015-05-22 DIAGNOSIS — M6281 Muscle weakness (generalized): Secondary | ICD-10-CM | POA: Diagnosis not present

## 2015-05-22 DIAGNOSIS — R2681 Unsteadiness on feet: Secondary | ICD-10-CM | POA: Diagnosis not present

## 2015-05-23 DIAGNOSIS — R2681 Unsteadiness on feet: Secondary | ICD-10-CM | POA: Diagnosis not present

## 2015-05-23 DIAGNOSIS — M6281 Muscle weakness (generalized): Secondary | ICD-10-CM | POA: Diagnosis not present

## 2015-05-27 ENCOUNTER — Ambulatory Visit (INDEPENDENT_AMBULATORY_CARE_PROVIDER_SITE_OTHER): Payer: Medicare Other | Admitting: Family Medicine

## 2015-05-27 ENCOUNTER — Encounter: Payer: Self-pay | Admitting: Family Medicine

## 2015-05-27 VITALS — BP 144/80 | HR 73 | Temp 98.0°F | Resp 16 | Wt 129.2 lb

## 2015-05-27 DIAGNOSIS — H9203 Otalgia, bilateral: Secondary | ICD-10-CM | POA: Diagnosis not present

## 2015-05-27 NOTE — Progress Notes (Signed)
Subjective:     Patient ID: Alexandria LodgeMargaret L Allocca, female   DOB: 28-Sep-1930, 81 y.o.   MRN: [id number]  HPI Chief Complaint  Patient presents with  . Ear Pain    patient comes in office today with complain of bilateral ear pain intermittent x2 days or more. Patient denies ear discharge or decreased hearing  States she has not been swimming or dunking her head. Does report that she has been getting physical therapy and neck stiffness has accompanied her ear discomfort. Remains on meloxicam daily. Recent labs reviewed.  Review of Systems  HENT:       No cold or allergy sx.       Objective:   Physical Exam  Constitutional: She appears well-developed and well-nourished. No distress.  HENT:  Right Ear: Tympanic membrane and ear canal normal.  Left Ear: Tympanic membrane and ear canal normal.  Edentulous. No tragal tenderness  Musculoskeletal:  Cervical ROM limited in all planes c/w her age. No  TMJ tenderness       Assessment:    1. 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 Add acetaminophen 500 mg. Two pills 3 x day.

## 2015-05-27 NOTE — Patient Instructions (Signed)
Continue with Meloxicam. Add Tylenol 500 mg.-two pills up to 3 x day.

## 2015-05-29 DIAGNOSIS — R2681 Unsteadiness on feet: Secondary | ICD-10-CM | POA: Diagnosis not present

## 2015-05-29 DIAGNOSIS — M6281 Muscle weakness (generalized): Secondary | ICD-10-CM | POA: Diagnosis not present

## 2015-06-01 DIAGNOSIS — M1711 Unilateral primary osteoarthritis, right knee: Secondary | ICD-10-CM | POA: Diagnosis not present

## 2015-06-01 DIAGNOSIS — R600 Localized edema: Secondary | ICD-10-CM | POA: Diagnosis not present

## 2015-06-03 DIAGNOSIS — M6281 Muscle weakness (generalized): Secondary | ICD-10-CM | POA: Diagnosis not present

## 2015-06-03 DIAGNOSIS — R2681 Unsteadiness on feet: Secondary | ICD-10-CM | POA: Diagnosis not present

## 2015-06-04 ENCOUNTER — Ambulatory Visit: Payer: Medicare Other | Admitting: Family Medicine

## 2015-06-06 DIAGNOSIS — R2681 Unsteadiness on feet: Secondary | ICD-10-CM | POA: Diagnosis not present

## 2015-06-06 DIAGNOSIS — M6281 Muscle weakness (generalized): Secondary | ICD-10-CM | POA: Diagnosis not present

## 2015-06-10 ENCOUNTER — Telehealth: Payer: Self-pay | Admitting: Family Medicine

## 2015-06-10 NOTE — Telephone Encounter (Signed)
Please triage. Wednesday should be fine. Thanks.

## 2015-06-10 NOTE — Telephone Encounter (Signed)
Pt called to get an appointment for a f/u from going to urgent care about a week ago for dizzy spells. I scheduled pt for Wednesday 06/12/15 @ 11:30. Do you want to see pt before Wednesday? Can I work pt in a same day slot for tomorrow? Thanks TNP

## 2015-06-11 ENCOUNTER — Encounter: Payer: Self-pay | Admitting: Family Medicine

## 2015-06-11 ENCOUNTER — Ambulatory Visit (INDEPENDENT_AMBULATORY_CARE_PROVIDER_SITE_OTHER): Payer: Medicare Other | Admitting: Family Medicine

## 2015-06-11 VITALS — BP 140/80 | HR 66 | Temp 98.5°F | Resp 16 | Wt 123.0 lb

## 2015-06-11 DIAGNOSIS — M1990 Unspecified osteoarthritis, unspecified site: Secondary | ICD-10-CM

## 2015-06-11 DIAGNOSIS — R609 Edema, unspecified: Secondary | ICD-10-CM

## 2015-06-11 DIAGNOSIS — I10 Essential (primary) hypertension: Secondary | ICD-10-CM

## 2015-06-11 MED ORDER — HYDROCHLOROTHIAZIDE 12.5 MG PO CAPS: 12.5000 mg | ORAL_CAPSULE | Freq: Every day | ORAL | Status: DC

## 2015-06-11 MED ORDER — AMLODIPINE BESYLATE 2.5 MG PO TABS: 2.5000 mg | ORAL_TABLET | Freq: Every day | ORAL | Status: DC

## 2015-06-11 NOTE — Progress Notes (Signed)
Patient ID: Maria Bush, female   DOB: 02-Sep-1930, 81 y.o.   MRN: [id number]       Patient: Maria Bush Female    DOB: 07-Dec-1929   81 y.o.   MRN: [id number] Visit Date: 06/11/2015  Today's Provider: Margarita Rana, MD   Chief Complaint  Patient presents with  . Leg Swelling    bilateral legs, follow-up from [facility] on 06/01/2015, they prescribed her tramadol prn for pain.vitals were O2 99%, pulse 74, B/P 140/70, temp 98.3   . Leg Pain    and knee pain. was seen on walking clinic on 06/01/2015   Subjective:    HPI     Allergies  Allergen Reactions  . Cephalexin Shortness Of Breath  . Prednisone Nausea And Vomiting  . Sulfa Antibiotics Itching  . Penicillins Rash   Previous Medications   AMLODIPINE (NORVASC) 5 MG TABLET       ASPIRIN 81 MG TABLET    Take 81 mg by mouth daily.   ATENOLOL (TENORMIN) 25 MG TABLET       CALCIUM CARB-CHOLECALCIFEROL (CALCIUM 600 + D PO)    Take by mouth.   FISH OIL-OMEGA-3 FATTY ACIDS 1000 MG CAPSULE    Take 2 g by mouth daily.   FLUTICASONE (FLONASE) 50 MCG/ACT NASAL SPRAY    Place into the nose.   LISINOPRIL (PRINIVIL,ZESTRIL) 10 MG TABLET    Take 10 mg by mouth daily.   LORATADINE (CLARITIN) 10 MG TABLET    Take by mouth.   MELOXICAM (MOBIC) 15 MG TABLET    TAKE ONE (1) TABLET EACH DAY   MIRTAZAPINE (REMERON) 30 MG TABLET       MULTIPLE MINERALS-VITAMINS (CALCIUM & VIT D3 BONE HEALTH PO)    Take 1 tablet by mouth daily.   OMEPRAZOLE (PRILOSEC) 40 MG CAPSULE       TRAMADOL (ULTRAM) 50 MG TABLET    Take 1 tablet by mouth every 6 (six) hours as needed.   TRAVOPROST, BAK FREE, (TRAVATAN Z) 0.004 % SOLN OPHTHALMIC SOLUTION    Apply to eye.    Review of Systems  History  Substance Use Topics  . Smoking status: Never Smoker   . Smokeless tobacco: Never Used  . Alcohol Use: No   Objective:   BP 140/80 mmHg  Pulse 66  Temp(Src) 98.5 F (36.9 C) (Oral)  Resp 16  Wt 123 lb (55.792 kg)  Physical Exam  Constitutional:  She is oriented to person, place, and time. She appears well-developed and well-nourished.  Cardiovascular: Normal rate and regular rhythm.   Pulmonary/Chest: Breath sounds normal.  Musculoskeletal: She exhibits edema (shiny, plus one, bilateral lower extremity).  Neurological: She is alert and oriented to person, place, and time.      Assessment & Plan:     1. Arthritis Continue Ultram as needed.   2. Edema Worsening. May be related to medication, see plan below.   3. Essential hypertension Stable, but may be causing worsening edema. Will decrease Amlodipine to 2.5 mg daily and add HCTZ 12.5 mg daily. Recheck ov in 2 weeks. Will check met c at follow up also.        Maria Rana, MD  [facility]

## 2015-06-12 ENCOUNTER — Ambulatory Visit: Payer: Medicare Other | Admitting: Family Medicine

## 2015-06-13 DIAGNOSIS — R2681 Unsteadiness on feet: Secondary | ICD-10-CM | POA: Diagnosis not present

## 2015-06-13 DIAGNOSIS — M6281 Muscle weakness (generalized): Secondary | ICD-10-CM | POA: Diagnosis not present

## 2015-06-15 ENCOUNTER — Other Ambulatory Visit: Payer: Self-pay | Admitting: Family Medicine

## 2015-06-15 DIAGNOSIS — I10 Essential (primary) hypertension: Secondary | ICD-10-CM

## 2015-06-17 ENCOUNTER — Telehealth: Payer: Self-pay | Admitting: Family Medicine

## 2015-06-17 DIAGNOSIS — M6281 Muscle weakness (generalized): Secondary | ICD-10-CM | POA: Diagnosis not present

## 2015-06-17 DIAGNOSIS — R2681 Unsteadiness on feet: Secondary | ICD-10-CM | POA: Diagnosis not present

## 2015-06-17 NOTE — Telephone Encounter (Signed)
Will hold off on fluid pill for now. Decrease Amlodipine to 2.5 daily and monitor.   Thanks.

## 2015-06-17 NOTE — Telephone Encounter (Signed)
Pt states she picked up the Rx hydrochlorothiazide (MICROZIDE) 12.5 MG and the pharmacy told pt this medication has sulfur in it.  Pt states she is allergic to sulfur.  Pt is asking if she can get a different Rx.  Maria Bush.  CB#[phone]/MW

## 2015-06-18 DIAGNOSIS — R2681 Unsteadiness on feet: Secondary | ICD-10-CM | POA: Diagnosis not present

## 2015-06-18 DIAGNOSIS — M6281 Muscle weakness (generalized): Secondary | ICD-10-CM | POA: Diagnosis not present

## 2015-06-25 ENCOUNTER — Ambulatory Visit (INDEPENDENT_AMBULATORY_CARE_PROVIDER_SITE_OTHER): Payer: Medicare Other | Admitting: Family Medicine

## 2015-06-25 ENCOUNTER — Encounter: Payer: Self-pay | Admitting: Family Medicine

## 2015-06-25 VITALS — BP 134/74 | HR 64 | Temp 97.9°F | Resp 16 | Wt 128.0 lb

## 2015-06-25 DIAGNOSIS — M542 Cervicalgia: Secondary | ICD-10-CM | POA: Diagnosis not present

## 2015-06-25 DIAGNOSIS — R609 Edema, unspecified: Secondary | ICD-10-CM

## 2015-06-25 DIAGNOSIS — I10 Essential (primary) hypertension: Secondary | ICD-10-CM | POA: Diagnosis not present

## 2015-06-25 MED ORDER — POTASSIUM CHLORIDE CRYS ER 10 MEQ PO TBCR: 10.0000 meq | EXTENDED_RELEASE_TABLET | Freq: Every day | ORAL | Status: DC

## 2015-06-25 MED ORDER — LISINOPRIL 20 MG PO TABS: 20.0000 mg | ORAL_TABLET | Freq: Every day | ORAL | Status: DC

## 2015-06-25 MED ORDER — FUROSEMIDE 20 MG PO TABS: 20.0000 mg | ORAL_TABLET | Freq: Every day | ORAL | Status: DC

## 2015-06-25 NOTE — Progress Notes (Signed)
Patient ID: Maria Bush, female   DOB: [birth year], 81 y.o.   MRN: [id number]        Patient: Maria Bush Female    DOB: 11/03/1930   81 y.o.   MRN: [id number] Visit Date: 06/25/2015  Today's Provider: [name], MD   Chief Complaint  Patient presents with  . Hypertension  . Leg Swelling  . Neck Pain   Subjective:    Hypertension This is a chronic problem. The problem is unchanged. The problem is controlled (Recently decreased Amlodipine to 2.5mg  secondary to swelling.  She was unble to start HCTZ secondary to a medication allergy. ). Associated symptoms include neck pain and peripheral edema. Pertinent negatives include no chest pain, headaches, palpitations or shortness of breath. There are no compliance problems.   Neck Pain  The current episode started in the past 7 days. The problem occurs daily. The problem has been unchanged. The pain is associated with nothing. The pain is present in the occipital region. The quality of the pain is described as aching. The pain is at a severity of 8/10. The symptoms are aggravated by bending. The pain is same all the time. Stiffness is present all day. Pertinent negatives include no chest pain, headaches, numbness, pain with swallowing, tingling, trouble swallowing or weakness. She has tried acetaminophen for the symptoms. The treatment provided mild relief.   Edema: Patient complains of edema. The location of the edema is lower leg(s) bilateral.  The edema has been moderate.  Onset of symptoms was 3 weeks ago, unchanged since that time. The edema is present in the evening. The patient states never.  The swelling has been aggravated by use of medication such as Amlodipine, relieved by nothing, and been associated with nothing. Cardiac risk factors include advanced age (older than 26 for men, 36 for women) and hypertension.      Allergies  Allergen Reactions  . Cephalexin Shortness Of Breath  . Prednisone Nausea And Vomiting  .  Sulfa Antibiotics Itching  . Penicillins Rash   Previous Medications   AMLODIPINE (NORVASC) 2.5 MG TABLET    Take 1 tablet (2.5 mg total) by mouth daily. Stop 5 mg dose.   ASPIRIN 81 MG TABLET    Take 81 mg by mouth daily.   ATENOLOL (TENORMIN) 25 MG TABLET    TAKE ONE (1) TABLET EACH DAY   CALCIUM CARB-CHOLECALCIFEROL (CALCIUM 600 + D PO)    Take by mouth.   FISH OIL-OMEGA-3 FATTY ACIDS 1000 MG CAPSULE    Take 2 g by mouth daily.   FLUTICASONE (FLONASE) 50 MCG/ACT NASAL SPRAY    Place into the nose.   LISINOPRIL (PRINIVIL,ZESTRIL) 10 MG TABLET    Take 10 mg by mouth daily.   LORATADINE (CLARITIN) 10 MG TABLET    Take by mouth.   MELOXICAM (MOBIC) 15 MG TABLET    TAKE ONE (1) TABLET EACH DAY   MIRTAZAPINE (REMERON) 30 MG TABLET       MULTIPLE MINERALS-VITAMINS (CALCIUM & VIT D3 BONE HEALTH PO)    Take 1 tablet by mouth daily.   OMEPRAZOLE (PRILOSEC) 40 MG CAPSULE       TRAVOPROST, BAK FREE, (TRAVATAN Z) 0.004 % SOLN OPHTHALMIC SOLUTION    Apply to eye.    Review of Systems  Constitutional: Positive for chills (Occasionally.).  HENT: Negative for sore throat and trouble swallowing.   Respiratory: Negative for apnea, cough, choking, chest tightness, shortness of breath, wheezing and stridor.   Cardiovascular: Positive  for leg swelling. Negative for chest pain and palpitations.  Gastrointestinal: Negative for nausea, vomiting, abdominal pain, diarrhea, constipation, blood in stool, abdominal distention, anal bleeding and rectal pain.  Musculoskeletal: Positive for neck pain and neck stiffness. Negative for myalgias, back pain, joint swelling, arthralgias and gait problem.  Neurological: Negative for dizziness, tingling, tremors, weakness, light-headedness, numbness and headaches.    History  Substance Use Topics  . Smoking status: Never Smoker   . Smokeless tobacco: Never Used  . Alcohol Use: No   Objective:   BP 134/74 mmHg  Pulse 64  Temp(Src) 97.9 F (36.6 C) (Oral)  Resp 16   Wt 128 lb (58.06 kg)  Physical Exam  Constitutional: She is oriented to person, place, and time. She appears well-developed and well-nourished.  Cardiovascular: Normal rate and regular rhythm.   Pulmonary/Chest: Effort normal and breath sounds normal.  Neurological: She is alert and oriented to person, place, and time.  Psychiatric: She has a normal mood and affect. Her behavior is normal. Judgment and thought content normal.      Assessment & Plan:     1. Essential hypertension Will increase Lisinopril and stop Amlodipine.  Recheck in one and a half weeks.  - lisinopril (PRINIVIL,ZESTRIL) 20 MG tablet; Take 1 tablet (20 mg total) by mouth daily.  Dispense: 30 tablet; Refill: 3  2. Edema Will start medications as below.  - furosemide (LASIX) 20 MG tablet; Take 1 tablet (20 mg total) by mouth daily.  Dispense: 30 tablet; Refill: 3 - potassium chloride (K-DUR,KLOR-CON) 10 MEQ tablet; Take 1 tablet (10 mEq total) by mouth daily.  Dispense: 30 tablet; Refill: 5       [name], MD  [facility]

## 2015-06-26 ENCOUNTER — Telehealth: Payer: Self-pay | Admitting: Family Medicine

## 2015-06-26 DIAGNOSIS — R2681 Unsteadiness on feet: Secondary | ICD-10-CM | POA: Diagnosis not present

## 2015-06-26 DIAGNOSIS — M6281 Muscle weakness (generalized): Secondary | ICD-10-CM | POA: Diagnosis not present

## 2015-06-28 DIAGNOSIS — M6281 Muscle weakness (generalized): Secondary | ICD-10-CM | POA: Diagnosis not present

## 2015-06-28 DIAGNOSIS — R2681 Unsteadiness on feet: Secondary | ICD-10-CM | POA: Diagnosis not present

## 2015-07-03 ENCOUNTER — Telehealth: Payer: Self-pay | Admitting: Family Medicine

## 2015-07-03 DIAGNOSIS — I10 Essential (primary) hypertension: Secondary | ICD-10-CM

## 2015-07-03 DIAGNOSIS — R2681 Unsteadiness on feet: Secondary | ICD-10-CM | POA: Diagnosis not present

## 2015-07-03 DIAGNOSIS — M6281 Muscle weakness (generalized): Secondary | ICD-10-CM | POA: Diagnosis not present

## 2015-07-03 NOTE — Telephone Encounter (Signed)
This is a pt of Dr. Santiago Bur and she stated she was supposed to call the office to get a lab slip so she can go Monday 07/08/15 to get her thyroid rechecked. Pt would like to pick up a lab slip. I advised that Dr. Elease Hashimoto is out of the office this week. Thanks TNP

## 2015-07-03 NOTE — Telephone Encounter (Signed)
Per OV on 06/11/15, Dr. Venia Minks wanted to check Met C due to adding HCTZ. Since then, patient has also started Lasix and potassium supplement. Ok to order? Please advise. Thanks!

## 2015-07-04 NOTE — Telephone Encounter (Signed)
Ok thx.

## 2015-07-04 NOTE — Telephone Encounter (Signed)
Called pt regarding the note below, pt stated that she did not know about it; pt also stated that she has a follow-up appointment on 07/08/2015 at 10:45 am, which is showing cancelled?  She stated that she will be coming tot the appointment.  Thanks,

## 2015-07-08 ENCOUNTER — Ambulatory Visit: Payer: Medicare Other | Admitting: Family Medicine

## 2015-07-08 ENCOUNTER — Ambulatory Visit (INDEPENDENT_AMBULATORY_CARE_PROVIDER_SITE_OTHER): Payer: Medicare Other | Admitting: Family Medicine

## 2015-07-08 ENCOUNTER — Encounter: Payer: Self-pay | Admitting: Family Medicine

## 2015-07-08 VITALS — BP 128/80 | HR 72 | Temp 98.2°F | Resp 16 | Wt 133.0 lb

## 2015-07-08 DIAGNOSIS — I10 Essential (primary) hypertension: Secondary | ICD-10-CM

## 2015-07-08 DIAGNOSIS — R609 Edema, unspecified: Secondary | ICD-10-CM | POA: Diagnosis not present

## 2015-07-08 NOTE — Progress Notes (Signed)
Patient ID: Maria Bush, female   DOB: [birth year], 81 y.o.   MRN: [id number]         Patient: Maria Bush Female    DOB: February 23, 1930   81 y.o.   MRN: [id number] Visit Date: 07/08/2015  Today's Provider: [name], MD   Chief Complaint  Patient presents with  . Hypertension  . Edema   Subjective:    HPI Comments: Last office visit, pt stopped amlodipine and increased Lisinopril.    Hypertension This is a chronic problem. The problem is unchanged. The problem is controlled (Pt reports her home  BP are 120's/80's). Associated symptoms include neck pain (Seems to be improving with Physical Therapy.). Pertinent negatives include no chest pain or palpitations. There are no compliance problems.     Edema: Patient complains of edema. The location of the edema is lower leg(s) bilateral.  The edema has been off and on.  Onset of symptoms was 1 month ago, gradually improving since that time. The edema is present intermittently. The patient states the problem is intermittent for 1 month.  The swelling has been aggravated by nothing, relieved by diuretics, and been associated with nothing. Cardiac risk factors include hypertension.  Pt recently started Lasix 20mg  and Potassium 10 Meq a day.  She reports some improvement with the added medication, but is still having occasional swelling especially after a long car ride this past week.        Allergies  Allergen Reactions  . Cephalexin Shortness Of Breath  . Prednisone Nausea And Vomiting  . Sulfa Antibiotics Itching  . Penicillins Rash   Previous Medications   ASPIRIN 81 MG TABLET    Take 81 mg by mouth daily.   ATENOLOL (TENORMIN) 25 MG TABLET    TAKE ONE (1) TABLET EACH DAY   CALCIUM CARB-CHOLECALCIFEROL (CALCIUM 600 + D PO)    Take by mouth.   FISH OIL-OMEGA-3 FATTY ACIDS 1000 MG CAPSULE    Take 2 g by mouth daily.   FLUTICASONE (FLONASE) 50 MCG/ACT NASAL SPRAY    Place into the nose.   FUROSEMIDE (LASIX) 20 MG TABLET     Take 1 tablet (20 mg total) by mouth daily.   LISINOPRIL (PRINIVIL,ZESTRIL) 20 MG TABLET    Take 1 tablet (20 mg total) by mouth daily.   LORATADINE (CLARITIN) 10 MG TABLET    Take by mouth.   MELOXICAM (MOBIC) 15 MG TABLET    TAKE ONE (1) TABLET EACH DAY   MIRTAZAPINE (REMERON) 30 MG TABLET       MULTIPLE MINERALS-VITAMINS (CALCIUM & VIT D3 BONE HEALTH PO)    Take 1 tablet by mouth daily.   OMEPRAZOLE (PRILOSEC) 40 MG CAPSULE       POTASSIUM CHLORIDE (K-DUR,KLOR-CON) 10 MEQ TABLET    Take 1 tablet (10 mEq total) by mouth daily.   TRAVOPROST, BAK FREE, (TRAVATAN Z) 0.004 % SOLN OPHTHALMIC SOLUTION    Apply to eye.    Review of Systems  Constitutional: Negative.   Respiratory: Negative.   Cardiovascular: Positive for leg swelling (Comes and goes, but seems to be improving over all). Negative for chest pain and palpitations.  Gastrointestinal: Negative.   Musculoskeletal: Positive for myalgias, neck pain (Seems to be improving with Physical Therapy.) and neck stiffness. Negative for back pain, joint swelling, arthralgias and gait problem.  Neurological: Negative.     Social History  Substance Use Topics  . Smoking status: Never Smoker   . Smokeless tobacco: Never Used  .  Alcohol Use: No   Objective:   BP 128/80 mmHg  Pulse 72  Temp(Src) 98.2 F (36.8 C) (Oral)  Resp 16  Wt 133 lb (60.328 kg)  Physical Exam  Constitutional: She is oriented to person, place, and time. She appears well-developed and well-nourished.  Cardiovascular: Normal rate.   Pulmonary/Chest: Effort normal and breath sounds normal.  Musculoskeletal: She exhibits edema.  Neurological: She is alert and oriented to person, place, and time.  Skin: Skin is warm and dry.  Psychiatric: She has a normal mood and affect.        Assessment & Plan:     1. Essential hypertension Condition is stable. Please continue current medication and  plan of care as noted.   - Comprehensive metabolic panel  2.  Edema Condition is stable. Please continue current medication and  plan of care as noted.  Will check labs to evaluate kidney function and potassium.  - Comprehensive metabolic panel       [name], MD  [facility]

## 2015-07-09 ENCOUNTER — Telehealth: Payer: Self-pay

## 2015-07-09 LAB — COMPREHENSIVE METABOLIC PANEL
ALK PHOS: 56 IU/L (ref 39–117)
ALT: 22 IU/L (ref 0–32)
AST: 30 IU/L (ref 0–40)
Albumin/Globulin Ratio: 1.6 (ref 1.1–2.5)
Albumin: 4.3 g/dL (ref 3.5–4.7)
BUN/Creatinine Ratio: 29 — ABNORMAL HIGH (ref 11–26)
BUN: 21 mg/dL (ref 8–27)
Bilirubin Total: 0.4 mg/dL (ref 0.0–1.2)
CALCIUM: 9.4 mg/dL (ref 8.7–10.3)
CO2: 28 mmol/L (ref 18–29)
CREATININE: 0.72 mg/dL (ref 0.57–1.00)
Chloride: 101 mmol/L (ref 97–108)
GFR calc Af Amer: 90 mL/min/{1.73_m2} (ref >59)
GFR, EST NON AFRICAN AMERICAN: 78 mL/min/{1.73_m2} (ref >59)
Globulin, Total: 2.7 g/dL (ref 1.5–4.5)
Glucose: 95 mg/dL (ref 65–99)
POTASSIUM: 5.2 mmol/L (ref 3.5–5.2)
Sodium: 141 mmol/L (ref 134–144)
Total Protein: 7 g/dL (ref 6.0–8.5)

## 2015-07-09 NOTE — Telephone Encounter (Signed)
Tried calling; no answer.  07/09/2015   Thanks,   -Vernona Rieger

## 2015-07-09 NOTE — Telephone Encounter (Signed)
-----   Message from [name], MD sent at 07/09/2015  7:31 AM EDT ----- Labs stable. Please notify patient. Thanks.

## 2015-07-09 NOTE — Telephone Encounter (Signed)
Pt advised.   Thanks,   -Ashaya Raftery  

## 2015-07-10 DIAGNOSIS — R2681 Unsteadiness on feet: Secondary | ICD-10-CM | POA: Diagnosis not present

## 2015-07-10 DIAGNOSIS — M6281 Muscle weakness (generalized): Secondary | ICD-10-CM | POA: Diagnosis not present

## 2015-07-18 DIAGNOSIS — M6281 Muscle weakness (generalized): Secondary | ICD-10-CM | POA: Diagnosis not present

## 2015-07-18 DIAGNOSIS — R2681 Unsteadiness on feet: Secondary | ICD-10-CM | POA: Diagnosis not present

## 2015-07-25 DIAGNOSIS — H4011X3 Primary open-angle glaucoma, severe stage: Secondary | ICD-10-CM | POA: Diagnosis not present

## 2015-07-26 DIAGNOSIS — R2681 Unsteadiness on feet: Secondary | ICD-10-CM | POA: Diagnosis not present

## 2015-07-26 DIAGNOSIS — M6281 Muscle weakness (generalized): Secondary | ICD-10-CM | POA: Diagnosis not present

## 2015-08-01 DIAGNOSIS — M6281 Muscle weakness (generalized): Secondary | ICD-10-CM | POA: Diagnosis not present

## 2015-08-01 DIAGNOSIS — R2681 Unsteadiness on feet: Secondary | ICD-10-CM | POA: Diagnosis not present

## 2015-08-08 DIAGNOSIS — M6281 Muscle weakness (generalized): Secondary | ICD-10-CM | POA: Diagnosis not present

## 2015-08-08 DIAGNOSIS — R2681 Unsteadiness on feet: Secondary | ICD-10-CM | POA: Diagnosis not present

## 2015-08-16 DIAGNOSIS — M6281 Muscle weakness (generalized): Secondary | ICD-10-CM | POA: Diagnosis not present

## 2015-08-16 DIAGNOSIS — R2681 Unsteadiness on feet: Secondary | ICD-10-CM | POA: Diagnosis not present

## 2015-08-17 DIAGNOSIS — M6281 Muscle weakness (generalized): Secondary | ICD-10-CM | POA: Diagnosis not present

## 2015-08-17 DIAGNOSIS — R2681 Unsteadiness on feet: Secondary | ICD-10-CM | POA: Diagnosis not present

## 2015-08-23 DIAGNOSIS — M6281 Muscle weakness (generalized): Secondary | ICD-10-CM | POA: Diagnosis not present

## 2015-08-23 DIAGNOSIS — R2681 Unsteadiness on feet: Secondary | ICD-10-CM | POA: Diagnosis not present

## 2015-08-28 DIAGNOSIS — M6281 Muscle weakness (generalized): Secondary | ICD-10-CM | POA: Diagnosis not present

## 2015-08-28 DIAGNOSIS — R2681 Unsteadiness on feet: Secondary | ICD-10-CM | POA: Diagnosis not present

## 2015-08-30 DIAGNOSIS — R2681 Unsteadiness on feet: Secondary | ICD-10-CM | POA: Diagnosis not present

## 2015-08-30 DIAGNOSIS — M6281 Muscle weakness (generalized): Secondary | ICD-10-CM | POA: Diagnosis not present

## 2015-08-31 ENCOUNTER — Other Ambulatory Visit: Payer: Self-pay | Admitting: Family Medicine

## 2015-08-31 DIAGNOSIS — D509 Iron deficiency anemia, unspecified: Secondary | ICD-10-CM

## 2015-09-02 DIAGNOSIS — R2681 Unsteadiness on feet: Secondary | ICD-10-CM | POA: Diagnosis not present

## 2015-09-02 DIAGNOSIS — M6281 Muscle weakness (generalized): Secondary | ICD-10-CM | POA: Diagnosis not present

## 2015-09-24 ENCOUNTER — Ambulatory Visit (INDEPENDENT_AMBULATORY_CARE_PROVIDER_SITE_OTHER): Payer: Medicare Other | Admitting: Family Medicine

## 2015-09-24 ENCOUNTER — Encounter: Payer: Self-pay | Admitting: Family Medicine

## 2015-09-24 VITALS — BP 162/74 | HR 70 | Temp 97.9°F | Resp 18 | Wt 135.0 lb

## 2015-09-24 DIAGNOSIS — D509 Iron deficiency anemia, unspecified: Secondary | ICD-10-CM

## 2015-09-24 DIAGNOSIS — Z23 Encounter for immunization: Secondary | ICD-10-CM

## 2015-09-24 DIAGNOSIS — R609 Edema, unspecified: Secondary | ICD-10-CM

## 2015-09-24 DIAGNOSIS — R0602 Shortness of breath: Secondary | ICD-10-CM

## 2015-09-24 MED ORDER — FUROSEMIDE 20 MG PO TABS: 20.0000 mg | ORAL_TABLET | Freq: Every day | ORAL | Status: DC

## 2015-09-24 NOTE — Progress Notes (Signed)
Subjective:     Patient ID: Maria Bush, female   DOB: 24-Dec-1929, 81 y.o.   MRN: [id number]  HPI  Chief Complaint  Patient presents with  . Leg Swelling    X 1-2 weeks.    Leg swelling Pt endorses bilateral leg swelling chronically, for years, worsening in the last 6 weeks. Swelling is worse in the mornings, improves slightly during the day. She believes she gained 20 lbs over the last 2 years. She has not been taking Lasix for 1 month because she thought she was taken off of it. She has not tried if raising the leg or compression stockings improves the swelling.  Endorses shortness of breath intermittently, but no significantly worse.  Walks with walker.   No leg pain, numbness, tingling, redness, changes in color. Denies orthopnea, PND, chest pain. No recent weight loss.   Knee pain Endorses cramping pain in knees 6/10 intermittently, R worse than L, worse in the mornings, better after moving around about after breakfast. Also endorses arthritis pain in L shoulder. Knee pain feels similar. Takes ibuprofen for the pain, which helps.   Urinary urgency Endorses urinary urgency, when she "suddenly really has to go" and sometimes does not make it to the bathroom in time. Denies frequency, dysuria, changes in urine color, hematuria, odor, no enuresis. BMs every other day, which is unchanged. She tries to eat fiber to have more regular BMs. No recent antibiotic use. No leakage of urine with coughing, sneezing, laughing.  This has not changed recently.    Patient Active Problem List   Diagnosis Date Noted  . Edema 04/25/2015  . Shortness of breath 04/25/2015  . Unsteady gait 04/25/2015  . Allergic rhinitis 04/24/2015  . Absolute anemia 04/24/2015  . Arthropathia 04/24/2015  . B12 deficiency 04/24/2015  . Atherosclerosis of coronary artery 04/24/2015  . DDD (degenerative disc disease), lumbar 04/24/2015  . Clinical depression 04/24/2015  . Diverticulitis 04/24/2015  . DD (diverticular  disease) 04/24/2015  . Elevated CK 04/24/2015  . Atrial premature depolarization 04/24/2015  . Hypercholesteremia 04/24/2015  . BP (high blood pressure) 04/24/2015  . Anemia, iron deficiency 04/24/2015  . Neutropenia (HCC) 04/24/2015  . 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 Lower esophageal ring 07/12/2014  . Cystocele, midline 09/01/2013  . 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 Family history of malignant neoplasm of breast 02/10/2013  . Diffuse cystic mastopathy 02/10/2013  . Arthritis    Family History  Problem Relation Age of Onset  . Breast cancer Sister   . Early death Mother   . Stroke Father   . Heart disease Brother   . Heart attack Brother   . Heart disease Brother   . Heart attack Brother    Past Surgical History  Procedure Laterality Date  . Cardiac catheterization  2011  . Colonoscopy  1982  . Bladder surgery  1992  . Rectocele repair  1994  . Cataract extraction Bilateral 2015  . Cholecystectomy  08/09/2000  . Breast biopsy  1992  . Abdominal hysterectomy  1981  . Dilation and curettage of uterus  1979   Social History   Social History  . Marital Status: Widowed    Spouse Name: N/A  . Number of Children: 2  . Years of Education: HS   Occupational History  . Retired     Education officer, environmental   Social History Main Topics  . Smoking status: Never Smoker   . Smokeless tobacco: Never Used  .  Alcohol Use: No  . Drug Use: No  . Sexual Activity: Not on file   Other Topics Concern  . Not on file   Social History Narrative   Pt lives in [place]. Her daughter lives with her, with 3 grandchildren [id number], [id number]. Her daughter is a Research scientist (medical)lab technician. Pt takes care of her grandchildren at home.   She endorses her mood is good, and she feels safe at home.   Previous Medications   ASPIRIN 81 MG TABLET    Take 81 mg by mouth daily.   ATENOLOL (TENORMIN) 25 MG TABLET    TAKE ONE (1) TABLET EACH DAY   CALCIUM CARB-CHOLECALCIFEROL  (CALCIUM 600 + D PO)    Take by mouth.   FISH OIL-OMEGA-3 FATTY ACIDS 1000 MG CAPSULE    Take 2 g by mouth daily.   FLUTICASONE (FLONASE) 50 MCG/ACT NASAL SPRAY    Place into the nose.   FUROSEMIDE (LASIX) 20 MG TABLET    Take 1 tablet (20 mg total) by mouth daily.   LISINOPRIL (PRINIVIL,ZESTRIL) 20 MG TABLET    Take 1 tablet (20 mg total) by mouth daily.   LORATADINE (CLARITIN) 10 MG TABLET    Take by mouth.   MELOXICAM (MOBIC) 15 MG TABLET    TAKE ONE (1) TABLET EACH DAY   MIRTAZAPINE (REMERON) 30 MG TABLET       MULTIPLE MINERALS-VITAMINS (CALCIUM & VIT D3 BONE HEALTH PO)    Take 1 tablet by mouth daily.   OMEPRAZOLE (PRILOSEC) 40 MG CAPSULE    TAKE ONE (1) CAPSULE EACH DAY   POTASSIUM CHLORIDE (K-DUR,KLOR-CON) 10 MEQ TABLET    Take 1 tablet (10 mEq total) by mouth daily.   TRAVOPROST, BAK FREE, (TRAVATAN Z) 0.004 % SOLN OPHTHALMIC SOLUTION    Apply to eye.    Allergies  Allergen Reactions  . Cephalexin Shortness Of Breath  . Prednisone Nausea And Vomiting  . Sulfa Antibiotics Itching  . Penicillins Rash   Review of Systems  Constitutional: Negative for fever, activity change, appetite change, fatigue and unexpected weight change.  HENT: Negative.  Negative for congestion and sore throat.   Eyes: Negative.  Negative for pain and discharge.  Respiratory: Positive for shortness of breath. Negative for cough and chest tightness.   Cardiovascular: Positive for leg swelling. Negative for chest pain and palpitations.  Gastrointestinal: Negative for nausea, vomiting, abdominal pain, diarrhea and constipation.  Endocrine: Negative for polyuria.  Genitourinary: Positive for urgency. Negative for dysuria, frequency, hematuria, decreased urine volume, enuresis and difficulty urinating.  Musculoskeletal: Positive for myalgias (intermittent), joint swelling (bilateral knee swelling, R>L) and arthralgias (knees, shoulder).  Skin: Negative.   Allergic/Immunologic: Negative.   Neurological:  Negative for dizziness and light-headedness.  Psychiatric/Behavioral:       Mood is good    BP 162/74 mmHg  Pulse 70  Temp(Src) 97.9 F (36.6 C)  Resp 18  Wt 135 lb (61.236 kg)  SpO2 96%    Objective:   Physical Exam  Constitutional: She appears well-developed and well-nourished. No distress.  HENT:  Head: Normocephalic and atraumatic.  Right Ear: External ear normal.  Left Ear: External ear normal.  Nose: Nose normal.  Mouth/Throat: Oropharynx is clear and moist. No oropharyngeal exudate.  Eyes: EOM are normal. Right eye exhibits no discharge. Left eye exhibits no discharge.  Neck: Normal range of motion. Neck supple.  No JVD  Cardiovascular: Normal rate, regular rhythm and normal heart sounds.  Exam reveals no gallop and no  friction rub.   No murmur heard. Pulmonary/Chest: Effort normal and breath sounds normal. No respiratory distress. She has no wheezes. She has no rales.  Abdominal: Soft. Bowel sounds are normal. There is tenderness (left lower quadrant with deep palpation).  No HSM  Musculoskeletal: Edema: bilateral knee, R>L.  Bilateral lower extremity 2+ pitting edema to below knees  Lymphadenopathy:    She has no cervical adenopathy.  Neurological: She is alert. No cranial nerve deficit. She exhibits normal muscle tone.  Skin: Skin is warm and dry. No rash noted. No erythema. No pallor.  Psychiatric:  Flat affect      Assessment:     SOB and Edema.  Has been worked up previously, but will re-address as below.   Plan:     1. Shortness of breath Will check BNP.  Lungs sound clear today.  - B Nat Peptide  2. Anemia, iron deficiency Will recheck labs.    3. Edema, unspecified type Condition is worsening. Will start medication for better control.  Patient instructed to call back if condition worsens or does not improve.    - CBC with Differential/Platelet - Comprehensive metabolic panel - furosemide (LASIX) 20 MG tablet; Take 1 tablet (20 mg total) by mouth  daily.  Dispense: 30 tablet; Refill: 3  4. Need for influenza vaccination Given today.  - Flu vaccine HIGH DOSE PF (Fluzone High dose)  5. Need for pneumococcal vaccination Given today.  - Pneumococcal conjugate vaccine 13-valent   [name], MD

## 2015-09-25 ENCOUNTER — Other Ambulatory Visit: Payer: Self-pay | Admitting: Family Medicine

## 2015-09-25 DIAGNOSIS — R0602 Shortness of breath: Secondary | ICD-10-CM | POA: Diagnosis not present

## 2015-09-25 DIAGNOSIS — M1990 Unspecified osteoarthritis, unspecified site: Secondary | ICD-10-CM

## 2015-09-25 DIAGNOSIS — R609 Edema, unspecified: Secondary | ICD-10-CM | POA: Diagnosis not present

## 2015-09-26 ENCOUNTER — Telehealth: Payer: Self-pay

## 2015-09-26 LAB — CBC WITH DIFFERENTIAL/PLATELET
Basophils Absolute: 0 10*3/uL (ref 0.0–0.2)
Basos: 1 %
EOS (ABSOLUTE): 0.2 10*3/uL (ref 0.0–0.4)
EOS: 3 %
HEMATOCRIT: 34.7 % (ref 34.0–46.6)
Hemoglobin: 11.6 g/dL (ref 11.1–15.9)
IMMATURE GRANS (ABS): 0 10*3/uL (ref 0.0–0.1)
IMMATURE GRANULOCYTES: 0 %
Lymphocytes Absolute: 0.9 10*3/uL (ref 0.7–3.1)
Lymphs: 15 %
MCH: 32.7 pg (ref 26.6–33.0)
MCHC: 33.4 g/dL (ref 31.5–35.7)
MCV: 98 fL — ABNORMAL HIGH (ref 79–97)
MONOS ABS: 0.4 10*3/uL (ref 0.1–0.9)
Monocytes: 7 %
Neutrophils Absolute: 4.6 10*3/uL (ref 1.4–7.0)
Neutrophils: 74 %
PLATELETS: 266 10*3/uL (ref 150–379)
RBC: 3.55 x10E6/uL — AB (ref 3.77–5.28)
RDW: 13.5 % (ref 12.3–15.4)
WBC: 6 10*3/uL (ref 3.4–10.8)

## 2015-09-26 LAB — COMPREHENSIVE METABOLIC PANEL
ALT: 20 IU/L (ref 0–32)
AST: 28 IU/L (ref 0–40)
Albumin/Globulin Ratio: 1.5 (ref 1.1–2.5)
Albumin: 4 g/dL (ref 3.5–4.7)
Alkaline Phosphatase: 55 IU/L (ref 39–117)
BUN/Creatinine Ratio: 25 (ref 11–26)
BUN: 18 mg/dL (ref 8–27)
Bilirubin Total: 0.7 mg/dL (ref 0.0–1.2)
CALCIUM: 9.2 mg/dL (ref 8.7–10.3)
CO2: 25 mmol/L (ref 18–29)
CREATININE: 0.72 mg/dL (ref 0.57–1.00)
Chloride: 101 mmol/L (ref 97–106)
GFR, EST AFRICAN AMERICAN: 90 mL/min/{1.73_m2} (ref >59)
GFR, EST NON AFRICAN AMERICAN: 78 mL/min/{1.73_m2} (ref >59)
GLOBULIN, TOTAL: 2.7 g/dL (ref 1.5–4.5)
Glucose: 113 mg/dL — ABNORMAL HIGH (ref 65–99)
Potassium: 4.3 mmol/L (ref 3.5–5.2)
Sodium: 140 mmol/L (ref 136–144)
TOTAL PROTEIN: 6.7 g/dL (ref 6.0–8.5)

## 2015-09-26 LAB — BRAIN NATRIURETIC PEPTIDE: BNP: 173.1 pg/mL — ABNORMAL HIGH (ref 0.0–100.0)

## 2015-09-26 NOTE — Telephone Encounter (Signed)
Pt advised.   Thanks,   -Lameshia Hypolite  

## 2015-09-26 NOTE — Telephone Encounter (Signed)
-----   Message from Lorie PhenixNancy Maloney, MD sent at 09/26/2015  2:06 PM EST ----- Labs stable. Please notify patient. Thanks.

## 2015-10-02 ENCOUNTER — Ambulatory Visit: Payer: Medicare Other | Admitting: Family Medicine

## 2015-10-09 ENCOUNTER — Encounter: Payer: Self-pay | Admitting: Family Medicine

## 2015-10-09 ENCOUNTER — Ambulatory Visit (INDEPENDENT_AMBULATORY_CARE_PROVIDER_SITE_OTHER): Payer: Medicare Other | Admitting: Family Medicine

## 2015-10-09 VITALS — BP 148/72 | HR 68 | Temp 98.8°F | Resp 16 | Wt 138.0 lb

## 2015-10-09 DIAGNOSIS — D509 Iron deficiency anemia, unspecified: Secondary | ICD-10-CM | POA: Diagnosis not present

## 2015-10-09 DIAGNOSIS — K648 Other hemorrhoids: Secondary | ICD-10-CM | POA: Diagnosis not present

## 2015-10-09 DIAGNOSIS — R600 Localized edema: Secondary | ICD-10-CM | POA: Diagnosis not present

## 2015-10-09 LAB — IFOBT (OCCULT BLOOD): IFOBT: NEGATIVE

## 2015-10-09 MED ORDER — HYDROCORTISONE 2.5 % RE CREA: 1.0000 "application " | TOPICAL_CREAM | Freq: Two times a day (BID) | RECTAL | Status: DC

## 2015-10-09 NOTE — Progress Notes (Signed)
Patient ID: Maria Bush Polivka, female   DOB: 07-07-1930, 81 y.o.   MRN: [id number]       Patient: Maria Bush Hedtke Female    DOB: 07-07-1930   81 y.o.   MRN: [id number] Visit Date: 10/09/2015  Today's Provider: Lorie PhenixNancy Reinhold Rickey, MD   Chief Complaint  Patient presents with  . Leg Swelling    2 week follow up.    Subjective:    HPI Leg Swelling Patient comes in today to F/U on bilateral swelling in her lower legs. Patient was started on Lasix 20mg  daily, and she reports that this did help with swelling. She also tolerated medication well. Does not need her medication refilled today.  Does need her labs redone.    Blood in stool Patient reports that she has noticed bright red blood in her stool and after wiping for about 2 weeks. Patient reports that it doesn't happen every BM, but it she does notice blood once a day. Patient also mentions that she has a lot of gas and feels bloated. Patient's last colonoscopy was in 2007 with diverticulosis. Patient denies having black stools, nausea, or vomiting.     Allergies  Allergen Reactions  . Cephalexin Shortness Of Breath  . Prednisone Nausea And Vomiting  . Sulfa Antibiotics Itching  . Penicillins Rash   Previous Medications   ASPIRIN 81 MG TABLET    Take 81 mg by mouth daily.   ATENOLOL (TENORMIN) 25 MG TABLET    TAKE ONE (1) TABLET EACH DAY   CALCIUM CARB-CHOLECALCIFEROL (CALCIUM 600 + D PO)    Take by mouth.   FISH OIL-OMEGA-3 FATTY ACIDS 1000 MG CAPSULE    Take 2 g by mouth daily.   FLUTICASONE (FLONASE) 50 MCG/ACT NASAL SPRAY    Place into the nose.   FUROSEMIDE (LASIX) 20 MG TABLET    Take 1 tablet (20 mg total) by mouth daily.   LISINOPRIL (PRINIVIL,ZESTRIL) 20 MG TABLET    Take 1 tablet (20 mg total) by mouth daily.   LORATADINE (CLARITIN) 10 MG TABLET    Take by mouth.   MELOXICAM (MOBIC) 15 MG TABLET    TAKE ONE (1) TABLET EACH DAY   MIRTAZAPINE (REMERON) 30 MG TABLET       MULTIPLE MINERALS-VITAMINS (CALCIUM & VIT D3 BONE  HEALTH PO)    Take 1 tablet by mouth daily.   OMEPRAZOLE (PRILOSEC) 40 MG CAPSULE    TAKE ONE (1) CAPSULE EACH DAY   POTASSIUM CHLORIDE (K-DUR,KLOR-CON) 10 MEQ TABLET    Take 1 tablet (10 mEq total) by mouth daily.   TRAVOPROST, BAK FREE, (TRAVATAN Z) 0.004 % SOLN OPHTHALMIC SOLUTION    Apply to eye.    Review of Systems  Constitutional: Negative.   Respiratory: Negative.   Cardiovascular: Negative.   Gastrointestinal: Positive for blood in stool. Negative for nausea, vomiting, abdominal pain, diarrhea, constipation, abdominal distention, anal bleeding and rectal pain.  Genitourinary: Negative.   Skin: Negative.   Neurological: Negative.     Social History  Substance Use Topics  . Smoking status: Never Smoker   . Smokeless tobacco: Never Used  . Alcohol Use: No   Objective:   BP 148/72 mmHg  Pulse 68  Temp(Src) 98.8 F (37.1 C)  Resp 16  Wt 138 lb (62.596 kg)  Physical Exam  Constitutional: She is oriented to person, place, and time. She appears well-developed and well-nourished.  Genitourinary: Guaiac negative stool.  Rectal exam normal. Anoscope exam showed internal hemorrhoids.  Neurological: She is alert and oriented to person, place, and time.      Assessment & Plan:     1. Anemia, iron deficiency Will check labs.  Did have some bleeding, will check CBC.   - CBC with Differential/Platelet - Comprehensive Metabolic Panel (CMET)  2. Localized edema Stable.  Continue current medication.  Will check labs.   - CBC with Differential/Platelet - Comprehensive Metabolic Panel (CMET)  3. Internal hemorrhoids New problem. Noted today.   Will try cream.  Patient instructed to call back if condition worsens or does not improve.    - hydrocortisone (ANUSOL-HC) 2.5 % rectal cream; Place 1 application rectally 2 (two) times daily.  Dispense: 30 g; Refill: 0 - IFOBT POC (occult bld, rslt in office) Results for orders placed or performed in visit on 10/09/15  IFOBT POC  (occult bld, rslt in office)  Result Value Ref Range   IFOBT Negative       [name], MD  [facility]

## 2015-10-10 LAB — COMPREHENSIVE METABOLIC PANEL
A/G RATIO: 1.4 (ref 1.1–2.5)
ALBUMIN: 3.8 g/dL (ref 3.5–4.7)
ALK PHOS: 52 IU/L (ref 39–117)
ALT: 19 IU/L (ref 0–32)
AST: 28 IU/L (ref 0–40)
BILIRUBIN TOTAL: 0.7 mg/dL (ref 0.0–1.2)
BUN / CREAT RATIO: 34 — AB (ref 11–26)
BUN: 25 mg/dL (ref 8–27)
CHLORIDE: 99 mmol/L (ref 97–106)
CO2: 25 mmol/L (ref 18–29)
CREATININE: 0.74 mg/dL (ref 0.57–1.00)
Calcium: 9.2 mg/dL (ref 8.7–10.3)
GFR calc Af Amer: 87 mL/min/{1.73_m2} (ref >59)
GFR calc non Af Amer: 75 mL/min/{1.73_m2} (ref >59)
GLOBULIN, TOTAL: 2.8 g/dL (ref 1.5–4.5)
Glucose: 91 mg/dL (ref 65–99)
POTASSIUM: 5.4 mmol/L — AB (ref 3.5–5.2)
SODIUM: 138 mmol/L (ref 136–144)
Total Protein: 6.6 g/dL (ref 6.0–8.5)

## 2015-10-10 LAB — CBC WITH DIFFERENTIAL/PLATELET
Basophils Absolute: 0.1 10*3/uL (ref 0.0–0.2)
Basos: 1 %
EOS (ABSOLUTE): 0.1 10*3/uL (ref 0.0–0.4)
Eos: 3 %
HEMATOCRIT: 33.7 % — AB (ref 34.0–46.6)
HEMOGLOBIN: 11 g/dL — AB (ref 11.1–15.9)
Immature Grans (Abs): 0 10*3/uL (ref 0.0–0.1)
Immature Granulocytes: 0 %
LYMPHS ABS: 1.4 10*3/uL (ref 0.7–3.1)
Lymphs: 29 %
MCH: 32.2 pg (ref 26.6–33.0)
MCHC: 32.6 g/dL (ref 31.5–35.7)
MCV: 99 fL — ABNORMAL HIGH (ref 79–97)
MONOCYTES: 7 %
MONOS ABS: 0.3 10*3/uL (ref 0.1–0.9)
NEUTROS ABS: 2.9 10*3/uL (ref 1.4–7.0)
Neutrophils: 60 %
Platelets: 264 10*3/uL (ref 150–379)
RBC: 3.42 x10E6/uL — AB (ref 3.77–5.28)
RDW: 13.6 % (ref 12.3–15.4)
WBC: 4.8 10*3/uL (ref 3.4–10.8)

## 2015-10-14 ENCOUNTER — Telehealth: Payer: Self-pay

## 2015-10-14 DIAGNOSIS — D649 Anemia, unspecified: Secondary | ICD-10-CM

## 2015-10-14 DIAGNOSIS — E875 Hyperkalemia: Secondary | ICD-10-CM

## 2015-10-14 NOTE — Telephone Encounter (Signed)
Tried calling; no answer 10/14/2015  Thanks,   -Vernona RiegerLaura

## 2015-10-14 NOTE — Telephone Encounter (Signed)
-----  Message from Margarita Rana, MD sent at 10/10/2015  6:09 PM EST ----- Labs stable except for mildly lower CBC and also high potassium. Potassium could be lab error, Repeat met c and CBC. No charge per Gordy Clement and leave copy for Judson Roch. Thanks.

## 2015-10-15 NOTE — Telephone Encounter (Signed)
Pt advised; Lab sheet is at the front desk for her to pick up.   Thanks,    -Vernona RiegerLaura

## 2015-10-17 DIAGNOSIS — E875 Hyperkalemia: Secondary | ICD-10-CM | POA: Diagnosis not present

## 2015-10-17 DIAGNOSIS — D649 Anemia, unspecified: Secondary | ICD-10-CM | POA: Diagnosis not present

## 2015-10-18 ENCOUNTER — Telehealth: Payer: Self-pay

## 2015-10-18 LAB — COMPREHENSIVE METABOLIC PANEL
A/G RATIO: 1.5 (ref 1.1–2.5)
ALBUMIN: 4 g/dL (ref 3.5–4.7)
ALT: 16 IU/L (ref 0–32)
AST: 27 IU/L (ref 0–40)
Alkaline Phosphatase: 57 IU/L (ref 39–117)
BUN/Creatinine Ratio: 22 (ref 11–26)
BUN: 21 mg/dL (ref 8–27)
Bilirubin Total: 0.5 mg/dL (ref 0.0–1.2)
CALCIUM: 9.2 mg/dL (ref 8.7–10.3)
CO2: 24 mmol/L (ref 18–29)
Chloride: 105 mmol/L (ref 97–106)
Creatinine, Ser: 0.95 mg/dL (ref 0.57–1.00)
GFR, EST AFRICAN AMERICAN: 64 mL/min/{1.73_m2} (ref >59)
GFR, EST NON AFRICAN AMERICAN: 56 mL/min/{1.73_m2} — AB (ref >59)
Globulin, Total: 2.7 g/dL (ref 1.5–4.5)
Glucose: 89 mg/dL (ref 65–99)
POTASSIUM: 5.1 mmol/L (ref 3.5–5.2)
Sodium: 143 mmol/L (ref 136–144)
TOTAL PROTEIN: 6.7 g/dL (ref 6.0–8.5)

## 2015-10-18 LAB — CBC WITH DIFFERENTIAL/PLATELET
BASOS: 1 %
Basophils Absolute: 0 10*3/uL (ref 0.0–0.2)
EOS (ABSOLUTE): 0.2 10*3/uL (ref 0.0–0.4)
EOS: 4 %
HEMOGLOBIN: 11.2 g/dL (ref 11.1–15.9)
Hematocrit: 33.6 % — ABNORMAL LOW (ref 34.0–46.6)
IMMATURE GRANS (ABS): 0 10*3/uL (ref 0.0–0.1)
IMMATURE GRANULOCYTES: 0 %
Lymphocytes Absolute: 1.3 10*3/uL (ref 0.7–3.1)
Lymphs: 27 %
MCH: 32.8 pg (ref 26.6–33.0)
MCHC: 33.3 g/dL (ref 31.5–35.7)
MCV: 99 fL — AB (ref 79–97)
Monocytes Absolute: 0.3 10*3/uL (ref 0.1–0.9)
Monocytes: 6 %
NEUTROS ABS: 3 10*3/uL (ref 1.4–7.0)
Neutrophils: 62 %
PLATELETS: 260 10*3/uL (ref 150–379)
RBC: 3.41 x10E6/uL — ABNORMAL LOW (ref 3.77–5.28)
RDW: 13.7 % (ref 12.3–15.4)
WBC: 4.8 10*3/uL (ref 3.4–10.8)

## 2015-10-18 NOTE — Telephone Encounter (Signed)
Pt advised.   Thanks,   -Cianni Manny  

## 2015-10-18 NOTE — Telephone Encounter (Signed)
-----   Message from Lorie PhenixNancy Maloney, MD sent at 10/18/2015  8:11 AM EST ----- Labs stable. Please notify patient Thanks.

## 2015-10-22 DIAGNOSIS — H401222 Low-tension glaucoma, left eye, moderate stage: Secondary | ICD-10-CM | POA: Diagnosis not present

## 2015-10-22 DIAGNOSIS — H401213 Low-tension glaucoma, right eye, severe stage: Secondary | ICD-10-CM | POA: Diagnosis not present

## 2015-10-26 ENCOUNTER — Other Ambulatory Visit: Payer: Self-pay | Admitting: Family Medicine

## 2015-10-26 DIAGNOSIS — I10 Essential (primary) hypertension: Secondary | ICD-10-CM

## 2015-11-06 ENCOUNTER — Encounter: Payer: Medicare Other | Admitting: Family Medicine

## 2015-12-09 ENCOUNTER — Emergency Department: Payer: Medicare Other

## 2015-12-09 ENCOUNTER — Encounter: Payer: Self-pay | Admitting: Emergency Medicine

## 2015-12-09 ENCOUNTER — Ambulatory Visit: Payer: Self-pay | Admitting: Family Medicine

## 2015-12-09 ENCOUNTER — Emergency Department
Admission: EM | Admit: 2015-12-09 | Discharge: 2015-12-09 | Disposition: A | Discharge status: Home / Self Care | Payer: Medicare Other | Attending: Emergency Medicine | Admitting: Emergency Medicine

## 2015-12-09 DIAGNOSIS — Z791 Long term (current) use of non-steroidal anti-inflammatories (NSAID): Secondary | ICD-10-CM | POA: Insufficient documentation

## 2015-12-09 DIAGNOSIS — Y998 Other external cause status: Secondary | ICD-10-CM | POA: Diagnosis not present

## 2015-12-09 DIAGNOSIS — W06XXXA Fall from bed, initial encounter: Secondary | ICD-10-CM | POA: Diagnosis not present

## 2015-12-09 DIAGNOSIS — S0011XA Contusion of right eyelid and periocular area, initial encounter: Secondary | ICD-10-CM | POA: Diagnosis not present

## 2015-12-09 DIAGNOSIS — Y9389 Activity, other specified: Secondary | ICD-10-CM | POA: Diagnosis not present

## 2015-12-09 DIAGNOSIS — Z7951 Long term (current) use of inhaled steroids: Secondary | ICD-10-CM | POA: Insufficient documentation

## 2015-12-09 DIAGNOSIS — Z880 Allergy status to penicillin: Secondary | ICD-10-CM | POA: Insufficient documentation

## 2015-12-09 DIAGNOSIS — Z7982 Long term (current) use of aspirin: Secondary | ICD-10-CM | POA: Insufficient documentation

## 2015-12-09 DIAGNOSIS — Y9289 Other specified places as the place of occurrence of the external cause: Secondary | ICD-10-CM | POA: Diagnosis not present

## 2015-12-09 DIAGNOSIS — Z79899 Other long term (current) drug therapy: Secondary | ICD-10-CM | POA: Insufficient documentation

## 2015-12-09 DIAGNOSIS — S4992XA Unspecified injury of left shoulder and upper arm, initial encounter: Secondary | ICD-10-CM | POA: Diagnosis not present

## 2015-12-09 DIAGNOSIS — I10 Essential (primary) hypertension: Secondary | ICD-10-CM | POA: Insufficient documentation

## 2015-12-09 DIAGNOSIS — M25512 Pain in left shoulder: Secondary | ICD-10-CM | POA: Diagnosis not present

## 2015-12-09 DIAGNOSIS — S4352XA Sprain of left acromioclavicular joint, initial encounter: Secondary | ICD-10-CM | POA: Diagnosis not present

## 2015-12-09 NOTE — ED Provider Notes (Signed)
[facility] Emergency Department Provider Note  ____________________________________________  Time seen: On arrival  I have reviewed the triage vital signs and the nursing notes.   HISTORY  Chief Complaint Fall; Shoulder Pain; and Eye Pain    HPI Maria Bush is a 82 y.o. female who presents after a fall 2 days ago. Patient reports she had a mechanical fall while getting out of bed. At that time she injured her left shoulder and struck her right orbit. She denies visual changes. No chest pain or palpitations. No dizziness. No LOC. She thought injuries were minor but notes that she needs to have left shoulder discomfort so wanted to get checked out today. Her primary complaint is discomfort in the top of her left shoulder when moving her arm.    Past Medical History  Diagnosis Date  . Hypertension   . Heart murmur 2005  . Arthritis 2005    Patient Active Problem List   Diagnosis Date Noted  . Edema 04/25/2015  . Shortness of breath 04/25/2015  . Unsteady gait 04/25/2015  . Allergic rhinitis 04/24/2015  . Absolute anemia 04/24/2015  . Arthropathia 04/24/2015  . B12 deficiency 04/24/2015  . Atherosclerosis of coronary artery 04/24/2015  . DDD (degenerative disc disease), lumbar 04/24/2015  . Clinical depression 04/24/2015  . Diverticulitis 04/24/2015  . DD (diverticular disease) 04/24/2015  . Elevated CK 04/24/2015  . Atrial premature depolarization 04/24/2015  . Hypercholesteremia 04/24/2015  . BP (high blood pressure) 04/24/2015  . Anemia, iron deficiency 04/24/2015  . Neutropenia (HCC) 04/24/2015  . 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 Lower esophageal ring 07/12/2014  . Cystocele, midline 09/01/2013  . 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 Prolapse of vaginal vault after hysterectomy 09/01/2013  . Urge incontinence of urine 09/01/2013  . Family history of malignant neoplasm of breast  02/10/2013  . Diffuse cystic mastopathy 02/10/2013  . Arthritis     Past Surgical History  Procedure Laterality Date  . Cardiac catheterization  2011  . Colonoscopy  1982  . Bladder surgery  1992  . Rectocele repair  1994  . Cataract extraction Bilateral 2015  . Cholecystectomy  08/09/2000  . Breast biopsy  1992  . Abdominal hysterectomy  1981  . Dilation and curettage of uterus  1979    Current Outpatient Rx  Name  Route  Sig  Dispense  Refill  . aspirin 81 MG tablet   Oral   Take 81 mg by mouth daily.         Marland Kitchen atenolol (TENORMIN) 25 MG tablet      TAKE ONE (1) TABLET EACH DAY   90 tablet   3   . Calcium Carb-Cholecalciferol (CALCIUM 600 + D PO)   Oral   Take by mouth.         . fish oil-omega-3 fatty acids 1000 MG capsule   Oral   Take 2 g by mouth daily.         . fluticasone (FLONASE) 50 MCG/ACT nasal spray   Nasal   Place into the nose.         . furosemide (LASIX) 20 MG tablet   Oral   Take 1 tablet (20 mg total) by mouth daily.   30 tablet   3   . hydrocortisone (ANUSOL-HC) 2.5 % rectal cream   Rectal   Place 1 application rectally 2 (two) times daily.   30 g   0   . lisinopril (PRINIVIL,ZESTRIL) 20  MG tablet      TAKE ONE (1) TABLET EACH DAY DOSAGE CHANGE   90 tablet   1   . loratadine (CLARITIN) 10 MG tablet   Oral   Take by mouth.         . meloxicam (MOBIC) 15 MG tablet      TAKE ONE (1) TABLET EACH DAY   30 tablet   5     PT NEEDSREFILLS   . mirtazapine (REMERON) 30 MG tablet               . Multiple Minerals-Vitamins (CALCIUM & VIT D3 BONE HEALTH PO)   Oral   Take 1 tablet by mouth daily.         Marland Kitchen omeprazole (PRILOSEC) 40 MG capsule      TAKE ONE (1) CAPSULE EACH DAY   30 capsule   5   . potassium chloride (K-DUR,KLOR-CON) 10 MEQ tablet   Oral   Take 1 tablet (10 mEq total) by mouth daily.   30 tablet   5   . Travoprost, BAK Free, (TRAVATAN Z) 0.004 % SOLN ophthalmic solution   Ophthalmic    Apply to eye.           Allergies Cephalexin; Prednisone; Sulfa antibiotics; and Penicillins  Family History  Problem Relation Age of Onset  . Breast cancer Sister   . Early death Mother   . Stroke Father   . Heart disease Brother   . Heart attack Brother   . Heart disease Brother   . Heart attack Brother     Social History Social History  Substance Use Topics  . Smoking status: Never Smoker   . Smokeless tobacco: Never Used  . Alcohol Use: No    Review of Systems  Constitutional: Negative for fever. Eyes: Negative for visual changes. ENT: Negative for neck pain    Musculoskeletal: Negative for back pain. As above for shoulder pain Skin: Positive for bruise to right eye Neurological: Negative for headaches or focal weakness   ____________________________________________   PHYSICAL EXAM:  VITAL SIGNS: ED Triage Vitals  Enc Vitals Group     BP 12/09/15 1344 154/71 mmHg     Pulse Rate 12/09/15 1344 60     Resp 12/09/15 1344 20     Temp 12/09/15 1344 98.2 F (36.8 C)     Temp Source 12/09/15 1344 Oral     SpO2 12/09/15 1344 97 %     Weight 12/09/15 1344 138 lb (62.596 kg)     Height 12/09/15 1344 5' (1.524 m)     Head Cir --      Peak Flow --      Pain Score 12/09/15 1345 8     Pain Loc --      Pain Edu? --      Excl. in GC? --      Constitutional: Alert and oriented. Well appearing and in no distress. Eyes: Conjunctivae are normal. Ecchymosis right orbit, no bony abnormalities felt ENT   Head: Normocephalic and atraumatic.   Mouth/Throat: Mucous membranes are moist. Cardiovascular: Normal rate, regular rhythm.  Respiratory: Normal respiratory effort without tachypnea nor retractions.  Gastrointestinal: Soft and non-tender in all quadrants. No distention. There is no CVA tenderness. Musculoskeletal: Nontender with normal range of motion in all extremities. Full range of motion of left arm although painful with full abduction at the [facility]  joint. Tenderness at the [facility] joint as well. No bony abnormalities Neurologic:  Normal speech and  language. No gross focal neurologic deficits are appreciated. Skin:  Skin is warm, dry and intact. No rash noted. Psychiatric: Mood and affect are normal. Patient exhibits appropriate insight and judgment.  ____________________________________________    LABS (pertinent positives/negatives)  Labs Reviewed - No data to display  ____________________________________________     ____________________________________________    RADIOLOGY I have personally reviewed any xrays that were ordered on this patient: Shoulder x-ray shows no acute fracture  ____________________________________________   PROCEDURES  Procedure(s) performed: none   ____________________________________________   INITIAL IMPRESSION / ASSESSMENT AND PLAN / ED COURSE  Pertinent labs & imaging results that were available during my care of the patient were reviewed by me and considered in my medical decision making (see chart for details).  X-ray shows no fracture, exam is most consistent with AC injury. Recommend ice, sling, analgesics and follow-up with her PCP in one week.  ____________________________________________   FINAL CLINICAL IMPRESSION(S) / ED DIAGNOSES  Final diagnoses:  Acromioclavicular joint injury, left, initial encounter     Jene Every, MD 12/09/15 [phone]

## 2015-12-09 NOTE — ED Notes (Signed)
Pt to ed with c/o fall on Saturday.  Reports she was getting out of bed and lost her balance and fell.  Pt states she landed on right side, now with bruising noted to right eye and swelling to right eye.  Pt also reports pain and swelling to left shoulder. Denies loss of consciousness with fall.

## 2015-12-09 NOTE — ED Notes (Signed)
Left arm sling applied by medic. +2 pulses post aplication

## 2015-12-09 NOTE — ED Notes (Signed)
Pt has pain in left shoulder.  States she fell 3 days after getting out of bed to go to bathroom, pt fell onto floor.  No loc. Denies neck or back pain.  Pt alert.

## 2015-12-23 ENCOUNTER — Telehealth: Payer: Self-pay | Admitting: Family Medicine

## 2015-12-23 NOTE — Telephone Encounter (Signed)
Appt scheduled for Thursday 12/26/15. Thanks TNP

## 2015-12-23 NOTE — Telephone Encounter (Signed)
Pt was discharged from [facility] ER on 12/09/2015 for a fall at home/shoulder pain.  Pt called today to schedule a follow up appointment/MW

## 2015-12-23 NOTE — Telephone Encounter (Signed)
Ok to schedule follow up. Thanks.

## 2015-12-26 ENCOUNTER — Encounter: Payer: Self-pay | Admitting: Family Medicine

## 2015-12-26 ENCOUNTER — Ambulatory Visit (INDEPENDENT_AMBULATORY_CARE_PROVIDER_SITE_OTHER): Payer: Medicare Other | Admitting: Family Medicine

## 2015-12-26 VITALS — BP 126/70 | HR 64 | Temp 97.9°F | Resp 16 | Wt 137.0 lb

## 2015-12-26 DIAGNOSIS — M25512 Pain in left shoulder: Secondary | ICD-10-CM | POA: Insufficient documentation

## 2015-12-26 DIAGNOSIS — I10 Essential (primary) hypertension: Secondary | ICD-10-CM

## 2015-12-26 DIAGNOSIS — S46912D Strain of unspecified muscle, fascia and tendon at shoulder and upper arm level, left arm, subsequent encounter: Secondary | ICD-10-CM

## 2015-12-26 NOTE — Patient Instructions (Signed)
Generic Shoulder Exercises  EXERCISES   RANGE OF MOTION (ROM) AND STRETCHING EXERCISES  These exercises may help you when beginning to rehabilitate your injury. Your symptoms may resolve with or without further involvement from your physician, physical therapist or athletic trainer. While completing these exercises, remember:   · Restoring tissue flexibility helps normal motion to return to the joints. This allows healthier, less painful movement and activity.  · An effective stretch should be held for at least 30 seconds.  · A stretch should never be painful. You should only feel a gentle lengthening or release in the stretched tissue.  ROM - Pendulum  · Bend at the waist so that your right / left arm falls away from your body. Support yourself with your opposite hand on a solid surface, such as a table or a countertop.  · Your right / left arm should be perpendicular to the ground. If it is not perpendicular, you need to lean over farther. Relax the muscles in your right / left arm and shoulder as much as possible.  · Gently sway your hips and trunk so they move your right / left arm without any use of your right / left shoulder muscles.  · Progress your movements so that your right / left arm moves side to side, then forward and backward, and finally, both clockwise and counterclockwise.  · Complete __________ repetitions in each direction. Many people use this exercise to relieve discomfort in their shoulder as well as to gain range of motion.  Repeat __________ times. Complete this exercise __________ times per day.  STRETCH - Flexion, Standing  · Stand with good posture. With an underhand grip on your right / left hand and an overhand grip on the opposite hand, grasp a broomstick or cane so that your hands are a little more than shoulder-width apart.  · Keeping your right / left elbow straight and shoulder muscles relaxed, push the stick with your opposite hand to raise your right / left arm in front of your  body and then overhead. Raise your arm until you feel a stretch in your right / left shoulder, but before you have increased shoulder pain.  · Try to avoid shrugging your right / left shoulder as your arm rises by keeping your shoulder blade tucked down and toward your mid-back spine. Hold __________ seconds.  · Slowly return to the starting position.  Repeat __________ times. Complete this exercise __________ times per day.  STRETCH - Internal Rotation  · Place your right / left hand behind your back, palm-up.  · Throw a towel or belt over your opposite shoulder. Grasp the towel/belt with your right / left hand.  · While keeping an upright posture, gently pull up on the towel/belt until you feel a stretch in the front of your right / left shoulder.  · Avoid shrugging your right / left shoulder as your arm rises by keeping your shoulder blade tucked down and toward your mid-back spine.  · Hold __________. Release the stretch by lowering your opposite hand.  Repeat __________ times. Complete this exercise __________ times per day.  STRETCH - External Rotation and Abduction  · Stagger your stance through a doorframe. It does not matter which foot is forward.  · As instructed by your physician, physical therapist or athletic trainer, place your hands:    And forearms above your head and on the door frame.    And forearms at head-height and on the door frame.      At elbow-height and on the door frame.  · Keeping your head and chest upright and your stomach muscles tight to prevent over-extending your low-back, slowly shift your weight onto your front foot until you feel a stretch across your chest and/or in the front of your shoulders.  · Hold __________ seconds. Shift your weight to your back foot to release the stretch.  Repeat __________ times. Complete this stretch __________ times per day.   STRENGTHENING EXERCISES   These exercises may help you when beginning to rehabilitate your injury. They may resolve your  symptoms with or without further involvement from your physician, physical therapist or athletic trainer. While completing these exercises, remember:   · Muscles can gain both the endurance and the strength needed for everyday activities through controlled exercises.  · Complete these exercises as instructed by your physician, physical therapist or athletic trainer. Progress the resistance and repetitions only as guided.  · You may experience muscle soreness or fatigue, but the pain or discomfort you are trying to eliminate should never worsen during these exercises. If this pain does worsen, stop and make certain you are following the directions exactly. If the pain is still present after adjustments, discontinue the exercise until you can discuss the trouble with your clinician.  · If advised by your physician, during your recovery, avoid activity or exercises which involve actions that place your right / left hand or elbow above your head or behind your back or head. These positions stress the tissues which are trying to heal.  STRENGTH - Scapular Depression and Adduction  · With good posture, sit on a firm chair. Supported your arms in front of you with pillows, arm rests or a table top. Have your elbows in line with the sides of your body.  · Gently draw your shoulder blades down and toward your mid-back spine. Gradually increase the tension without tensing the muscles along the top of your shoulders and the back of your neck.  · Hold for __________ seconds. Slowly release the tension and relax your muscles completely before completing the next repetition.  · After you have practiced this exercise, remove the arm support and complete it in standing as well as sitting.  Repeat __________ times. Complete this exercise __________ times per day.   STRENGTH - External Rotators  · Secure a rubber exercise band/tubing to a fixed object so that it is at the same height as your right / left elbow when you are standing  or sitting on a firm surface.  · Stand or sit so that the secured exercise band/tubing is at your side that is not injured.  · Bend your elbow 90 degrees. Place a folded towel or small pillow under your right / left arm so that your elbow is a few inches away from your side.  · Keeping the tension on the exercise band/tubing, pull it away from your body, as if pivoting on your elbow. Be sure to keep your body steady so that the movement is only coming from your shoulder rotating.  · Hold __________ seconds. Release the tension in a controlled manner as you return to the starting position.  Repeat __________ times. Complete this exercise __________ times per day.   STRENGTH - Supraspinatus  · Stand or sit with good posture. Grasp a __________ weight or an exercise band/tubing so that your hand is "thumbs-up," like when you shake hands.  · Slowly lift your right / left hand from your thigh into the air,   traveling about 30 degrees from straight out at your side. Lift your hand to shoulder height or as far as you can without increasing any shoulder pain. Initially, many people do not lift their hands above shoulder height.  · Avoid shrugging your right / left shoulder as your arm rises by keeping your shoulder blade tucked down and toward your mid-back spine.  · Hold for __________ seconds. Control the descent of your hand as you slowly return to your starting position.  Repeat __________ times. Complete this exercise __________ times per day.   STRENGTH - Shoulder Extensors  · Secure a rubber exercise band/tubing so that it is at the height of your shoulders when you are either standing or sitting on a firm arm-less chair.  · With a thumbs-up grip, grasp an end of the band/tubing in each hand. Straighten your elbows and lift your hands straight in front of you at shoulder height. Step back away from the secured end of band/tubing until it becomes tense.  · Squeezing your shoulder blades together, pull your hands down  to the sides of your thighs. Do not allow your hands to go behind you.  · Hold for __________ seconds. Slowly ease the tension on the band/tubing as you reverse the directions and return to the starting position.  Repeat __________ times. Complete this exercise __________ times per day.   STRENGTH - Scapular Retractors  · Secure a rubber exercise band/tubing so that it is at the height of your shoulders when you are either standing or sitting on a firm arm-less chair.  · With a palm-down grip, grasp an end of the band/tubing in each hand. Straighten your elbows and lift your hands straight in front of you at shoulder height. Step back away from the secured end of band/tubing until it becomes tense.  · Squeezing your shoulder blades together, draw your elbows back as you bend them. Keep your upper arm lifted away from your body throughout the exercise.  · Hold __________ seconds. Slowly ease the tension on the band/tubing as you reverse the directions and return to the starting position.  Repeat __________ times. Complete this exercise __________ times per day.  STRENGTH - Scapular Depressors  · Find a sturdy chair without wheels, such as a from a dining room table.  · Keeping your feet on the floor, lift your bottom from the seat and lock your elbows.  · Keeping your elbows straight, allow gravity to pull your body weight down. Your shoulders will rise toward your ears.  · Raise your body against gravity by drawing your shoulder blades down your back, shortening the distance between your shoulders and ears. Although your feet should always maintain contact with the floor, your feet should progressively support less body weight as you get stronger.  · Hold __________ seconds. In a controlled and slow manner, lower your body weight to begin the next repetition.  Repeat __________ times. Complete this exercise __________ times per day.      This information is not intended to replace advice given to you by your health  care provider. Make sure you discuss any questions you have with your health care provider.     Document Released: 09/16/2005 Document Revised: 11/23/2014 Document Reviewed: 02/14/2009  Elsevier Interactive Patient Education ©2016 [employer]

## 2015-12-26 NOTE — Progress Notes (Signed)
Patient ID: Maria Bush, female   DOB: Oct 03, 1930, 82 y.o.   MRN: [id number]         Patient: Maria Bush Female    DOB: May 16, 1930   82 y.o.   MRN: [id number] Visit Date: 12/26/2015  Today's Provider: [name], MD   Chief Complaint  Patient presents with  . Fall  . Shoulder Pain    Left for a fall on 12/09/2015   Subjective:    Fall The accident occurred more than 1 week ago. The fall occurred from a bed. She landed on carpet. There was no blood loss. The point of impact was the head and left shoulder. The pain is present in the left shoulder. The pain is at a severity of 7/10. The symptoms are aggravated by use of injured limb. Pertinent negatives include no abdominal pain, fever, headaches, nausea, numbness, tingling or vomiting. She has tried acetaminophen and NSAID for the symptoms. The treatment provided moderate relief.  Shoulder Pain  The pain is present in the left shoulder. This is a new problem. The current episode started 1 to 4 weeks ago. There has been a history of trauma. The problem occurs constantly. The problem has been gradually improving. The quality of the pain is described as aching and dull. The pain is at a severity of 7/10. Associated symptoms include a limited range of motion and stiffness. Pertinent negatives include no fever, joint locking, joint swelling, numbness or tingling. The symptoms are aggravated by activity. She has tried acetaminophen and NSAIDS for the symptoms. The treatment provided mild relief.      Allergies  Allergen Reactions  . Cephalexin Shortness Of Breath  . Prednisone Nausea And Vomiting  . Sulfa Antibiotics Itching  . Penicillins Rash      Review of Systems  Constitutional: Negative for fever, chills, diaphoresis, activity change, appetite change, fatigue and unexpected weight change.  Cardiovascular: Positive for leg swelling. Negative for chest pain and palpitations.  Gastrointestinal: Negative for nausea,  vomiting, abdominal pain, diarrhea, constipation, blood in stool, abdominal distention, anal bleeding and rectal pain.  Musculoskeletal: Positive for arthralgias (Left shoulder pain) and stiffness. Negative for myalgias, back pain, joint swelling, gait problem, neck pain and neck stiffness.  Neurological: Negative for dizziness, tingling, light-headedness, numbness and headaches.   Previous Medications   ASPIRIN 81 MG TABLET    Take 81 mg by mouth daily.   ATENOLOL (TENORMIN) 25 MG TABLET    TAKE ONE (1) TABLET EACH DAY   CALCIUM CARB-CHOLECALCIFEROL (CALCIUM 600 + D PO)    Take by mouth.   FISH OIL-OMEGA-3 FATTY ACIDS 1000 MG CAPSULE    Take 2 g by mouth daily.   FLUTICASONE (FLONASE) 50 MCG/ACT NASAL SPRAY    Place into the nose.   FUROSEMIDE (LASIX) 20 MG TABLET    Take 1 tablet (20 mg total) by mouth daily.   HYDROCORTISONE (ANUSOL-HC) 2.5 % RECTAL CREAM    Place 1 application rectally 2 (two) times daily.   LORATADINE (CLARITIN) 10 MG TABLET    Take by mouth.   MELOXICAM (MOBIC) 15 MG TABLET    TAKE ONE (1) TABLET EACH DAY   MIRTAZAPINE (REMERON) 30 MG TABLET       MULTIPLE MINERALS-VITAMINS (CALCIUM & VIT D3 BONE HEALTH PO)    Take 1 tablet by mouth daily.   OMEPRAZOLE (PRILOSEC) 40 MG CAPSULE    TAKE ONE (1) CAPSULE EACH DAY   POTASSIUM CHLORIDE (K-DUR,KLOR-CON) 10 MEQ TABLET  Take 1 tablet (10 mEq total) by mouth daily.   TRAVOPROST, BAK FREE, (TRAVATAN Z) 0.004 % SOLN OPHTHALMIC SOLUTION    Apply to eye.       Social History  Substance Use Topics  . Smoking status: Never Smoker   . Smokeless tobacco: Never Used  . Alcohol Use: No   Objective:   BP 126/70 mmHg  Pulse 64  Temp(Src) 97.9 F (36.6 C) (Oral)  Resp 16  Wt 137 lb (62.143 kg)  Physical Exam  Constitutional: She is oriented to person, place, and time. She appears well-developed and well-nourished.  Cardiovascular: Normal rate, regular rhythm, normal heart sounds and intact distal pulses.   Pulmonary/Chest:  Effort normal and breath sounds normal.  Musculoskeletal:       Left shoulder: She exhibits decreased range of motion (Left worse then right. ).  Passive movement able to raise right shoulder but not left.    Neurological: She is alert and oriented to person, place, and time.  Psychiatric: She has a normal mood and affect. Her behavior is normal. Judgment and thought content normal.      Assessment & Plan:     1. Left shoulder pain As below.  2. Left shoulder strain, subsequent encounter New problem. From a recent fall.  Pt reports some improvement but still has decreased range of motion.  She is going to try home exercises and if not improved will refer to PT.   Patient was seen and examined by Leo Grosser, MD, and note scribed by Kavin Leech, CMA.  I have reviewed the document for accuracy and completeness and I agree with above. - Leo Grosser, MD   [name], MD  [facility]

## 2015-12-27 MED ORDER — LISINOPRIL 20 MG PO TABS: 20.0000 mg | ORAL_TABLET | Freq: Every day | ORAL | Status: DC

## 2016-01-15 DIAGNOSIS — H1045 Other chronic allergic conjunctivitis: Secondary | ICD-10-CM | POA: Diagnosis not present

## 2016-01-15 DIAGNOSIS — H04123 Dry eye syndrome of bilateral lacrimal glands: Secondary | ICD-10-CM | POA: Diagnosis not present

## 2016-01-16 ENCOUNTER — Other Ambulatory Visit: Payer: Self-pay | Admitting: Family Medicine

## 2016-01-24 ENCOUNTER — Encounter: Payer: Self-pay | Admitting: Family Medicine

## 2016-01-24 ENCOUNTER — Ambulatory Visit (INDEPENDENT_AMBULATORY_CARE_PROVIDER_SITE_OTHER): Payer: Medicare Other | Admitting: Family Medicine

## 2016-01-24 VITALS — BP 110/70 | HR 62 | Temp 98.7°F | Resp 16 | Ht 59.0 in | Wt 133.0 lb

## 2016-01-24 DIAGNOSIS — I10 Essential (primary) hypertension: Secondary | ICD-10-CM

## 2016-01-24 DIAGNOSIS — Z1231 Encounter for screening mammogram for malignant neoplasm of breast: Secondary | ICD-10-CM

## 2016-01-24 DIAGNOSIS — Z0000 Encounter for general adult medical examination without abnormal findings: Secondary | ICD-10-CM | POA: Diagnosis not present

## 2016-01-24 DIAGNOSIS — Z780 Asymptomatic menopausal state: Secondary | ICD-10-CM | POA: Diagnosis not present

## 2016-01-24 DIAGNOSIS — D509 Iron deficiency anemia, unspecified: Secondary | ICD-10-CM | POA: Diagnosis not present

## 2016-01-24 DIAGNOSIS — E785 Hyperlipidemia, unspecified: Secondary | ICD-10-CM

## 2016-01-24 NOTE — Progress Notes (Signed)
Patient ID: Maria Bush, female   DOB: 1930/03/03, 82 y.o.   MRN: [id number]       Patient: Maria Bush, Female    DOB: 11/25/1929, 82 y.o.   MRN: [id number] Visit Date: 01/24/2016  Today's Provider: [name], MD   Chief Complaint  Patient presents with  . Medicare Wellness   Subjective:    Annual wellness visit Maria Bush is a 82 y.o. female. She feels well. She reports exercising 3 days a weeks. She reports she is sleeping well. 09/12/12 CPE 01/29/14 Mammogram-BI-RADS 2 08/16/06 Colonoscopy-diverticulosis 02/06/13 BMD-normal  Lab Results  Component Value Date   WBC 4.8 10/17/2015   HGB 12.9 08/14/2014   HCT 33.6* 10/17/2015   PLT 260 10/17/2015   GLUCOSE 89 10/17/2015   ALT 16 10/17/2015   AST 27 10/17/2015   NA 143 10/17/2015   K 5.1 10/17/2015   CL 105 10/17/2015   CREATININE 0.95 10/17/2015   BUN 21 10/17/2015   CO2 24 10/17/2015   TSH 0.911 07/22/2012    -----------------------------------------------------------   Review of Systems  Constitutional: Positive for chills.  HENT: Negative.   Eyes: Positive for itching.  Respiratory: Positive for shortness of breath.   Cardiovascular: Negative.   Gastrointestinal: Positive for constipation.  Endocrine: Negative.   Genitourinary: Negative.   Musculoskeletal: Positive for arthralgias.  Skin: Negative.   Allergic/Immunologic: Negative.   Neurological: Negative.   Hematological: Negative.   Psychiatric/Behavioral: Negative.     Social History   Social History  . Marital Status: Widowed    Spouse Name: N/A  . Number of Children: 2  . Years of Education: HS   Occupational History  . Retired     Education officer, environmental   Social History Main Topics  . Smoking status: Never Smoker   . Smokeless tobacco: Never Used  . Alcohol Use: No  . Drug Use: No  . Sexual Activity: Not on file   Other Topics Concern  . Not on file   Social History Narrative   Pt lives in [place]. Her  daughter lives with her, with 3 grandchildren 53, 5, 1. Her daughter is a Research scientist (medical). Pt takes care of her grandchildren at home.     Past Medical History  Diagnosis Date  . Hypertension   . Heart murmur 2005  . Arthritis 2005     Patient Active Problem List   Diagnosis Date Noted  . Left shoulder pain 12/26/2015  . Edema 04/25/2015  . Shortness of breath 04/25/2015  . Unsteady gait 04/25/2015  . Allergic rhinitis 04/24/2015  . Arthropathia 04/24/2015  . B12 deficiency 04/24/2015  . Atherosclerosis of coronary artery 04/24/2015  . DDD (degenerative disc disease), lumbar 04/24/2015  . Clinical depression 04/24/2015  . DD (diverticular disease) 04/24/2015  . Elevated CK 04/24/2015  . Atrial premature depolarization 04/24/2015  . Hypercholesteremia 04/24/2015  . BP (high blood pressure) 04/24/2015  . Anemia, iron deficiency 04/24/2015  . Neutropenia (HCC) 04/24/2015  . 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 Lower esophageal ring 07/12/2014  . HLD (hyperlipidemia) 07/12/2014  . 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 Urge incontinence of urine 09/01/2013  . Family history of malignant neoplasm of breast 02/10/2013  . Diffuse cystic mastopathy 02/10/2013  . Arthritis     Past Surgical History  Procedure Laterality Date  . Cardiac catheterization  2011  . Colonoscopy  1982  . Bladder surgery  1992  . Rectocele repair  1994  . Cataract  extraction Bilateral 2015  . Cholecystectomy  08/09/2000  . Breast biopsy  1992  . Abdominal hysterectomy  1981  . Dilation and curettage of uterus  1979    Her family history includes Breast cancer in her sister; Early death in her mother; Heart attack in her brother and brother; Heart disease in her brother and brother; Stroke in her father.    Previous Medications   ASPIRIN 81 MG TABLET    Take 81 mg by mouth daily.   ATENOLOL (TENORMIN) 25 MG TABLET    TAKE ONE (1) TABLET EACH DAY    CALCIUM CARB-CHOLECALCIFEROL (CALCIUM 600 + D PO)    Take by mouth.   FLUTICASONE (FLONASE) 50 MCG/ACT NASAL SPRAY    Place into the nose.   FUROSEMIDE (LASIX) 20 MG TABLET    Take 1 tablet (20 mg total) by mouth daily.   HYDROCORTISONE (ANUSOL-HC) 2.5 % RECTAL CREAM    Place 1 application rectally 2 (two) times daily.   LISINOPRIL (PRINIVIL,ZESTRIL) 20 MG TABLET    Take 1 tablet (20 mg total) by mouth daily.   LORATADINE (CLARITIN) 10 MG TABLET    Take by mouth.   MELOXICAM (MOBIC) 15 MG TABLET    TAKE ONE (1) TABLET EACH DAY   MULTIPLE MINERALS-VITAMINS (CALCIUM & VIT D3 BONE HEALTH PO)    Take 1 tablet by mouth daily.   OMEPRAZOLE (PRILOSEC) 40 MG CAPSULE    TAKE ONE (1) CAPSULE EACH DAY   POTASSIUM CHLORIDE (K-DUR) 10 MEQ TABLET    TAKE ONE (1) TABLET EACH DAY   TRAVOPROST, BAK FREE, (TRAVATAN Z) 0.004 % SOLN OPHTHALMIC SOLUTION    Apply to eye.    Patient Care Team: Lorie PhenixNancy Nollie Terlizzi, MD as PCP - General (Family Medicine) Seeplaputhur Wynona LunaG Sankar, MD (General Surgery)     Objective:   Vitals: BP 110/70 mmHg  Pulse 62  Temp(Src) 98.7 F (37.1 C) (Oral)  Resp 16  Ht 4\' 11"  (1.499 m)  Wt 133 lb (60.328 kg)  BMI 26.85 kg/m2  SpO2 100%  Physical Exam  Constitutional: She is oriented to person, place, and time. She appears well-developed and well-nourished.  HENT:  Head: Normocephalic and atraumatic.  Right Ear: Tympanic membrane, external ear and ear canal normal.  Left Ear: Tympanic membrane, external ear and ear canal normal.  Nose: Nose normal.  Mouth/Throat: Uvula is midline, oropharynx is clear and moist and mucous membranes are normal.  Eyes: Conjunctivae, EOM and lids are normal. Pupils are equal, round, and reactive to light.  Neck: Trachea normal and normal range of motion. Neck supple. Carotid bruit is not present. No thyroid mass and no thyromegaly present.  Cardiovascular: Normal rate, regular rhythm and normal heart sounds.   Pulmonary/Chest: Effort normal and breath  sounds normal.  Abdominal: Soft. Normal appearance and bowel sounds are normal. There is no hepatosplenomegaly. There is no tenderness.  Musculoskeletal: Normal range of motion.  Lymphadenopathy:    She has no cervical adenopathy.    She has no axillary adenopathy.  Neurological: She is alert and oriented to person, place, and time. She has normal strength. No cranial nerve deficit.  Skin: Skin is warm, dry and intact.  Psychiatric: She has a normal mood and affect. Her speech is normal and behavior is normal. Judgment and thought content normal. Cognition and memory are normal.    Activities of Daily Living In your present state of health, do you have any difficulty performing the following activities: 01/24/2016  Hearing? N  Vision? N  Difficulty concentrating or making decisions? Y  Walking or climbing stairs? Y  Dressing or bathing? N  Doing errands, shopping? N    Fall Risk Assessment Fall Risk  01/24/2016 04/25/2015  Falls in the past year? Yes Yes  Number falls in past yr: 2 or more 2 or more  Injury with Fall? No No  Risk Factor Category  - High Fall Risk     Depression Screen PHQ 2/9 Scores 01/24/2016 04/25/2015  PHQ - 2 Score 0 0    Cognitive Testing - 6-CIT  Correct? Score   What year is it? yes 0 0 or 4  What month is it? yes 0 0 or 3  Memorize:    Floyde Parkins,  44,  [address]  [place],      What time is it? (within 1 hour) yes 0 0 or 3  Count backwards from 20 yes 0 0, 2, or 4  Name the months of the year yes 0 0, 2, or 4  Repeat name & address above no 4 0, 2, 4, 6, 8, or 10       TOTAL SCORE  4/28   Interpretation:  Normal  Normal (0-7) Abnormal (8-28)       Assessment & Plan:     Annual Wellness Visit  Reviewed patient's Family Medical History Reviewed and updated list of patient's medical providers Assessment of cognitive impairment was done Assessed patient's functional ability Established a written schedule for health screening  services Health Risk Assessent Completed and Reviewed  Exercise Activities and Dietary recommendations Goals    None      Immunization History  Administered Date(s) Administered  . Influenza, High Dose Seasonal PF 09/24/2015  . Pneumococcal Conjugate-13 09/24/2015  . Pneumococcal Polysaccharide-23 09/29/2004  . Td 07/22/2007       1. Medicare annual wellness visit, subsequent Stable. Patient advised to continue eating healthy and exercise daily.  Will follow up with Dr. Sherrie Mustache. Her husband saw him and her son still sees him.   2. Postmenopausal - DG Bone Density; Future  3. Essential hypertension - Comprehensive metabolic panel - TSH  4. HLD (hyperlipidemia) - Lipid Panel With LDL/HDL Ratio  5. Anemia, iron deficiency - CBC with Differential/Platelet  6. Encounter for screening mammogram for breast cancer - MM DIGITAL SCREENING BILATERAL; Future    Patient seen and examined by Dr. Leo Grosser, and note scribed by Liz Beach. Dimas, CMA.  I have reviewed the document for accuracy and completeness and I agree with above. Leo Grosser, MD   [name], MD   ------------------------------------------------------------------------------------------------------------

## 2016-01-29 DIAGNOSIS — D509 Iron deficiency anemia, unspecified: Secondary | ICD-10-CM | POA: Diagnosis not present

## 2016-01-29 DIAGNOSIS — E785 Hyperlipidemia, unspecified: Secondary | ICD-10-CM | POA: Diagnosis not present

## 2016-01-29 DIAGNOSIS — I10 Essential (primary) hypertension: Secondary | ICD-10-CM | POA: Diagnosis not present

## 2016-01-30 ENCOUNTER — Telehealth: Payer: Self-pay

## 2016-01-30 LAB — LIPID PANEL WITH LDL/HDL RATIO
Cholesterol, Total: 152 mg/dL (ref 100–199)
HDL: 55 mg/dL (ref >39)
LDL Calculated: 89 mg/dL (ref 0–99)
LDL/HDL RATIO: 1.6 ratio (ref 0.0–3.2)
Triglycerides: 40 mg/dL (ref 0–149)
VLDL CHOLESTEROL CAL: 8 mg/dL (ref 5–40)

## 2016-01-30 LAB — CBC WITH DIFFERENTIAL/PLATELET
BASOS: 1 %
Basophils Absolute: 0 10*3/uL (ref 0.0–0.2)
EOS (ABSOLUTE): 0.2 10*3/uL (ref 0.0–0.4)
EOS: 5 %
HEMATOCRIT: 33.7 % — AB (ref 34.0–46.6)
HEMOGLOBIN: 11.5 g/dL (ref 11.1–15.9)
IMMATURE GRANS (ABS): 0 10*3/uL (ref 0.0–0.1)
IMMATURE GRANULOCYTES: 0 %
LYMPHS: 33 %
Lymphocytes Absolute: 1.3 10*3/uL (ref 0.7–3.1)
MCH: 33.2 pg — ABNORMAL HIGH (ref 26.6–33.0)
MCHC: 34.1 g/dL (ref 31.5–35.7)
MCV: 97 fL (ref 79–97)
MONOCYTES: 9 %
MONOS ABS: 0.4 10*3/uL (ref 0.1–0.9)
NEUTROS PCT: 52 %
Neutrophils Absolute: 2.1 10*3/uL (ref 1.4–7.0)
Platelets: 250 10*3/uL (ref 150–379)
RBC: 3.46 x10E6/uL — AB (ref 3.77–5.28)
RDW: 13.8 % (ref 12.3–15.4)
WBC: 4 10*3/uL (ref 3.4–10.8)

## 2016-01-30 LAB — COMPREHENSIVE METABOLIC PANEL
ALBUMIN: 4.1 g/dL (ref 3.5–4.7)
ALK PHOS: 57 IU/L (ref 39–117)
ALT: 12 IU/L (ref 0–32)
AST: 22 IU/L (ref 0–40)
Albumin/Globulin Ratio: 1.5 (ref 1.2–2.2)
BILIRUBIN TOTAL: 0.5 mg/dL (ref 0.0–1.2)
BUN / CREAT RATIO: 29 — AB (ref 11–26)
BUN: 26 mg/dL (ref 8–27)
CALCIUM: 9.3 mg/dL (ref 8.7–10.3)
CHLORIDE: 101 mmol/L (ref 96–106)
CO2: 26 mmol/L (ref 18–29)
Creatinine, Ser: 0.9 mg/dL (ref 0.57–1.00)
GFR, EST AFRICAN AMERICAN: 68 mL/min/{1.73_m2} (ref >59)
GFR, EST NON AFRICAN AMERICAN: 59 mL/min/{1.73_m2} — AB (ref >59)
Globulin, Total: 2.8 g/dL (ref 1.5–4.5)
Glucose: 99 mg/dL (ref 65–99)
Potassium: 4.8 mmol/L (ref 3.5–5.2)
Sodium: 142 mmol/L (ref 134–144)
Total Protein: 6.9 g/dL (ref 6.0–8.5)

## 2016-01-30 LAB — TSH: TSH: 2.37 u[IU]/mL (ref 0.450–4.500)

## 2016-01-30 NOTE — Telephone Encounter (Signed)
-----   Message from Lorie PhenixNancy Maloney, MD sent at 01/30/2016  7:10 AM EDT ----- Labs stable. Please notify patient.  Thanks.

## 2016-01-30 NOTE — Telephone Encounter (Signed)
Pt advised.   Thanks,   -Laura  

## 2016-02-10 ENCOUNTER — Telehealth: Payer: Self-pay | Admitting: *Deleted

## 2016-02-10 NOTE — Telephone Encounter (Signed)
Unable to leave a message for patient. I was calling her to get Dr.Sankars appointment rescheduled. She was suppose to come on 02/11/16 for f/u mammo. Patient r/s her mammo to 02/13/16

## 2016-02-11 ENCOUNTER — Ambulatory Visit: Payer: Medicare Other | Admitting: General Surgery

## 2016-02-13 DIAGNOSIS — Z1231 Encounter for screening mammogram for malignant neoplasm of breast: Secondary | ICD-10-CM | POA: Diagnosis not present

## 2016-02-14 ENCOUNTER — Encounter: Payer: Self-pay | Admitting: General Surgery

## 2016-02-19 ENCOUNTER — Ambulatory Visit: Payer: Self-pay | Admitting: General Surgery

## 2016-02-21 ENCOUNTER — Ambulatory Visit (INDEPENDENT_AMBULATORY_CARE_PROVIDER_SITE_OTHER): Payer: Medicare Other | Admitting: Family Medicine

## 2016-02-21 ENCOUNTER — Encounter: Payer: Self-pay | Admitting: Family Medicine

## 2016-02-21 VITALS — BP 126/82 | HR 64 | Temp 97.8°F | Resp 16 | Wt 129.4 lb

## 2016-02-21 DIAGNOSIS — M25512 Pain in left shoulder: Secondary | ICD-10-CM | POA: Diagnosis not present

## 2016-02-21 NOTE — Patient Instructions (Signed)
Continue meloxicam and Tylenol up to 3000 mg/day pending orthopedic evaluation.

## 2016-02-21 NOTE — Progress Notes (Signed)
Subjective:     Patient ID: Maria Bush, female   DOB: May 01, 1930, 82 y.o.   MRN: [id number]  HPI  Chief Complaint  Patient presents with  . Shoulder Pain    Patient returns to office today to readdress pain in her left shoulder. Patient was last seen in office 12/26/15 after a reported fall, at time of visit it was determined that patient had shoulder strain. Patient was advised lasst visit to try home exercise and if no improvement we would refer to PT. Today patient states pain has been moe persistant these past few weeks, she describes pain as sharp. Patient has been taking otc Tylenol for relief.   Also remains on meloxicam daily. Reports Tylenol does help her pain. Denies another. X-ray @ ER visit in January showed degenerative changes but no fracture.   Review of Systems     Objective:   Physical Exam  Constitutional: She appears well-developed and well-nourished. No distress (ambulating well with a rolling walker ).  Musculoskeletal:  Tender over her left deltoid. Left grip/EF/EE/ IR/ER/ shoulder flexion/ext 5/5. Resists muscle testing with abduction. Resists active and passive ROM in flexion and abduction due to pain.       Assessment:    1. Left shoulder pain - Ambulatory referral to Orthopedic Surgery    Plan:    Continue Tylenol and meloxicam.

## 2016-02-24 ENCOUNTER — Ambulatory Visit: Payer: Medicare Other

## 2016-02-24 ENCOUNTER — Other Ambulatory Visit: Payer: Medicare Other

## 2016-02-29 ENCOUNTER — Other Ambulatory Visit: Payer: Self-pay | Admitting: Family Medicine

## 2016-02-29 DIAGNOSIS — I10 Essential (primary) hypertension: Secondary | ICD-10-CM

## 2016-03-12 ENCOUNTER — Encounter: Payer: Self-pay | Admitting: *Deleted

## 2016-03-18 DIAGNOSIS — H401213 Low-tension glaucoma, right eye, severe stage: Secondary | ICD-10-CM | POA: Diagnosis not present

## 2016-03-18 DIAGNOSIS — H401222 Low-tension glaucoma, left eye, moderate stage: Secondary | ICD-10-CM | POA: Diagnosis not present

## 2016-03-23 DIAGNOSIS — M75102 Unspecified rotator cuff tear or rupture of left shoulder, not specified as traumatic: Secondary | ICD-10-CM | POA: Diagnosis not present

## 2016-03-23 DIAGNOSIS — M19012 Primary osteoarthritis, left shoulder: Secondary | ICD-10-CM | POA: Diagnosis not present

## 2016-04-01 ENCOUNTER — Ambulatory Visit: Payer: Medicare Other | Admitting: General Surgery

## 2016-04-07 ENCOUNTER — Other Ambulatory Visit: Payer: Self-pay | Admitting: Family Medicine

## 2016-04-07 DIAGNOSIS — R600 Localized edema: Secondary | ICD-10-CM

## 2016-04-21 ENCOUNTER — Encounter: Payer: Self-pay | Admitting: General Surgery

## 2016-04-21 ENCOUNTER — Ambulatory Visit (INDEPENDENT_AMBULATORY_CARE_PROVIDER_SITE_OTHER): Payer: Medicare Other | Admitting: General Surgery

## 2016-04-21 VITALS — BP 122/74 | HR 76 | Resp 16 | Ht 60.0 in | Wt 143.0 lb

## 2016-04-21 DIAGNOSIS — Z803 Family history of malignant neoplasm of breast: Secondary | ICD-10-CM

## 2016-04-21 DIAGNOSIS — N6019 Diffuse cystic mastopathy of unspecified breast: Secondary | ICD-10-CM

## 2016-04-21 NOTE — Progress Notes (Signed)
Patient ID: Maria Bush, female   DOB: 1930-10-23, 82 y.o.   MRN: [id number]  Chief Complaint  Patient presents with  . Follow-up    mammogram    HPI Maria Bush is a 82 y.o. female who presents for a breast evaluation. The most recent mammogram was done on 03/17/16 .  Patient does perform regular self breast checks and gets regular mammograms done.  Pt reports no breast changes or complaints at this time.  I have reviewed the history of present illness with the patient.   HPI  Past Medical History  Diagnosis Date  . Hypertension   . Heart murmur 2005  . Arthritis 2005    Past Surgical History  Procedure Laterality Date  . Cardiac catheterization  2011  . Colonoscopy  1982  . Bladder surgery  1992  . Rectocele repair  1994  . Cataract extraction Bilateral 2015  . Cholecystectomy  08/09/2000  . Breast biopsy  1992  . Abdominal hysterectomy  1981  . Dilation and curettage of uterus  1979    Family History  Problem Relation Age of Onset  . Breast cancer Sister   . Early death Mother   . Stroke Father   . Heart disease Brother   . Heart attack Brother   . Heart disease Brother   . Heart attack Brother     Social History Social History  Substance Use Topics  . Smoking status: Never Smoker   . Smokeless tobacco: Never Used  . Alcohol Use: No    Allergies  Allergen Reactions  . Cephalexin Shortness Of Breath  . Prednisone Nausea And Vomiting  . Sulfa Antibiotics Itching  . Penicillins Rash    Current Outpatient Prescriptions  Medication Sig Dispense Refill  . amLODipine (NORVASC) 2.5 MG tablet Take 1 tablet (2.5 mg total) by mouth daily. 90 tablet 1  . aspirin 81 MG tablet Take 81 mg by mouth daily.    Marland Kitchen atenolol (TENORMIN) 25 MG tablet TAKE ONE (1) TABLET EACH DAY 90 tablet 3  . Calcium Carb-Cholecalciferol (CALCIUM 600 + D PO) Take by mouth.    . fluticasone (FLONASE) 50 MCG/ACT nasal spray Place into the nose.    . furosemide (LASIX) 20 MG  tablet Take 1 tablet (20 mg total) by mouth daily. 30 tablet 3  . furosemide (LASIX) 20 MG tablet TAKE ONE (1) TABLET EACH DAY 90 tablet 1  . hydrocortisone (ANUSOL-HC) 2.5 % rectal cream Place 1 application rectally 2 (two) times daily. 30 g 0  . lisinopril (PRINIVIL,ZESTRIL) 20 MG tablet Take 1 tablet (20 mg total) by mouth daily. 90 tablet 1  . loratadine (CLARITIN) 10 MG tablet Take by mouth.    . meloxicam (MOBIC) 15 MG tablet TAKE ONE (1) TABLET EACH DAY 30 tablet 5  . Multiple Minerals-Vitamins (CALCIUM & VIT D3 BONE HEALTH PO) Take 1 tablet by mouth daily.    Marland Kitchen omeprazole (PRILOSEC) 40 MG capsule TAKE ONE (1) CAPSULE EACH DAY 30 capsule 5  . potassium chloride (K-DUR) 10 MEQ tablet TAKE ONE (1) TABLET EACH DAY 90 tablet 1  . Travoprost, BAK Free, (TRAVATAN Z) 0.004 % SOLN ophthalmic solution Apply to eye.     No current facility-administered medications for this visit.    Review of Systems Review of Systems  Constitutional: Negative.   Respiratory: Negative.   Cardiovascular: Negative.   Gastrointestinal: Negative.     Blood pressure 122/74, pulse 76, resp. rate 16, height 5' (1.524 m), weight 143  lb (64.864 kg).  Physical Exam Physical Exam  Constitutional: She is oriented to person, place, and time. She appears well-developed and well-nourished.  Ambulates with aid of walker  Eyes: Conjunctivae are normal. No scleral icterus.  Neck: Neck supple.  Cardiovascular: Normal rate, regular rhythm and normal heart sounds.   Pulmonary/Chest: Effort normal and breath sounds normal. Right breast exhibits no inverted nipple, no mass, no nipple discharge, no skin change and no tenderness. Left breast exhibits no inverted nipple, no mass, no nipple discharge, no skin change and no tenderness.  Abdominal: Soft. Bowel sounds are normal. There is no tenderness.  Lymphadenopathy:    She has no cervical adenopathy.    She has no axillary adenopathy.  Neurological: She is alert and oriented  to person, place, and time.  Skin: Skin is warm and dry.    Data Reviewed Mammogram reviewed and is stable.   Assessment    Family history of breast cancer, FCD and stable at this time.      Plan    Due to patient's age, discussed plan to continue with annual breast exams but discontinue annual mammograms.   If there are any abnormalities on exam at next appointment, then will proceed with mammogram. Patient is agreeable with this plan. Pt encouraged to SBE.        PCP:  Malva LimesFisher, Donald E This information has been scribed by Ples SpecterJessica Qualls CMA.     Dorrine Montone G 04/21/2016, 4:14 PM

## 2016-04-21 NOTE — Patient Instructions (Addendum)
The patient is aware to call back for any questions or concerns. Return for breast check with no mammogram one year.

## 2016-04-22 ENCOUNTER — Other Ambulatory Visit: Payer: Self-pay | Admitting: Family Medicine

## 2016-04-22 DIAGNOSIS — I10 Essential (primary) hypertension: Secondary | ICD-10-CM

## 2016-05-01 ENCOUNTER — Encounter: Payer: Self-pay | Admitting: Family Medicine

## 2016-05-01 ENCOUNTER — Ambulatory Visit (INDEPENDENT_AMBULATORY_CARE_PROVIDER_SITE_OTHER): Payer: Medicare Other | Admitting: Family Medicine

## 2016-05-01 VITALS — BP 130/78 | HR 67 | Temp 98.1°F | Resp 16 | Wt 132.0 lb

## 2016-05-01 DIAGNOSIS — D509 Iron deficiency anemia, unspecified: Secondary | ICD-10-CM

## 2016-05-01 DIAGNOSIS — E538 Deficiency of other specified B group vitamins: Secondary | ICD-10-CM

## 2016-05-01 DIAGNOSIS — I10 Essential (primary) hypertension: Secondary | ICD-10-CM | POA: Diagnosis not present

## 2016-05-01 NOTE — Progress Notes (Signed)
Patient: Maria Bush Female    DOB: August 15, 1930   82 y.o.   MRN: [id number] Visit Date: 05/01/2016  Today's Provider: Mila Merryonald Athalie Newhard, MD   Chief Complaint  Patient presents with  . Hypertension    follow up  . Hyperlipidemia    follow up  . Anemia    follow up   Subjective:    HPI  Hypertension, follow-up:  BP Readings from Last 3 Encounters:  04/21/16 122/74  02/21/16 126/82  01/24/16 110/70    She was last seen for hypertension 3 months ago.  BP at that visit was 110/70. Management since that visit includes no changes. She reports good compliance with treatment. She is not having side effects.  She is exercising. She is adherent to low salt diet.   Outside blood pressures are 115/75. She is experiencing none.  Patient denies chest pain, chest pressure/discomfort, claudication, dyspnea, exertional chest pressure/discomfort, fatigue, irregular heart beat, lower extremity edema, near-syncope and orthopnea.   Cardiovascular risk factors include advanced age (older than 5855 for men, 2065 for women) and hypertension.  Use of agents associated with hypertension: NSAIDS.     Weight trend: decreasing steadily Wt Readings from Last 3 Encounters:  04/21/16 143 lb (64.864 kg)  02/21/16 129 lb 6.4 oz (58.695 kg)  01/24/16 133 lb (60.328 kg)    Current diet: in general, a "healthy" diet    ------------------------------------------------------------------------   Lipid/Cholesterol, Follow-up:   Last seen for this3 months ago.  Management changes since that visit include none. . Last Lipid Panel:    Component Value Date/Time   CHOL 152 01/29/2016 1036   TRIG 40 01/29/2016 1036   HDL 55 01/29/2016 1036   LDLCALC 89 01/29/2016 1036    Risk factors for vascular disease include hypertension  She reports good compliance with treatment. She is not having side effects.  Current symptoms include none and have been stable. Weight trend: decreasing  steadily Prior visit with dietician: no Current diet: in general, a "healthy" diet   Current exercise: leg lifts  Wt Readings from Last 3 Encounters:  04/21/16 143 lb (64.864 kg)  02/21/16 129 lb 6.4 oz (58.695 kg)  01/24/16 133 lb (60.328 kg)    ------------------------------------------------------------------- Follow up Iron Deficiency Anemia: Patient was last seen for this problem 3 months ago and no changes were made. Patient reports good compliance with treatment.  Lab Results  Component Value Date   WBC 4.0 01/29/2016   HGB 12.9 08/14/2014   HCT 33.7* 01/29/2016   MCV 97 01/29/2016   PLT 250 01/29/2016       Allergies  Allergen Reactions  . Cephalexin Shortness Of Breath  . Prednisone Nausea And Vomiting  . Sulfa Antibiotics Itching  . Penicillins Rash   Current Meds  Medication Sig  . amLODipine (NORVASC) 2.5 MG tablet Take 1 tablet (2.5 mg total) by mouth daily.  Marland Kitchen. aspirin 81 MG tablet Take 81 mg by mouth daily.  Marland Kitchen. atenolol (TENORMIN) 25 MG tablet TAKE ONE (1) TABLET EACH DAY  . Calcium Carb-Cholecalciferol (CALCIUM 600 + D PO) Take by mouth.  . fluticasone (FLONASE) 50 MCG/ACT nasal spray Place into the nose.  . furosemide (LASIX) 20 MG tablet TAKE ONE (1) TABLET EACH DAY  . lisinopril (PRINIVIL,ZESTRIL) 20 MG tablet Take 1 tablet (20 mg total) by mouth daily.  Marland Kitchen. loratadine (CLARITIN) 10 MG tablet Take by mouth.  . meloxicam (MOBIC) 15 MG tablet TAKE ONE (1) TABLET EACH DAY  .  Multiple Minerals-Vitamins (CALCIUM & VIT D3 BONE HEALTH PO) Take 1 tablet by mouth daily.  Marland Kitchen omeprazole (PRILOSEC) 40 MG capsule TAKE ONE (1) CAPSULE EACH DAY  . potassium chloride (K-DUR) 10 MEQ tablet TAKE ONE (1) TABLET EACH DAY  . Travoprost, BAK Free, (TRAVATAN Z) 0.004 % SOLN ophthalmic solution Apply to eye.  . [DISCONTINUED] furosemide (LASIX) 20 MG tablet Take 1 tablet (20 mg total) by mouth daily.  . [DISCONTINUED] hydrocortisone (ANUSOL-HC) 2.5 % rectal cream Place 1  application rectally 2 (two) times daily.  . [DISCONTINUED] lisinopril (PRINIVIL,ZESTRIL) 20 MG tablet Take 1 tablet (20 mg total) by mouth daily.    Review of Systems  Constitutional: Negative for fever, chills, appetite change and fatigue.  Respiratory: Negative for chest tightness and shortness of breath.   Cardiovascular: Negative for chest pain and palpitations.  Gastrointestinal: Negative for nausea, vomiting and abdominal pain.  Musculoskeletal: Positive for arthralgias (left shoulder).  Neurological: Negative for dizziness and weakness.    Social History  Substance Use Topics  . Smoking status: Never Smoker   . Smokeless tobacco: Never Used  . Alcohol Use: No   Objective:   BP 130/78 mmHg  Pulse 67  Temp(Src) 98.1 F (36.7 C) (Oral)  Resp 16  Wt 132 lb (59.875 kg)  SpO2 98%  Physical Exam   General Appearance:    Alert, cooperative, no distress. Frail elderly female. Requires walker   Eyes:    PERRL, conjunctiva/corneas clear, EOM's intact       Lungs:     Clear to auscultation bilaterally, respirations unlabored  Heart:    Regular rate and rhythm  Neurologic:   Awake, alert, oriented x 3. No apparent focal neurological           defect.           Assessment & Plan:     1. Anemia, iron deficiency Normalized on daily MVI only  2. B12 deficiency   3. Essential hypertension Well controlled.  Continue current medications.    Follow up 4 months.         Mila Merry, MD  [facility]

## 2016-05-15 ENCOUNTER — Other Ambulatory Visit: Payer: Self-pay | Admitting: Orthopedic Surgery

## 2016-05-15 DIAGNOSIS — M25512 Pain in left shoulder: Secondary | ICD-10-CM

## 2016-05-23 ENCOUNTER — Other Ambulatory Visit: Payer: Self-pay | Admitting: Family Medicine

## 2016-05-23 DIAGNOSIS — M1990 Unspecified osteoarthritis, unspecified site: Secondary | ICD-10-CM

## 2016-06-03 ENCOUNTER — Ambulatory Visit
Admission: RE | Admit: 2016-06-03 | Discharge: 2016-06-03 | Disposition: A | Discharge status: Home / Self Care | Payer: Medicare Other | Source: Ambulatory Visit | Attending: Orthopedic Surgery | Admitting: Orthopedic Surgery

## 2016-06-03 DIAGNOSIS — M62512 Muscle wasting and atrophy, not elsewhere classified, left shoulder: Secondary | ICD-10-CM | POA: Diagnosis not present

## 2016-06-03 DIAGNOSIS — M75122 Complete rotator cuff tear or rupture of left shoulder, not specified as traumatic: Secondary | ICD-10-CM | POA: Diagnosis not present

## 2016-06-03 DIAGNOSIS — M25512 Pain in left shoulder: Secondary | ICD-10-CM | POA: Diagnosis not present

## 2016-06-03 DIAGNOSIS — M75112 Incomplete rotator cuff tear or rupture of left shoulder, not specified as traumatic: Secondary | ICD-10-CM | POA: Diagnosis not present

## 2016-06-03 DIAGNOSIS — M25412 Effusion, left shoulder: Secondary | ICD-10-CM | POA: Insufficient documentation

## 2016-06-09 ENCOUNTER — Other Ambulatory Visit: Payer: Self-pay | Admitting: *Deleted

## 2016-06-09 DIAGNOSIS — I10 Essential (primary) hypertension: Secondary | ICD-10-CM

## 2016-06-09 MED ORDER — ATENOLOL 25 MG PO TABS: ORAL_TABLET | ORAL | 3 refills | Status: DC

## 2016-06-24 DIAGNOSIS — M25512 Pain in left shoulder: Secondary | ICD-10-CM | POA: Diagnosis not present

## 2016-06-24 DIAGNOSIS — M19012 Primary osteoarthritis, left shoulder: Secondary | ICD-10-CM | POA: Diagnosis not present

## 2016-06-24 DIAGNOSIS — M75122 Complete rotator cuff tear or rupture of left shoulder, not specified as traumatic: Secondary | ICD-10-CM | POA: Diagnosis not present

## 2016-08-05 ENCOUNTER — Other Ambulatory Visit: Payer: Self-pay | Admitting: Family Medicine

## 2016-09-19 ENCOUNTER — Other Ambulatory Visit: Payer: Self-pay | Admitting: Family Medicine

## 2016-09-19 DIAGNOSIS — I10 Essential (primary) hypertension: Secondary | ICD-10-CM

## 2016-10-26 ENCOUNTER — Other Ambulatory Visit: Payer: Self-pay | Admitting: Family Medicine

## 2016-10-26 DIAGNOSIS — I10 Essential (primary) hypertension: Secondary | ICD-10-CM

## 2016-10-26 DIAGNOSIS — R609 Edema, unspecified: Secondary | ICD-10-CM

## 2016-12-07 ENCOUNTER — Other Ambulatory Visit: Payer: Self-pay | Admitting: *Deleted

## 2016-12-07 DIAGNOSIS — M1990 Unspecified osteoarthritis, unspecified site: Secondary | ICD-10-CM

## 2016-12-07 MED ORDER — MELOXICAM 15 MG PO TABS: ORAL_TABLET | ORAL | 5 refills | Status: DC

## 2016-12-16 ENCOUNTER — Other Ambulatory Visit: Payer: Self-pay | Admitting: Family Medicine

## 2016-12-16 DIAGNOSIS — M1990 Unspecified osteoarthritis, unspecified site: Secondary | ICD-10-CM

## 2017-01-23 ENCOUNTER — Other Ambulatory Visit: Payer: Self-pay | Admitting: Family Medicine

## 2017-02-02 DIAGNOSIS — H6123 Impacted cerumen, bilateral: Secondary | ICD-10-CM | POA: Diagnosis not present

## 2017-02-10 ENCOUNTER — Telehealth: Payer: Self-pay | Admitting: Family Medicine

## 2017-02-10 NOTE — Telephone Encounter (Signed)
Called Pt to schedule AWV with [facility] - knb °

## 2017-03-08 ENCOUNTER — Encounter: Payer: Self-pay | Admitting: Family Medicine

## 2017-03-08 ENCOUNTER — Ambulatory Visit (INDEPENDENT_AMBULATORY_CARE_PROVIDER_SITE_OTHER): Payer: Medicare Other | Admitting: Family Medicine

## 2017-03-08 VITALS — BP 138/70 | HR 64 | Temp 97.8°F | Resp 16 | Wt 130.0 lb

## 2017-03-08 DIAGNOSIS — M545 Low back pain: Secondary | ICD-10-CM | POA: Diagnosis not present

## 2017-03-08 DIAGNOSIS — N390 Urinary tract infection, site not specified: Secondary | ICD-10-CM

## 2017-03-08 LAB — POCT URINALYSIS DIPSTICK
BILIRUBIN UA: NEGATIVE
GLUCOSE UA: NEGATIVE
KETONES UA: NEGATIVE
Nitrite, UA: NEGATIVE
Protein, UA: NEGATIVE
SPEC GRAV UA: 1.01 (ref 1.010–1.025)
Urobilinogen, UA: 0.2 E.U./dL
pH, UA: 7 (ref 5.0–8.0)

## 2017-03-08 MED ORDER — CIPROFLOXACIN HCL 500 MG PO TABS: 500.0000 mg | ORAL_TABLET | Freq: Two times a day (BID) | ORAL | 0 refills | Status: AC

## 2017-03-08 MED ORDER — TRAMADOL HCL 50 MG PO TABS: 50.0000 mg | ORAL_TABLET | Freq: Three times a day (TID) | ORAL | 0 refills | Status: DC | PRN

## 2017-03-08 NOTE — Patient Instructions (Signed)
   Call to get order for Xray of lower back back if pain is not much better within a week.

## 2017-03-08 NOTE — Progress Notes (Signed)
Patient: Maria Bush Female    DOB: 02/06/1930   83 y.o.   MRN: [id number] Visit Date: 03/08/2017  Today's Provider: Mila Merry, MD   Chief Complaint  Patient presents with  . Back Pain  . Flank Pain   Subjective:    Patient stated that her left side and left side low back. Patient stated that she has no urine symptoms. Pain started when she laid down in bed last night. Patient took ibuprofen last night for pain with mild relief.    Back Pain  This is a new problem. The current episode started yesterday. The problem occurs constantly. The quality of the pain is described as cramping and shooting. The pain radiates to the left thigh. The pain is at a severity of 5/10. The pain is moderate. The pain is the same all the time. Associated symptoms include abdominal pain. Pertinent negatives include no bladder incontinence, bowel incontinence, chest pain, dysuria, fever, headaches, leg pain, numbness, paresis, paresthesias, pelvic pain, perianal numbness, tingling or weakness. She has tried NSAIDs for the symptoms. The treatment provided mild relief.       Allergies  Allergen Reactions  . Cephalexin Shortness Of Breath  . Prednisone Nausea And Vomiting  . Sulfa Antibiotics Itching  . Penicillins Rash     Current Outpatient Prescriptions:  .  amLODipine (NORVASC) 2.5 MG tablet, TAKE ONE (1) TABLET EACH DAY *REPLACES  DOSE, Disp: 90 tablet, Rfl: 1 .  aspirin 81 MG tablet, Take 81 mg by mouth daily., Disp: , Rfl:  .  atenolol (TENORMIN) 25 MG tablet, TAKE ONE (1) TABLET EACH DAY, Disp: 90 tablet, Rfl: 3 .  Calcium Carb-Cholecalciferol (CALCIUM 600 + D PO), Take by mouth., Disp: , Rfl:  .  fluticasone (FLONASE) 50 MCG/ACT nasal spray, Place into the nose., Disp: , Rfl:  .  furosemide (LASIX) 20 MG tablet, TAKE ONE (1) TABLET EACH DAY, Disp: 90 tablet, Rfl: 1 .  lisinopril (PRINIVIL,ZESTRIL) 20 MG tablet, TAKE 1 TABLET BY MOUTH ONCE A DAY - MUST MAKE APPOINTMENT WITH  DOCTOR, Disp: 90 tablet, Rfl: 3 .  loratadine (CLARITIN) 10 MG tablet, Take by mouth., Disp: , Rfl:  .  meloxicam (MOBIC) 15 MG tablet, TAKE ONE (1) TABLET EACH DAY, Disp: 30 tablet, Rfl: 11 .  Multiple Minerals-Vitamins (CALCIUM & VIT D3 BONE HEALTH PO), Take 1 tablet by mouth daily., Disp: , Rfl:  .  omeprazole (PRILOSEC) 40 MG capsule, TAKE ONE (1) CAPSULE EACH DAY, Disp: 30 capsule, Rfl: 12 .  potassium chloride (K-DUR) 10 MEQ tablet, TAKE ONE (1) TABLET EACH DAY, Disp: 90 tablet, Rfl: 4 .  Travoprost, BAK Free, (TRAVATAN Z) 0.004 % SOLN ophthalmic solution, Apply to eye., Disp: , Rfl:   Review of Systems  Constitutional: Negative for appetite change, chills, fatigue and fever.  Respiratory: Negative for chest tightness and shortness of breath.   Cardiovascular: Negative for chest pain and palpitations.  Gastrointestinal: Positive for abdominal pain. Negative for bowel incontinence, nausea and vomiting.  Genitourinary: Negative for bladder incontinence, dysuria and pelvic pain.  Musculoskeletal: Positive for back pain.  Neurological: Negative for dizziness, tingling, weakness, numbness, headaches and paresthesias.    Social History  Substance Use Topics  . Smoking status: Never Smoker  . Smokeless tobacco: Never Used  . Alcohol use No   Objective:   BP 138/70 (BP Location: Right Arm, Patient Position: Sitting, Cuff Size: Normal)   Pulse 64   Temp 97.8 F (36.6 C) (  Oral)   Resp 16   Wt 130 lb (59 kg)   SpO2 98%   BMI 25.39 kg/m  Vitals:   03/08/17 1102  BP: 138/70  Pulse: 64  Resp: 16  Temp: 97.8 F (36.6 C)  TempSrc: Oral  SpO2: 98%  Weight: 130 lb (59 kg)     Physical Exam  General Appearance:    Alert, cooperative, no distress  Eyes:    PERRL, conjunctiva/corneas clear, EOM's intact       Lungs:     Clear to auscultation bilaterally, respirations unlabored  Heart:    Regular rate and rhythm  Abdomen:   bowel sounds present and normal in all 4 quadrants, soft  or round. No CVA tenderness, CVA tenderness is present on the left     Results for orders placed or performed in visit on 03/08/17  POCT urinalysis dipstick  Result Value Ref Range   Color, UA Yellow    Clarity, UA Clear    Glucose, UA Neg    Bilirubin, UA Neg    Ketones, UA Neg    Spec Grav, UA 1.010 1.010 - 1.025   Blood, UA Trace    pH, UA 7.0 5.0 - 8.0   Protein, UA Neg    Urobilinogen, UA 0.2 0.2 or 1.0 E.U./dL   Nitrite, UA Neg    Leukocytes, UA Moderate (2+) (A) Negative       Assessment & Plan:     1. Urinary tract infection without hematuria, site unspecified  - CULTURE, URINE COMPREHENSIVE - ciprofloxacin (CIPRO) 500 MG tablet; Take 1 tablet (500 mg total) by mouth 2 (two) times daily.  Dispense: 14 tablet; Refill: 0  2. Acute left-sided low back pain, with sciatica presence unspecified  - POCT urinalysis dipstick - ciprofloxacin (CIPRO) 500 MG tablet; Take 1 tablet (500 mg total) by mouth 2 (two) times daily.  Dispense: 14 tablet; Refill: 0 - traMADol (ULTRAM) 50 MG tablet; Take 1 tablet (50 mg total) by mouth every 8 (eight) hours as needed.  Dispense: 30 tablet; Refill: 0     The entirety of the information documented in the History of Present Illness, Review of Systems and Physical Exam were personally obtained by me. Portions of this information were initially documented by April M. Hyacinth Meeker, CMA and reviewed by me for thoroughness and accuracy.    Mila Merry, MD  [facility]

## 2017-03-11 DIAGNOSIS — M1711 Unilateral primary osteoarthritis, right knee: Secondary | ICD-10-CM | POA: Diagnosis not present

## 2017-03-11 LAB — CULTURE, URINE COMPREHENSIVE

## 2017-03-18 ENCOUNTER — Other Ambulatory Visit: Payer: Self-pay | Admitting: Family Medicine

## 2017-03-18 DIAGNOSIS — I10 Essential (primary) hypertension: Secondary | ICD-10-CM

## 2017-03-25 ENCOUNTER — Encounter: Payer: Self-pay | Admitting: Emergency Medicine

## 2017-03-25 ENCOUNTER — Inpatient Hospital Stay
Admission: EM | Admit: 2017-03-25 | Discharge: 2017-03-26 | DRG: 378 | Disposition: A | Discharge status: Other Acute Inpatient Hospital | Payer: Medicare Other | Attending: Internal Medicine | Admitting: Internal Medicine

## 2017-03-25 DIAGNOSIS — Z791 Long term (current) use of non-steroidal anti-inflammatories (NSAID): Secondary | ICD-10-CM

## 2017-03-25 DIAGNOSIS — Z823 Family history of stroke: Secondary | ICD-10-CM

## 2017-03-25 DIAGNOSIS — Z880 Allergy status to penicillin: Secondary | ICD-10-CM

## 2017-03-25 DIAGNOSIS — K219 Gastro-esophageal reflux disease without esophagitis: Secondary | ICD-10-CM | POA: Diagnosis present

## 2017-03-25 DIAGNOSIS — K922 Gastrointestinal hemorrhage, unspecified: Principal | ICD-10-CM | POA: Diagnosis present

## 2017-03-25 DIAGNOSIS — Z803 Family history of malignant neoplasm of breast: Secondary | ICD-10-CM

## 2017-03-25 DIAGNOSIS — I10 Essential (primary) hypertension: Secondary | ICD-10-CM | POA: Diagnosis present

## 2017-03-25 DIAGNOSIS — Z888 Allergy status to other drugs, medicaments and biological substances status: Secondary | ICD-10-CM | POA: Diagnosis not present

## 2017-03-25 DIAGNOSIS — R001 Bradycardia, unspecified: Secondary | ICD-10-CM | POA: Diagnosis present

## 2017-03-25 DIAGNOSIS — Z8249 Family history of ischemic heart disease and other diseases of the circulatory system: Secondary | ICD-10-CM | POA: Diagnosis not present

## 2017-03-25 DIAGNOSIS — Z882 Allergy status to sulfonamides status: Secondary | ICD-10-CM | POA: Diagnosis not present

## 2017-03-25 DIAGNOSIS — M1990 Unspecified osteoarthritis, unspecified site: Secondary | ICD-10-CM | POA: Diagnosis present

## 2017-03-25 DIAGNOSIS — H409 Unspecified glaucoma: Secondary | ICD-10-CM | POA: Diagnosis present

## 2017-03-25 DIAGNOSIS — D62 Acute posthemorrhagic anemia: Secondary | ICD-10-CM | POA: Diagnosis present

## 2017-03-25 DIAGNOSIS — Z7982 Long term (current) use of aspirin: Secondary | ICD-10-CM

## 2017-03-25 DIAGNOSIS — Z881 Allergy status to other antibiotic agents status: Secondary | ICD-10-CM

## 2017-03-25 LAB — COMPREHENSIVE METABOLIC PANEL
ALT: 17 U/L (ref 14–54)
AST: 25 U/L (ref 15–41)
Albumin: 3.7 g/dL (ref 3.5–5.0)
Alkaline Phosphatase: 51 U/L (ref 38–126)
Anion gap: 7 (ref 5–15)
BILIRUBIN TOTAL: 1.1 mg/dL (ref 0.3–1.2)
BUN: 23 mg/dL — AB (ref 6–20)
CALCIUM: 9 mg/dL (ref 8.9–10.3)
CO2: 29 mmol/L (ref 22–32)
CREATININE: 0.83 mg/dL (ref 0.44–1.00)
Chloride: 102 mmol/L (ref 101–111)
GFR calc Af Amer: >60 mL/min (ref >60)
Glucose, Bld: 98 mg/dL (ref 65–99)
Potassium: 4.5 mmol/L (ref 3.5–5.1)
Sodium: 138 mmol/L (ref 135–145)
TOTAL PROTEIN: 7.2 g/dL (ref 6.5–8.1)

## 2017-03-25 LAB — CBC
HEMATOCRIT: 34.2 % — AB (ref 35.0–47.0)
HEMOGLOBIN: 11.4 g/dL — AB (ref 12.0–16.0)
MCH: 33.4 pg (ref 26.0–34.0)
MCHC: 33.2 g/dL (ref 32.0–36.0)
MCV: 100.8 fL — ABNORMAL HIGH (ref 80.0–100.0)
Platelets: 225 10*3/uL (ref 150–440)
RBC: 3.4 MIL/uL — ABNORMAL LOW (ref 3.80–5.20)
RDW: 13.1 % (ref 11.5–14.5)
WBC: 5.3 10*3/uL (ref 3.6–11.0)

## 2017-03-25 LAB — TYPE AND SCREEN
ABO/RH(D): A POS
ANTIBODY SCREEN: NEGATIVE

## 2017-03-25 LAB — HEMOGLOBIN: Hemoglobin: 11.4 g/dL — ABNORMAL LOW (ref 12.0–16.0)

## 2017-03-25 MED ORDER — LISINOPRIL 20 MG PO TABS
20.0000 mg | ORAL_TABLET | Freq: Every day | ORAL | Status: DC
  Filled 2017-03-25: qty 1

## 2017-03-25 MED ORDER — FLUTICASONE PROPIONATE 50 MCG/ACT NA SUSP
2.0000 | Freq: Every day | NASAL | Status: DC
Start: 2017-03-26 — End: 2017-03-26
  Administered 2017-03-26: 2 via NASAL
  Filled 2017-03-25: qty 16

## 2017-03-25 MED ORDER — SODIUM CHLORIDE 0.9 % IV BOLUS (SEPSIS)
1000.0000 mL | Freq: Once | INTRAVENOUS | Status: AC
Start: 2017-03-25 — End: 2017-03-25
  Administered 2017-03-25: 1000 mL via INTRAVENOUS

## 2017-03-25 MED ORDER — ATENOLOL 25 MG PO TABS
25.0000 mg | ORAL_TABLET | Freq: Every day | ORAL | Status: DC
  Filled 2017-03-25: qty 1

## 2017-03-25 MED ORDER — SODIUM CHLORIDE 0.9 % IV SOLN
INTRAVENOUS | Status: DC
  Administered 2017-03-25 – 2017-03-26 (×2): via INTRAVENOUS

## 2017-03-25 MED ORDER — ACETAMINOPHEN 325 MG PO TABS: 650.0000 mg | ORAL_TABLET | Freq: Four times a day (QID) | ORAL | Status: DC | PRN

## 2017-03-25 MED ORDER — LORATADINE 10 MG PO TABS: 10.0000 mg | ORAL_TABLET | Freq: Every day | ORAL | Status: DC | PRN

## 2017-03-25 MED ORDER — AMLODIPINE BESYLATE 5 MG PO TABS
2.5000 mg | ORAL_TABLET | Freq: Every day | ORAL | Status: DC
  Filled 2017-03-25: qty 1

## 2017-03-25 MED ORDER — ACETAMINOPHEN 650 MG RE SUPP: 650.0000 mg | Freq: Four times a day (QID) | RECTAL | Status: DC | PRN

## 2017-03-25 MED ORDER — PANTOPRAZOLE SODIUM 40 MG PO TBEC
40.0000 mg | DELAYED_RELEASE_TABLET | Freq: Every day | ORAL | Status: DC
  Administered 2017-03-26: 40 mg via ORAL
  Filled 2017-03-25: qty 1

## 2017-03-25 MED ORDER — ONDANSETRON HCL 4 MG/2ML IJ SOLN: 4.0000 mg | Freq: Four times a day (QID) | INTRAMUSCULAR | Status: DC | PRN

## 2017-03-25 MED ORDER — ONDANSETRON HCL 4 MG PO TABS: 4.0000 mg | ORAL_TABLET | Freq: Four times a day (QID) | ORAL | Status: DC | PRN

## 2017-03-25 NOTE — ED Notes (Signed)
Small amount of bright red blood seen on toilet paper in tissue. Patient reports larger amount passed earlier today.

## 2017-03-25 NOTE — Progress Notes (Signed)
Patient came up from ED with IV pulled out and bent.  Unable to palpate good vein.  IV team ordered for placement. Windy Carinaurner,Glenda Spelman K, RN 8:52 PM 03/25/2017

## 2017-03-25 NOTE — H&P (Signed)
[facility] - [place] [facility]   PATIENT NAME: Maria Bush    MR#:  [id number]  DATE OF BIRTH:  1930-03-07  DATE OF ADMISSION:  03/25/2017  PRIMARY CARE PHYSICIAN: Malva Limes, MD   REQUESTING/REFERRING PHYSICIAN:  Don Perking MD  CHIEF COMPLAINT:   Chief Complaint  Patient presents with  . Rectal Bleeding    HISTORY OF PRESENT ILLNESS: Maria Bush  is a 83 y.o. female with a known history of Osteoarthritis, heart murmur, hypertension, GERD and glaucoma who is presenting to the emergency room with complaint of having bright red blood blood per rectum. Patient had 2 episodes of this since this morning. Patient reports no previous history of this she denies any abdominal pain nausea or vomiting. PAST MEDICAL HISTORY:   Past Medical History:  Diagnosis Date  . Arthritis 2005  . Heart murmur 2005  . Hypertension     PAST SURGICAL HISTORY: Past Surgical History:  Procedure Laterality Date  . ABDOMINAL HYSTERECTOMY  1981  . BLADDER SURGERY  1992  . BREAST BIOPSY  1992  . CARDIAC CATHETERIZATION  2011  . CATARACT EXTRACTION Bilateral 2015  . CHOLECYSTECTOMY  08/09/2000  . COLONOSCOPY  1982  . DILATION AND CURETTAGE OF UTERUS  1979  . RECTOCELE REPAIR  1994    SOCIAL HISTORY:  Social History  Substance Use Topics  . Smoking status: Never Smoker  . Smokeless tobacco: Never Used  . Alcohol use No    FAMILY HISTORY:  Family History  Problem Relation Age of Onset  . Breast cancer Sister   . Early death Mother   . Stroke Father   . Heart disease Brother   . Heart attack Brother   . Heart disease Brother   . Heart attack Brother     DRUG ALLERGIES:  Allergies  Allergen Reactions  . Cephalexin Shortness Of Breath  . Prednisone Nausea And Vomiting  . Sulfa Antibiotics Itching  . Penicillins Rash    REVIEW OF SYSTEMS:   CONSTITUTIONAL: No fever, fatigue or weakness.  EYES: No blurred or double vision.  EARS, NOSE, AND THROAT: No  tinnitus or ear pain.  RESPIRATORY: No cough, shortness of breath, wheezing or hemoptysis.  CARDIOVASCULAR: No chest pain, orthopnea, edema.  GASTROINTESTINAL: No nausea, vomiting, diarrhea or abdominal pain. Positive bright red blood per rectum GENITOURINARY: No dysuria, hematuria.  ENDOCRINE: No polyuria, nocturia,  HEMATOLOGY: No anemia, easy bruising or bleeding SKIN: No rash or lesion. MUSCULOSKELETAL: No joint pain or arthritis.   NEUROLOGIC: No tingling, numbness, weakness.  PSYCHIATRY: No anxiety or depression.   MEDICATIONS AT HOME:  Prior to Admission medications   Medication Sig Start Date End Date Taking? Authorizing Provider  amLODipine (NORVASC) 2.5 MG tablet Take 1 tablet (2.5 mg total) by mouth daily. 03/18/17   Malva Limes, MD  aspirin 81 MG tablet Take 81 mg by mouth daily.    [provider]  atenolol (TENORMIN) 25 MG tablet TAKE ONE (1) TABLET EACH DAY 06/09/16   Malva Limes, MD  Calcium Carb-Cholecalciferol (CALCIUM 600 + D PO) Take by mouth.    [provider]  fluticasone (FLONASE) 50 MCG/ACT nasal spray Place into the nose. 01/23/13   [provider]  furosemide (LASIX) 20 MG tablet TAKE ONE (1) TABLET EACH DAY 10/27/16   Malva Limes, MD  lisinopril (PRINIVIL,ZESTRIL) 20 MG tablet TAKE 1 TABLET BY MOUTH ONCE A DAY - MUST MAKE APPOINTMENT WITH DOCTOR 10/26/16   Malva Limes,  MD  loratadine (CLARITIN) 10 MG tablet Take by mouth. 02/06/11   [provider]  meloxicam (MOBIC) 15 MG tablet TAKE ONE (1) TABLET EACH DAY 12/16/16   Malva Limes, MD  Multiple Minerals-Vitamins (CALCIUM & VIT D3 BONE HEALTH PO) Take 1 tablet by mouth daily.    [provider]  omeprazole (PRILOSEC) 40 MG capsule TAKE ONE (1) CAPSULE EACH DAY 01/23/17   Malva Limes, MD  potassium chloride (K-DUR) 10 MEQ tablet TAKE ONE (1) TABLET EACH DAY 12/16/16   Malva Limes, MD  traMADol (ULTRAM) 50 MG tablet Take 1 tablet (50 mg total)  by mouth every 8 (eight) hours as needed. 03/08/17   Malva Limes, MD  Travoprost, BAK Free, (TRAVATAN Z) 0.004 % SOLN ophthalmic solution Apply to eye.    [provider]      PHYSICAL EXAMINATION:   VITAL SIGNS: Blood pressure (!) 175/75, pulse 76, temperature 97.9 F (36.6 C), temperature source Oral, resp. rate 14, height 5\' 2"  (1.575 m), weight 135 lb (61.2 kg), SpO2 100 %.  GENERAL:  83 y.o.-year-old patient lying in the bed with no acute distress.  EYES: Pupils equal, round, reactive to light and accommodation. No scleral icterus. Extraocular muscles intact.  HEENT: Head atraumatic, normocephalic. Oropharynx and nasopharynx clear.  NECK:  Supple, no jugular venous distention. No thyroid enlargement, no tenderness.  LUNGS: Normal breath sounds bilaterally, no wheezing, rales,rhonchi or crepitation. No use of accessory muscles of respiration.  CARDIOVASCULAR: S1, S2 normal. No murmurs, rubs, or gallops.  ABDOMEN: Soft, nontender, nondistended. Bowel sounds present. No organomegaly or mass.  EXTREMITIES: No pedal edema, cyanosis, or clubbing.  NEUROLOGIC: Cranial nerves II through XII are intact. Muscle strength 5/5 in all extremities. Sensation intact. Gait not checked.  PSYCHIATRIC: The patient is alert and oriented x 3.  SKIN: No obvious rash, lesion, or ulcer.   LABORATORY PANEL:   CBC  Recent Labs Lab 03/25/17 1621  WBC 5.3  HGB 11.4*  HCT 34.2*  PLT 225  MCV 100.8*  MCH 33.4  MCHC 33.2  RDW 13.1   ------------------------------------------------------------------------------------------------------------------  Chemistries   Recent Labs Lab 03/25/17 1621  NA 138  K 4.5  CL 102  CO2 29  GLUCOSE 98  BUN 23*  CREATININE 0.83  CALCIUM 9.0  AST 25  ALT 17  ALKPHOS 51  BILITOT 1.1   ------------------------------------------------------------------------------------------------------------------ estimated creatinine clearance is 43.4 mL/min  (by C-G formula based on SCr of 0.83 mg/dL). ------------------------------------------------------------------------------------------------------------------ No results for input(s): TSH, T4TOTAL, T3FREE, THYROIDAB in the last 72 hours.  Invalid input(s): FREET3   Coagulation profile No results for input(s): INR, PROTIME in the last 168 hours. ------------------------------------------------------------------------------------------------------------------- No results for input(s): DDIMER in the last 72 hours. -------------------------------------------------------------------------------------------------------------------  Cardiac Enzymes No results for input(s): CKMB, TROPONINI, MYOGLOBIN in the last 168 hours.  Invalid input(s): CK ------------------------------------------------------------------------------------------------------------------ Invalid input(s): POCBNP  ---------------------------------------------------------------------------------------------------------------  Urinalysis    Component Value Date/Time   COLORURINE Straw 08/14/2014 2204   APPEARANCEUR Clear 08/14/2014 2204   LABSPEC 1.006 08/14/2014 2204   PHURINE 7.0 08/14/2014 2204   GLUCOSEU Negative 08/14/2014 2204   HGBUR Negative 08/14/2014 2204   BILIRUBINUR Neg 03/08/2017 1118   BILIRUBINUR Negative 08/14/2014 2204   KETONESUR Negative 08/14/2014 2204   PROTEINUR Neg 03/08/2017 1118   PROTEINUR Negative 08/14/2014 2204   UROBILINOGEN 0.2 03/08/2017 1118   NITRITE Neg 03/08/2017 1118   NITRITE Negative 08/14/2014 2204   LEUKOCYTESUR Moderate (2+) (A) 03/08/2017 1118   LEUKOCYTESUR  Negative 08/14/2014 2204     RADIOLOGY: No results found.  EKG: Orders placed or performed in visit on 12/04/13  . EKG 12-Lead    IMPRESSION AND PLAN: Patient is a 83 year old presenting with bright red blood per rectum  1. Lower GI bleed Suspect this is due to diverticular bleed Follow  hemoglobin Transfuse as needed I will put in a nursing miscellaneous order for a stat bleeding scan to localize the bleeding if she has recurrence of the bleeding if positive will need vascular surgery consult  2. Essential hypertension Continued therapy with atenolol and lisinopril I will hold amlodipine for today  3. GERD continue PPIs  4. Glaucoma continue eyedrops  5. misc: scd's for dvt proph     All the records are reviewed and case discussed with ED provider. Management plans discussed with the patient, family and they are in agreement.  CODE STATUS: Code Status History    This patient does not have a recorded code status. Please follow your organizational policy for patients in this situation.       TOTAL TIME TAKING CARE OF THIS PATIENT:55 minutes.    Auburn BilberryPATEL, Kemauri Musa M.D on 03/25/2017 at 5:31 PM  Between 7am to 6pm - Pager - [phone]  After 6pm go to [email or web address] - password [email or web address] [facility]  [facility]  Office  [phone]  CC: Primary care physician; Malva LimesFisher, Donald E, MD

## 2017-03-25 NOTE — Progress Notes (Signed)
Patient with large amount of bleeding with many clots; Nuclear Scan[facility]bleeding scan [facility] per Dr. Trenton FoundsShr. Patel's order. Windy Carinaurner,Amarius Toto K, RN 11:31 PM 03/25/2017

## 2017-03-25 NOTE — ED Provider Notes (Signed)
[facility] Emergency Department Provider Note  ____________________________________________  Time seen: Approximately 5:11 PM  I have reviewed the triage vital signs and the nursing notes.   HISTORY  Chief Complaint Rectal Bleeding   HPI Maria Bush is a 83 y.o. female with a history of diverticulosis and hypertension who presents for evaluation of rectal bleeding. Patient reports 2 episodes of large amount of rectal bleeding including blood clots since earlier this morning. No prior history of GI bleed. Patient is not on blood thinners. She reports that she has felt more fatigued and weak today but no chest pain, lightheadedness, or shortness of breath. Patient's last colonoscopy was 6 years ago according to her son and he was normal.No abdominal pain.   Past Medical History:  Diagnosis Date  . Arthritis 2005  . Heart murmur 2005  . Hypertension     Patient Active Problem List   Diagnosis Date Noted  . Left shoulder pain 12/26/2015  . Edema 04/25/2015  . Shortness of breath 04/25/2015  . Unsteady gait 04/25/2015  . Allergic rhinitis 04/24/2015  . Arthropathia 04/24/2015  . B12 deficiency 04/24/2015  . Atherosclerosis of coronary artery 04/24/2015  . DDD (degenerative disc disease), lumbar 04/24/2015  . Clinical depression 04/24/2015  . DD (diverticular disease) 04/24/2015  . Elevated CK 04/24/2015  . Atrial premature depolarization 04/24/2015  . Hypercholesteremia 04/24/2015  . BP (high blood pressure) 04/24/2015  . Anemia, iron deficiency 04/24/2015  . 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 Lower esophageal ring 07/12/2014  . 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 Urge incontinence of urine 09/01/2013  . Family history of malignant neoplasm of breast 02/10/2013  . Diffuse cystic mastopathy 02/10/2013  . Arthritis     Past Surgical History:  Procedure Laterality Date  . ABDOMINAL  HYSTERECTOMY  1981  . BLADDER SURGERY  1992  . BREAST BIOPSY  1992  . CARDIAC CATHETERIZATION  2011  . CATARACT EXTRACTION Bilateral 2015  . CHOLECYSTECTOMY  08/09/2000  . COLONOSCOPY  1982  . DILATION AND CURETTAGE OF UTERUS  1979  . RECTOCELE REPAIR  1994    Prior to Admission medications   Medication Sig Start Date End Date Taking? Authorizing Provider  amLODipine (NORVASC) 2.5 MG tablet Take 1 tablet (2.5 mg total) by mouth daily. 03/18/17   Malva Limes, MD  aspirin 81 MG tablet Take 81 mg by mouth daily.    [provider]  atenolol (TENORMIN) 25 MG tablet TAKE ONE (1) TABLET EACH DAY 06/09/16   Malva Limes, MD  Calcium Carb-Cholecalciferol (CALCIUM 600 + D PO) Take by mouth.    [provider]  fluticasone (FLONASE) 50 MCG/ACT nasal spray Place into the nose. 01/23/13   [provider]  furosemide (LASIX) 20 MG tablet TAKE ONE (1) TABLET EACH DAY 10/27/16   Malva Limes, MD  lisinopril (PRINIVIL,ZESTRIL) 20 MG tablet TAKE 1 TABLET BY MOUTH ONCE A DAY - MUST MAKE APPOINTMENT WITH DOCTOR 10/26/16   Malva Limes, MD  loratadine (CLARITIN) 10 MG tablet Take by mouth. 02/06/11   [provider]  meloxicam (MOBIC) 15 MG tablet TAKE ONE (1) TABLET EACH DAY 12/16/16   Malva Limes, MD  Multiple Minerals-Vitamins (CALCIUM & VIT D3 BONE HEALTH PO) Take 1 tablet by mouth daily.    [provider]  omeprazole (PRILOSEC) 40 MG capsule TAKE ONE (1) CAPSULE EACH DAY 01/23/17   Malva Limes, MD  potassium chloride (  K-DUR) 10 MEQ tablet TAKE ONE (1) TABLET EACH DAY 12/16/16   Malva LimesFisher, Donald E, MD  traMADol (ULTRAM) 50 MG tablet Take 1 tablet (50 mg total) by mouth every 8 (eight) hours as needed. 03/08/17   Malva LimesFisher, Donald E, MD  Travoprost, BAK Free, (TRAVATAN Z) 0.004 % SOLN ophthalmic solution Apply to eye.    [provider]    Allergies Cephalexin; Prednisone; Sulfa antibiotics; and Penicillins  Family History  Problem  Relation Age of Onset  . Breast cancer Sister   . Early death Mother   . Stroke Father   . Heart disease Brother   . Heart attack Brother   . Heart disease Brother   . Heart attack Brother     Social History Social History  Substance Use Topics  . Smoking status: Never Smoker  . Smokeless tobacco: Never Used  . Alcohol use No    Review of Systems  Constitutional: Negative for fever. Eyes: Negative for visual changes. ENT: Negative for sore throat. Neck: No neck pain  Cardiovascular: Negative for chest pain. Respiratory: Negative for shortness of breath. Gastrointestinal: Negative for abdominal pain, vomiting or diarrhea. Genitourinary: Negative for dysuria. + rectal bleeding  Musculoskeletal: Negative for back pain. Skin: Negative for rash. Neurological: Negative for headaches, weakness or numbness. Psych: No SI or HI  ____________________________________________   PHYSICAL EXAM:  VITAL SIGNS: ED Triage Vitals  Enc Vitals Group     BP 03/25/17 1620 (!) 175/75     Pulse Rate 03/25/17 1620 76     Resp 03/25/17 1620 14     Temp 03/25/17 1620 97.9 F (36.6 C)     Temp Source 03/25/17 1620 Oral     SpO2 03/25/17 1620 100 %     Weight 03/25/17 1621 135 lb (61.2 kg)     Height 03/25/17 1621 5\' 2"  (1.575 m)     Head Circumference --      Peak Flow --      Pain Score --      Pain Loc --      Pain Edu? --      Excl. in GC? --     Constitutional: Alert and oriented. Well appearing and in no apparent distress. HEENT:      Head: Normocephalic and atraumatic.         Eyes: Conjunctivae are normal. Sclera is non-icteric. EOMI. PERRL      Mouth/Throat: Mucous membranes are moist.       Neck: Supple with no signs of meningismus. Cardiovascular: Regular rate and rhythm. No murmurs, gallops, or rubs. 2+ symmetrical distal pulses are present in all extremities. No JVD. Respiratory: Normal respiratory effort. Lungs are clear to auscultation bilaterally. No wheezes,  crackles, or rhonchi.  Gastrointestinal: Soft, non tender, and non distended with positive bowel sounds. No rebound or guarding. Genitourinary: No CVA tenderness. Rectal exam showing grossly bloody stool guaiac positive. Musculoskeletal: Nontender with normal range of motion in all extremities. No edema, cyanosis, or erythema of extremities. Neurologic: Normal speech and language. Face is symmetric. Moving all extremities. No gross focal neurologic deficits are appreciated. Skin: Skin is warm, dry and intact. No rash noted. Psychiatric: Mood and affect are normal. Speech and behavior are normal.  ____________________________________________   LABS (all labs ordered are listed, but only abnormal results are displayed)  Labs Reviewed  COMPREHENSIVE METABOLIC PANEL - Abnormal; Notable for the following:       Result Value   BUN 23 (*)    All  other components within normal limits  CBC - Abnormal; Notable for the following:    RBC 3.40 (*)    Hemoglobin 11.4 (*)    HCT 34.2 (*)    MCV 100.8 (*)    All other components within normal limits  POC OCCULT BLOOD, ED  TYPE AND SCREEN   ____________________________________________  EKG  none  ____________________________________________  RADIOLOGY  none  ____________________________________________   PROCEDURES  Procedure(s) performed: None Procedures Critical Care performed:  None ____________________________________________   INITIAL IMPRESSION / ASSESSMENT AND PLAN / ED COURSE   83 y.o. female with a history of diverticulosis and hypertension who presents for evaluation of lower GIB since this am. Patient is hemodynamically stable, hemoglobin is at baseline, rectal exam showing grossly bloody stool guaiac positive. Possibly diverticular bleed. Discussed with the hospitalist will admit patient.     Pertinent labs & imaging results that were available during my care of the patient were reviewed by me and considered in my  medical decision making (see chart for details).    ____________________________________________   FINAL CLINICAL IMPRESSION(S) / ED DIAGNOSES  Final diagnoses:  Lower GI bleed      NEW MEDICATIONS STARTED DURING THIS VISIT:  New Prescriptions   No medications on file     Note:  This document was prepared using Dragon voice recognition software and may include unintentional dictation errors.    Don Perking, [place], MD 03/25/17 [phone]

## 2017-03-25 NOTE — ED Triage Notes (Signed)
Pt reports 2 episodes dark red rectal bleeding today with bowel movement. Denies pain. Denies any nausea or vomiting. Denies fevers. No blood thinners

## 2017-03-26 ENCOUNTER — Encounter: Payer: Self-pay | Admitting: Radiology

## 2017-03-26 ENCOUNTER — Encounter (HOSPITAL_COMMUNITY): Payer: Self-pay | Admitting: Internal Medicine

## 2017-03-26 ENCOUNTER — Inpatient Hospital Stay (HOSPITAL_COMMUNITY)
Admission: AD | Admit: 2017-03-26 | Discharge: 2017-03-29 | DRG: 378 | Disposition: A | Discharge status: Home Health | Payer: Medicare Other | Source: Other Acute Inpatient Hospital | Attending: Internal Medicine | Admitting: Internal Medicine

## 2017-03-26 ENCOUNTER — Inpatient Hospital Stay: Payer: Medicare Other

## 2017-03-26 DIAGNOSIS — K219 Gastro-esophageal reflux disease without esophagitis: Secondary | ICD-10-CM | POA: Diagnosis present

## 2017-03-26 DIAGNOSIS — R509 Fever, unspecified: Secondary | ICD-10-CM | POA: Diagnosis present

## 2017-03-26 DIAGNOSIS — R410 Disorientation, unspecified: Secondary | ICD-10-CM | POA: Diagnosis present

## 2017-03-26 DIAGNOSIS — I10 Essential (primary) hypertension: Secondary | ICD-10-CM | POA: Diagnosis present

## 2017-03-26 DIAGNOSIS — K5731 Diverticulosis of large intestine without perforation or abscess with bleeding: Principal | ICD-10-CM | POA: Diagnosis present

## 2017-03-26 DIAGNOSIS — M1990 Unspecified osteoarthritis, unspecified site: Secondary | ICD-10-CM | POA: Diagnosis not present

## 2017-03-26 DIAGNOSIS — K921 Melena: Secondary | ICD-10-CM | POA: Diagnosis not present

## 2017-03-26 DIAGNOSIS — Z880 Allergy status to penicillin: Secondary | ICD-10-CM | POA: Diagnosis not present

## 2017-03-26 DIAGNOSIS — Z8249 Family history of ischemic heart disease and other diseases of the circulatory system: Secondary | ICD-10-CM | POA: Diagnosis not present

## 2017-03-26 DIAGNOSIS — R001 Bradycardia, unspecified: Secondary | ICD-10-CM

## 2017-03-26 DIAGNOSIS — R011 Cardiac murmur, unspecified: Secondary | ICD-10-CM | POA: Diagnosis present

## 2017-03-26 DIAGNOSIS — Z882 Allergy status to sulfonamides status: Secondary | ICD-10-CM

## 2017-03-26 DIAGNOSIS — E538 Deficiency of other specified B group vitamins: Secondary | ICD-10-CM | POA: Diagnosis present

## 2017-03-26 DIAGNOSIS — K922 Gastrointestinal hemorrhage, unspecified: Secondary | ICD-10-CM | POA: Diagnosis not present

## 2017-03-26 DIAGNOSIS — Z823 Family history of stroke: Secondary | ICD-10-CM

## 2017-03-26 DIAGNOSIS — H409 Unspecified glaucoma: Secondary | ICD-10-CM | POA: Diagnosis present

## 2017-03-26 DIAGNOSIS — D62 Acute posthemorrhagic anemia: Secondary | ICD-10-CM | POA: Diagnosis not present

## 2017-03-26 DIAGNOSIS — Z79899 Other long term (current) drug therapy: Secondary | ICD-10-CM

## 2017-03-26 DIAGNOSIS — Z803 Family history of malignant neoplasm of breast: Secondary | ICD-10-CM

## 2017-03-26 DIAGNOSIS — K5790 Diverticulosis of intestine, part unspecified, without perforation or abscess without bleeding: Secondary | ICD-10-CM | POA: Diagnosis present

## 2017-03-26 HISTORY — DX: Acute posthemorrhagic anemia: D62

## 2017-03-26 HISTORY — DX: Diverticulosis of intestine, part unspecified, without perforation or abscess without bleeding: K57.90

## 2017-03-26 HISTORY — DX: Gastrointestinal hemorrhage, unspecified: K92.2

## 2017-03-26 LAB — CBC
HCT: 26.2 % — ABNORMAL LOW (ref 35.0–47.0)
HCT: 28 % — ABNORMAL LOW (ref 36.0–46.0)
HCT: 25.2 % — ABNORMAL LOW (ref 36.0–46.0)
HEMOGLOBIN: 9 g/dL — AB (ref 12.0–16.0)
HEMOGLOBIN: 9 g/dL — AB (ref 12.0–15.0)
Hemoglobin: 8.2 g/dL — ABNORMAL LOW (ref 12.0–15.0)
MCH: 34.6 pg — ABNORMAL HIGH (ref 26.0–34.0)
MCH: 32.1 pg (ref 26.0–34.0)
MCH: 32.5 pg (ref 26.0–34.0)
MCHC: 34.4 g/dL (ref 32.0–36.0)
MCHC: 32.1 g/dL (ref 30.0–36.0)
MCHC: 32.5 g/dL (ref 30.0–36.0)
MCV: 100.5 fL — ABNORMAL HIGH (ref 80.0–100.0)
MCV: 100 fL (ref 78.0–100.0)
MCV: 100 fL (ref 78.0–100.0)
PLATELETS: 180 10*3/uL (ref 150–440)
PLATELETS: 180 10*3/uL (ref 150–400)
Platelets: 197 10*3/uL (ref 150–400)
RBC: 2.61 MIL/uL — AB (ref 3.80–5.20)
RBC: 2.8 MIL/uL — ABNORMAL LOW (ref 3.87–5.11)
RBC: 2.52 MIL/uL — ABNORMAL LOW (ref 3.87–5.11)
RDW: 12.9 % (ref 11.5–14.5)
RDW: 12.9 % (ref 11.5–15.5)
RDW: 12.7 % (ref 11.5–15.5)
WBC: 4.2 10*3/uL (ref 3.6–11.0)
WBC: 5.4 10*3/uL (ref 4.0–10.5)
WBC: 5.4 10*3/uL (ref 4.0–10.5)

## 2017-03-26 LAB — PROTIME-INR
INR: 1.14
Prothrombin Time: 14.7 seconds (ref 11.4–15.2)

## 2017-03-26 LAB — APTT: APTT: 29 s (ref 24–36)

## 2017-03-26 LAB — PHOSPHORUS: PHOSPHORUS: 3.1 mg/dL (ref 2.5–4.6)

## 2017-03-26 LAB — BASIC METABOLIC PANEL
Anion gap: 4 — ABNORMAL LOW (ref 5–15)
BUN: 18 mg/dL (ref 6–20)
CHLORIDE: 110 mmol/L (ref 101–111)
CO2: 25 mmol/L (ref 22–32)
Calcium: 8 mg/dL — ABNORMAL LOW (ref 8.9–10.3)
Creatinine, Ser: 0.63 mg/dL (ref 0.44–1.00)
Glucose, Bld: 86 mg/dL (ref 65–99)
POTASSIUM: 3.9 mmol/L (ref 3.5–5.1)
SODIUM: 139 mmol/L (ref 135–145)

## 2017-03-26 LAB — MAGNESIUM: MAGNESIUM: 1.8 mg/dL (ref 1.7–2.4)

## 2017-03-26 LAB — HEMOGLOBIN
HEMOGLOBIN: 8.5 g/dL — AB (ref 12.0–16.0)
Hemoglobin: 9.8 g/dL — ABNORMAL LOW (ref 12.0–16.0)

## 2017-03-26 MED ORDER — ACETAMINOPHEN 325 MG PO TABS: 650.0000 mg | ORAL_TABLET | Freq: Four times a day (QID) | ORAL | Status: DC | PRN

## 2017-03-26 MED ORDER — ONDANSETRON HCL 4 MG/2ML IJ SOLN: 4.0000 mg | Freq: Four times a day (QID) | INTRAMUSCULAR | Status: DC | PRN

## 2017-03-26 MED ORDER — SODIUM CHLORIDE 0.9 % IV BOLUS (SEPSIS)
500.0000 mL | Freq: Once | INTRAVENOUS | Status: AC
  Administered 2017-03-26: 500 mL via INTRAVENOUS

## 2017-03-26 MED ORDER — FLUTICASONE PROPIONATE 50 MCG/ACT NA SUSP
1.0000 | Freq: Every day | NASAL | Status: DC | PRN
  Filled 2017-03-26: qty 16

## 2017-03-26 MED ORDER — ONDANSETRON HCL 4 MG PO TABS: 4.0000 mg | ORAL_TABLET | Freq: Four times a day (QID) | ORAL | Status: DC | PRN

## 2017-03-26 MED ORDER — SODIUM CHLORIDE 0.9 % IV SOLN
INTRAVENOUS | Status: DC
  Administered 2017-03-26: 17:00:00 via INTRAVENOUS

## 2017-03-26 MED ORDER — ACETAMINOPHEN 650 MG RE SUPP: 650.0000 mg | Freq: Four times a day (QID) | RECTAL | Status: DC | PRN

## 2017-03-26 MED ORDER — SODIUM CHLORIDE 0.9% FLUSH
3.0000 mL | Freq: Two times a day (BID) | INTRAVENOUS | Status: DC
  Administered 2017-03-27 – 2017-03-29 (×5): 3 mL via INTRAVENOUS

## 2017-03-26 MED ORDER — LATANOPROST 0.005 % OP SOLN
1.0000 [drp] | Freq: Every day | OPHTHALMIC | Status: DC
Start: 2017-03-26 — End: 2017-03-29
  Administered 2017-03-27 – 2017-03-28 (×2): 1 [drp] via OPHTHALMIC
  Filled 2017-03-26: qty 2.5

## 2017-03-26 MED ORDER — TECHNETIUM TC 99M-LABELED RED BLOOD CELLS IV KIT
22.0000 | PACK | Freq: Once | INTRAVENOUS | Status: AC | PRN
  Administered 2017-03-26: 23.21 via INTRAVENOUS

## 2017-03-26 NOTE — Progress Notes (Signed)
Patient discharged via [facility] to [place].  Sister of patient was here at the time of transport

## 2017-03-26 NOTE — Progress Notes (Signed)
[place] LodgeMargaret L Bush is a 83 y.o. female patient [facility] awake, alert - oriented  X 3-4 - no acute distress noted.  VSS - Blood pressure (!) 149/62, pulse 80, temperature 97.4 F (36.3 C), temperature source Oral, resp. rate 16.    IV in place, occlusive dsg intact without redness.  Orientation to room, and floor completed with information packet given to patient/family.  Patient declined safety video at this time.  Admission INP armband ID verified with patient/family, and in place.   SR up x 2, fall assessment complete, with patient and family able to verbalize understanding of risk associated with falls, and verbalized understanding to call nsg before up out of bed.  Call light within reach, patient able to voice, and demonstrate understanding.  Skin, clean-dry- intact without evidence of bruising, or skin tears.   No evidence of skin break down noted on exam.     Will cont to eval and treat per MD orders.  Evern BioLoren D Eulogia Dismore, RN 03/26/2017 3:21 PM

## 2017-03-26 NOTE — Progress Notes (Signed)
BP low.  MD Winona LegatoVaickute notified and order given to hold am BP/cardiac meds

## 2017-03-26 NOTE — Progress Notes (Signed)
Report called to [facility] at [facility].  [facility] is on the way to get the patient for transport

## 2017-03-26 NOTE — Progress Notes (Signed)
0250: Left for Radiology with Orderly via bed; Centralized telemetry called to let them know, patient would be off the floor for scan; Windy Carinaurner,Bertha Earwood K, RN 3:25 AM 03/26/2017

## 2017-03-26 NOTE — Discharge Summary (Signed)
[facility] [place] at [facility]   PATIENT NAME: Maria Bush    MR#:  [id number]  DATE OF BIRTH:  Aug 11, 1930  DATE OF ADMISSION:  03/25/2017 ADMITTING PHYSICIAN: Auburn Bilberry, MD  DATE OF DISCHARGE: No discharge date for patient encounter.  PRIMARY CARE PHYSICIAN: Malva Limes, MD     ADMISSION DIAGNOSIS:  Lower GI bleed [K92.2]  DISCHARGE DIAGNOSIS:  Active Problems:   GI bleed   Acute post-hemorrhagic anemia   Bradycardia   SECONDARY DIAGNOSIS:   Past Medical History:  Diagnosis Date  . Arthritis 2005  . Heart murmur 2005  . Hypertension     .pro HOSPITAL COURSE:   The patient is a 83 year old African and female with past medical history significant for arthritis, heart murmur, hypertension, who presented to the hospital with rectal bleed with bright red blood. She had at least 2 episodes at home. She had no abdominal pain, no diarrhea. She was admitted to our hospital, initiated on IV fluids, Protonix, kept nothing by mouth. She had 2 more episodes of gastrointestinal bleed, which was significant, with clots. She underwent tagged red blood cell scan 03/26/2017, revealing acute gastrointestinal bleeding in mid abdomen, likely small bowel. Vascular surgery consultation was requested, however, they felt that area. Bleeding is not amenable to vascular procedure read. I discussed patient's case with general surgeon, who recommended to transfer patient to tertiary care center with gastroenterologist input is available. Patient's vital signs remained stable on IV fluid administration. Her hemoglobin level decreased from 11.4 on the day of admission to 9.0 in the morning of May 11. Patient will be likely transferred to [facility] for further care. Discussion by problem: #1. Acute posthemorrhagic anemia, hemoglobin level dropping down from 11.4 on the day of admission to 9.0. At this time. Continue follow hemoglobin level every 6  hours and transfuse patient as needed. Aspirin is discontinued #2. Gastrointestinal bleed, concerning for bleed from small bowel, discussed with vascular surgery and general surgeon, also gastroenterologist who is not on call, recommended to transfer to [facility] for further care. Aspirin is discontinued. #3. Bradycardia, suspend beta blocker #4. Arthritis, discontinue Mobic #5. Essential hypertension, continue all outpatient medications, but beta blocker due to bradycardia DISCHARGE CONDITIONS:   Stable  CONSULTS OBTAINED:  Treatment Team:  Tressia Danas, MD  DRUG ALLERGIES:   Allergies  Allergen Reactions  . Cephalexin Shortness Of Breath  . Prednisone Nausea And Vomiting  . Sulfa Antibiotics Itching  . Penicillins Rash    DISCHARGE MEDICATIONS:   Current Discharge Medication List    CONTINUE these medications which have NOT CHANGED   Details  amLODipine (NORVASC) 2.5 MG tablet Take 1 tablet (2.5 mg total) by mouth daily. Qty: 90 tablet, Refills: 1   Associated Diagnoses: Essential hypertension    Calcium Carb-Cholecalciferol (CALCIUM 600 + D PO) Take by mouth.    fluticasone (FLONASE) 50 MCG/ACT nasal spray Place 1 spray into both nostrils daily as needed.     lisinopril (PRINIVIL,ZESTRIL) 20 MG tablet TAKE 1 TABLET BY MOUTH ONCE A DAY - MUST MAKE APPOINTMENT WITH DOCTOR Qty: 90 tablet, Refills: 3   Associated Diagnoses: Essential hypertension    loratadine (CLARITIN) 10 MG tablet Take 10 mg by mouth daily as needed.     Multiple Minerals-Vitamins (CALCIUM & VIT D3 BONE HEALTH PO) Take 1 tablet by mouth daily.    omeprazole (PRILOSEC) 40 MG capsule TAKE ONE (1) CAPSULE EACH DAY Qty: 30 capsule, Refills:  12    traMADol (ULTRAM) 50 MG tablet Take 1 tablet (50 mg total) by mouth every 8 (eight) hours as needed. Qty: 30 tablet, Refills: 0   Associated Diagnoses: Acute left-sided low back pain, with sciatica presence unspecified    Travoprost, BAK  Free, (TRAVATAN Z) 0.004 % SOLN ophthalmic solution Place 1 drop into both eyes at bedtime.       STOP taking these medications     aspirin 81 MG tablet      atenolol (TENORMIN) 25 MG tablet      furosemide (LASIX) 20 MG tablet      meloxicam (MOBIC) 15 MG tablet      potassium chloride (K-DUR) 10 MEQ tablet          DISCHARGE INSTRUCTIONS:    The patient is to follow-up with her primary care physician as outpatient. Whenever she is out from the hospital  If you experience worsening of your admission symptoms, develop shortness of breath, life threatening emergency, suicidal or homicidal thoughts you must seek medical attention immediately by calling 911 or calling your MD immediately  if symptoms less severe.  You Must read complete instructions/literature along with all the possible adverse reactions/side effects for all the Medicines you take and that have been prescribed to you. Take any new Medicines after you have completely understood and accept all the possible adverse reactions/side effects.   Please note  You were cared for by a hospitalist during your hospital stay. If you have any questions about your discharge medications or the care you received while you were in the hospital after you are discharged, you can call the unit and asked to speak with the hospitalist on call if the hospitalist that took care of you is not available. Once you are discharged, your primary care physician will handle any further medical issues. Please note that NO REFILLS for any discharge medications will be authorized once you are discharged, as it is imperative that you return to your primary care physician (or establish a relationship with a primary care physician if you do not have one) for your aftercare needs so that they can reassess your need for medications and monitor your lab values.    Today   CHIEF COMPLAINT:   Chief Complaint  Patient presents with  . Rectal Bleeding     HISTORY OF PRESENT ILLNESS:     VITAL SIGNS:  Blood pressure (!) 134/59, pulse 61, temperature 98.1 F (36.7 C), temperature source Oral, resp. rate 16, height 5\' 2"  (1.575 m), weight 61.2 kg (135 lb), SpO2 98 %.  I/O:   Intake/Output Summary (Last 24 hours) at 03/26/17 0942 Last data filed at 03/26/17 0915  Gross per 24 hour  Intake              940 ml  Output              900 ml  Net               40 ml    PHYSICAL EXAMINATION:  GENERAL:  83 y.o.-year-old patient lying in the bed with no acute distress. Dry oral mucosa EYES: Pupils equal, round, reactive to light and accommodation. No scleral icterus. Extraocular muscles intact.  HEENT: Head atraumatic, normocephalic. Oropharynx and nasopharynx clear.  NECK:  Supple, no jugular venous distention. No thyroid enlargement, no tenderness.  LUNGS: Normal breath sounds bilaterally, no wheezing, rales,rhonchi or crepitation. No use of accessory muscles of respiration.  CARDIOVASCULAR: S1, S2 normal.  No murmurs, rubs, or gallops.  ABDOMEN: Soft, non-tender, non-distended. Bowel sounds present. No organomegaly or mass.  EXTREMITIES: No pedal edema, cyanosis, or clubbing.  NEUROLOGIC: Cranial nerves II through XII are intact. Muscle strength 5/5 in all extremities. Sensation intact. Gait not checked.  PSYCHIATRIC: The patient is alert and oriented x 3.  SKIN: No obvious rash, lesion, or ulcer.   DATA REVIEW:   CBC  Recent Labs Lab 03/26/17 0554  WBC 4.2  HGB 9.0*  HCT 26.2*  PLT 180    Chemistries   Recent Labs Lab 03/25/17 1621 03/26/17 0554  NA 138 139  K 4.5 3.9  CL 102 110  CO2 29 25  GLUCOSE 98 86  BUN 23* 18  CREATININE 0.83 0.63  CALCIUM 9.0 8.0*  AST 25  --   ALT 17  --   ALKPHOS 51  --   BILITOT 1.1  --     Cardiac Enzymes No results for input(s): TROPONINI in the last 168 hours.  Microbiology Results  Results for orders placed or performed in visit on 03/08/17  CULTURE, URINE COMPREHENSIVE      Status: None   Collection Time: 03/08/17 11:19 AM  Result Value Ref Range Status   Urine Culture, Comprehensive Final report  Final   Result 1 Comment  Final    Comment: No growth in 36 - 48 hours.    RADIOLOGY:  Nm Gi Blood Loss  Result Date: 03/26/2017 CLINICAL DATA:  Lower GI bleed. Active bleeding currently. Fatigue and weakness. EXAM: NUCLEAR MEDICINE GASTROINTESTINAL BLEEDING SCAN TECHNIQUE: Sequential abdominal images were obtained following intravenous administration of Tc-5765m labeled red blood cells. RADIOPHARMACEUTICALS:  23.21 mCi Tc-4765m in-vitro labeled red cells. COMPARISON:  CT abdomen and pelvis 08/15/2014 FINDINGS: Radiotracer accumulation is demonstrated centrally in the abdomen consistent with an acute gastrointestinal bleed, likely in the mid abdominal small bowel. Normal blood pool activity. IMPRESSION: Acute gastrointestinal bleeding, likely in the mid abdominal small bowel. These results were called by telephone at the time of interpretation on 03/26/2017 at 5:52 am to [facility]RN Marcella, who verbally acknowledged these results. Electronically Signed   By: Burman NievesWilliam  Stevens M.D.   On: 03/26/2017 05:55    EKG:   Orders placed or performed in visit on 12/04/13  . EKG 12-Lead      Management plans discussed with the patient, family and they are in agreement.  CODE STATUS:     Code Status Orders        Start     Ordered   03/25/17 1857  Full code  Continuous     03/25/17 1856    Code Status History    Date Active Date Inactive Code Status Order ID Comments User Context   This patient has a current code status but no historical code status.    Advance Directive Documentation     Most Recent Value  Type of Advance Directive  -- [son says that patient does not have a POA]  Pre-existing out of facility DNR order (yellow form or pink MOST form)  -  "MOST" Form in Place?  -      TOTAL TIME TAKING CARE OF THIS PATIENT: 40 minutes.    Katharina CaperVAICKUTE,Marsha Hillman M.D on  03/26/2017 at 9:42 AM  Between 7am to 6pm - Pager - [phone]  After 6pm go to [email or web address] - password [email or web address]  [facility]  Office  [phone]  CC: Primary care physician; Malva LimesFisher, Donald E, MD

## 2017-03-26 NOTE — Progress Notes (Signed)
Patient returned to room; Dr. Andria MeuseStevens called this RN and reported that he did see bleeding..."in the mid abdominal area of the small bowel"; Centralized telemetry called to notify them that patient had returned to Room 213; lab called to draw Hgb.  Patient sleeping at this time. Windy Carinaurner,Jerrye Seebeck K, RN 6:09 AM 03/26/2017

## 2017-03-26 NOTE — Consult Note (Signed)
Referring Provider:   Dr. Eliezer Bottom Primary Care Physician:  Malva Limes, MD Primary Gastroenterologist:  None (unassigned)  Reason for Consultation:  GI bleed  HPI: Maria Bush is a 83 y.o. female transferred from [facility].  She developed painless hematochezia yesterday morning, and presented to the hospital there in [place]. Since then, it sounds as though she has had some additional hematochezia, although details are unclear and the patient herself is an unreliable historian, indicating that she had actually been having bleeding for several days (whereas, her daughter with whom I spoke on the phone, and who lives with the patient, indicated that the bleeding clearly began yesterday).  The patient has no prior history of significant GI bleeding, and is not on any blood thinners or ulcerogenic medications.   Her hemoglobin has drifted down from 11.4 yesterday evening to a current level of 9.0, but she has not required a transfusion and has been hemodynamically stable.  A nuclear medicine bleeding scan obtained this morning at [facility] showed active bleeding in the mid abdominal region. It was suspected this might be of small bowel origin, but on review of the study over the telephone with our radiologist here, it is noted that this patient, who has known diverticulosis, also has colonic redundancy with the sigmoid colon up in the region of the mid abdomen, making it at least possible that the bleeding is of colonic origin.     Past Medical History:  Diagnosis Date  . Acute blood loss anemia   . Arthritis 2005  . Diverticulosis   . GI bleed   . Heart murmur 2005  . Hypertension     Past Surgical History:  Procedure Laterality Date  . ABDOMINAL HYSTERECTOMY  1981  . BLADDER SURGERY  1992  . BREAST BIOPSY  1992  . CARDIAC CATHETERIZATION  2011  . CATARACT EXTRACTION Bilateral 2015  . CHOLECYSTECTOMY  08/09/2000  . COLONOSCOPY  1982  .  DILATION AND CURETTAGE OF UTERUS  1979  . RECTOCELE REPAIR  1994    Prior to Admission medications   Medication Sig Start Date End Date Taking? Authorizing Provider  amLODipine (NORVASC) 2.5 MG tablet Take 1 tablet (2.5 mg total) by mouth daily. 03/18/17   Malva Limes, MD  Calcium Carb-Cholecalciferol (CALCIUM 600 + D PO) Take by mouth.    [provider]  fluticasone (FLONASE) 50 MCG/ACT nasal spray Place 1 spray into both nostrils daily as needed.  01/23/13   [provider]  lisinopril (PRINIVIL,ZESTRIL) 20 MG tablet TAKE 1 TABLET BY MOUTH ONCE A DAY - MUST MAKE APPOINTMENT WITH DOCTOR Patient taking differently: TAKE 1 TABLET BY MOUTH ONCE A DAY 10/26/16   Malva Limes, MD  loratadine (CLARITIN) 10 MG tablet Take 10 mg by mouth daily as needed.  02/06/11   [provider]  Multiple Minerals-Vitamins (CALCIUM & VIT D3 BONE HEALTH PO) Take 1 tablet by mouth daily.    [provider]  omeprazole (PRILOSEC) 40 MG capsule TAKE ONE (1) CAPSULE EACH DAY 01/23/17   Malva Limes, MD  traMADol (ULTRAM) 50 MG tablet Take 1 tablet (50 mg total) by mouth every 8 (eight) hours as needed. 03/08/17   Malva Limes, MD  Travoprost, BAK Free, (TRAVATAN Z) 0.004 % SOLN ophthalmic solution Place 1 drop into both eyes at bedtime.     [provider]    Current Facility-Administered Medications  Medication Dose Route Frequency Provider Last Rate Last Dose  .  0.9 %  sodium chloride infusion   Intravenous Continuous Black, Lesle Chris, NP      . acetaminophen (TYLENOL) tablet 650 mg  650 mg Oral Q6H PRN Gwenyth Bender, NP       Or  . acetaminophen (TYLENOL) suppository 650 mg  650 mg Rectal Q6H PRN Black, Lesle Chris, NP      . fluticasone (FLONASE) 50 MCG/ACT nasal spray 1 spray  1 spray Each Nare Daily PRN Black, Lesle Chris, NP      . latanoprost (XALATAN) 0.005 % ophthalmic solution 1 drop  1 drop Both Eyes QHS Black, Karen M, NP      . ondansetron [facility]) tablet  4 mg  4 mg Oral Q6H PRN Gwenyth Bender, NP       Or  . ondansetron [facility]) injection 4 mg  4 mg Intravenous Q6H PRN Black, Karen M, NP      . sodium chloride flush (NS) 0.9 % injection 3 mL  3 mL Intravenous Q12H Gwenyth Bender, NP        Allergies as of 03/26/2017 - Review Complete 03/26/2017  Allergen Reaction Noted  . Cephalexin Shortness Of Breath 04/24/2015  . Prednisone Nausea And Vomiting 02/09/2013  . Sulfa antibiotics Itching 02/09/2013  . Penicillins Rash 12/24/2012    Family History  Problem Relation Age of Onset  . Breast cancer Sister   . Early death Mother   . Stroke Father   . Heart disease Brother   . Heart attack Brother   . Heart disease Brother   . Heart attack Brother     Social History   Social History  . Marital status: Widowed    Spouse name: N/A  . Number of children: 2  . Years of education: HS   Occupational History  . Retired     Education officer, environmental   Social History Main Topics  . Smoking status: Never Smoker  . Smokeless tobacco: Never Used  . Alcohol use No  . Drug use: No  . Sexual activity: Not on file   Other Topics Concern  . Not on file   Social History Narrative   Pt lives in [place]. Her daughter lives with her, with 3 grandchildren 69, 59, 30. Her daughter is a Research scientist (medical). Pt takes care of her grandchildren at home.     Review of Systems: Negative for current acid reflux symptoms, anorexia, weight loss, chest pain, or exertional dyspnea. No urinary symptoms such as hematuria. The patient wonders if some of the blood could be coming from the vagina, in that there has been blood present anteriorly when she wipes. No skin problems. She reports lower extremity swelling last year, not a problem this year as she has cut back on her salt intake. Her activity is limited because of arthritis, especially affecting her knees.  Physical Exam: Vital signs in last 24 hours: Temp:  [97.4 F (36.3 C)-98.5 F (36.9 C)] 97.4 F (36.3 C)  (05/11 1441) Pulse Rate:  [47-93] 80 (05/11 1441) Resp:  [12-18] 16 (05/11 1441) BP: (101-175)/(47-83) 149/62 (05/11 1441) SpO2:  [98 %-100 %] 100 % (05/11 1223) Weight:  [61.2 kg (135 lb)] 61.2 kg (135 lb) (05/10 1621)   General:   Alert,  Well-developed, well-nourished, pleasant and cooperative lying in bed in NAD Head:  Normocephalic and atraumatic. Eyes:  Sclera clear, no icterus.   Conjunctiva slightly pale. Mouth:   No ulcerations or lesions.  Oropharynx pink & moist. Neck:   No  masses or thyromegaly. Lungs:  Clear throughout to auscultation. No evident respiratory distress. Heart:  Irregular rhythm, suggestive of ventricular bigeminy. Abdomen:  Soft, nontender, and nondistended. No masses, hepatosplenomegaly or ventral hernias noted. Normal bowel sounds, without bruits, guarding, or rebound.   Rectal:  No masses, capacious rectal ampulla, copious liquid maroon/burgundy blood present   Msk:   Symmetrical without gross deformities. Pulses:  Normal radial pulse is noted. Extremities:   Without clubbing, cyanosis, or edema. Neurologic:  Alert and coherent;  grossly normal neurologically. Possibly some memory issues, see history of present illness. Skin:  Intact without significant lesions or rashes. Cervical Nodes:  No significant cervical adenopathy. Psych:   Alert and cooperative. Normal mood and affect.  Intake/Output from previous day: No intake/output data recorded. Intake/Output this shift: No intake/output data recorded.  Lab Results:  Recent Labs  03/25/17 1621  03/26/17 0554 03/26/17 1127 03/26/17 1525  WBC 5.3  --  4.2  --  5.4  HGB 11.4*  < > 9.0* 8.5* 9.0*  HCT 34.2*  --  26.2*  --  28.0*  PLT 225  --  180  --  197  < > = values in this interval not displayed. BMET  Recent Labs  03/25/17 1621 03/26/17 0554  NA 138 139  K 4.5 3.9  CL 102 110  CO2 29 25  GLUCOSE 98 86  BUN 23* 18  CREATININE 0.83 0.63  CALCIUM 9.0 8.0*   LFT  Recent Labs   03/25/17 1621  PROT 7.2  ALBUMIN 3.7  AST 25  ALT 17  ALKPHOS 51  BILITOT 1.1   PT/INR  Recent Labs  03/26/17 1525  LABPROT 14.7  INR 1.14    Studies/Results: Nm Gi Blood Loss  Result Date: 03/26/2017 CLINICAL DATA:  Lower GI bleed. Active bleeding currently. Fatigue and weakness. EXAM: NUCLEAR MEDICINE GASTROINTESTINAL BLEEDING SCAN TECHNIQUE: Sequential abdominal images were obtained following intravenous administration of Tc-744m labeled red blood cells. RADIOPHARMACEUTICALS:  23.21 mCi Tc-2844m in-vitro labeled red cells. COMPARISON:  CT abdomen and pelvis 08/15/2014 FINDINGS: Radiotracer accumulation is demonstrated centrally in the abdomen consistent with an acute gastrointestinal bleed, likely in the mid abdominal small bowel. Normal blood pool activity. IMPRESSION: Acute gastrointestinal bleeding, likely in the mid abdominal small bowel. These results were called by telephone at the time of interpretation on 03/26/2017 at 5:52 am to [facility], who verbally acknowledged these results. Electronically Signed   By: Burman NievesWilliam  Stevens M.D.   On: 03/26/2017 05:55    Impression: 1. Recurrent painless hematochezia without hemodynamic instability 2. Posthemorrhagic anemia, moderate, stable 3. Colonic diverticulosis by CT scan  Discussion: I think the overall picture is most compatible with colonic diverticular hemorrhage. In favor of this is the relatively stable hemoglobin and absence of hemodynamic instability, despite passage of fairly copious amounts of liquid blood (which would suggest a more distal origin, such as the sigmoid colon, which is also compatible with the patient's nuclear medicine bleeding scan). Furthermore, the patient is not on ulcerogenic medications which would cause a small bowel bleed, and her BUN has not been significantly elevated to suggest an upper tract source.  Plan: 1. Capsule endoscopy tomorrow to help confirm the presence or absence (probably absence) of a  small bowel source of the patient's bleeding. Ashby Dawesature, purpose, risks reviewed with patient and she is agreeable. 2. Continue frequent blood monitoring and supportive care. 3. At present, I do not feel that there is a role for upper endoscopy (almost certainly not an  upper tract source) or colonoscopy (is typically not very helpful for lower GI bleeding, especially when the patient has a known history of diverticulosis).     LOS: 0 days   Krystianna Soth V  03/26/2017, 4:09 PM   Pager [phone] If no answer or after 5 PM call [phone]

## 2017-03-26 NOTE — Consult Note (Signed)
Due to the location of GI bleed (small intestine) - vascular surgery is unable to intervene endovascularly.  Discussed with Dr. Gilda CreaseSchnier.  Please re-consult if needed.

## 2017-03-26 NOTE — Progress Notes (Signed)
Order received from Dr Winona LegatoVaickute to [facility] the [facility]

## 2017-03-26 NOTE — H&P (Signed)
History and Physical    Maria Bush [id number] DOB: 04-03-1930 DOA: 03/26/2017  PCP: Malva LimesFisher, Donald E, MD Patient coming from: [place]  Chief Complaint: rectal bleeding  HPI: Maria LodgeMargaret L Bush is a 83 y.o. female with medical history significant for hypertension, GERD, glaucoma, osteoarthritis, heart murmur presents to room 11 on 5west at [facility] from [facility] complaint acute blood loss anemia likely related to GI bleed.  Information is obtained from the patient. She reports to 4 day history of bright red blood per rectum. She also reports she thinks "it's coming from the front". She denies headache visual disturbances dizziness syncope or near-syncope. She denies shortness of breath chest pain palpitations lower extremity edema. She denies abdominal pain nausea vomiting diarrhea. She denies dysuria hematuria frequency or urgency. She denies fever chills recent travel or sick contacts  ED Course: At [facility] patient had tagged red blood cell scan revealing small bowel bleeding. Chart review interventional radiology at the facility felt they could not intervene on this area. Discussed with general surgery who recommended transfer to a facility with GI coverage  Review of Systems: As per HPI otherwise all other systems reviewed and are negative.   Ambulatory Status: Ambulates independently lives at home with her daughter and grandchildren recent falls  Past Medical History:  Diagnosis Date  . Acute blood loss anemia   . Arthritis 2005  . Diverticulosis   . GI bleed   . Heart murmur 2005  . Hypertension     Past Surgical History:  Procedure Laterality Date  . ABDOMINAL HYSTERECTOMY  1981  . BLADDER SURGERY  1992  . BREAST BIOPSY  1992  . CARDIAC CATHETERIZATION  2011  . CATARACT EXTRACTION Bilateral 2015  . CHOLECYSTECTOMY  08/09/2000  . COLONOSCOPY  1982  . DILATION AND CURETTAGE OF UTERUS  1979  . RECTOCELE REPAIR  1994    Social History   Social  History  . Marital status: Widowed    Spouse name: N/A  . Number of children: 2  . Years of education: HS   Occupational History  . Retired     Education officer, environmentalMill Worker   Social History Main Topics  . Smoking status: Never Smoker  . Smokeless tobacco: Never Used  . Alcohol use No  . Drug use: No  . Sexual activity: Not on file   Other Topics Concern  . Not on file   Social History Narrative   [place] in [place]. Her daughter lives with her, with 3 grandchildren [id number], [id number], [id number]. Her daughter is a Research scientist (medical)lab technician. Pt takes care of her grandchildren at home.     Allergies  Allergen Reactions  . Cephalexin Shortness Of Breath  . Prednisone Nausea And Vomiting  . Sulfa Antibiotics Itching  . Penicillins Rash    Family History  Problem Relation Age of Onset  . Breast cancer Sister   . Early death Mother   . Stroke Father   . Heart disease Brother   . Heart attack Brother   . Heart disease Brother   . Heart attack Brother     Prior to Admission medications   Medication Sig Start Date End Date Taking? Authorizing Provider  amLODipine (NORVASC) 2.5 MG tablet Take 1 tablet (2.5 mg total) by mouth daily. 03/18/17   Malva LimesFisher, Donald E, MD  Calcium Carb-Cholecalciferol (CALCIUM 600 + D PO) Take by mouth.    [provider]  fluticasone (FLONASE) 50 MCG/ACT nasal spray Place 1 spray into both nostrils daily as  needed.  01/23/13   [provider]  lisinopril (PRINIVIL,ZESTRIL) 20 MG tablet TAKE 1 TABLET BY MOUTH ONCE A DAY - MUST MAKE APPOINTMENT WITH DOCTOR Patient taking differently: TAKE 1 TABLET BY MOUTH ONCE A DAY 10/26/16   Malva Limes, MD  loratadine (CLARITIN) 10 MG tablet Take 10 mg by mouth daily as needed.  02/06/11   [provider]  Multiple Minerals-Vitamins (CALCIUM & VIT D3 BONE HEALTH PO) Take 1 tablet by mouth daily.    [provider]  omeprazole (PRILOSEC) 40 MG capsule TAKE ONE (1) CAPSULE EACH DAY 01/23/17   Malva Limes, MD  traMADol  (ULTRAM) 50 MG tablet Take 1 tablet (50 mg total) by mouth every 8 (eight) hours as needed. 03/08/17   Malva Limes, MD  Travoprost, BAK Free, (TRAVATAN Z) 0.004 % SOLN ophthalmic solution Place 1 drop into both eyes at bedtime.     [provider]    Physical Exam: Vitals:   03/26/17 1441  BP: (!) 149/62  Pulse: 80  Resp: 16  Temp: 97.4 F (36.3 C)  TempSrc: Oral     General:  Appears calm and comfortable, no acute distress Eyes:  PERRL, EOMI, normal lids, iris ENT:  grossly normal hearing, lips & tongue, mucous membranes membranes of his mouth are pink and moist Neck:  no LAD, masses or thyromegaly Cardiovascular:  RRR, no m/r/g. No LE edema. Pedal pulses present and palpable Respiratory:  CTA bilaterally, no w/r/r. Normal respiratory effort. Abdomen:  soft, ntnd, positive bowel sounds no guarding or rebounding Skin:  no rash or induration seen on limited exam Musculoskeletal:  grossly normal tone BUE/BLE, good ROM, no bony abnormality Psychiatric:  grossly normal mood and affect, speech fluent and appropriate, AOx3 Neurologic:  CN 2-12 grossly intact, moves all extremities in coordinated fashion, sensation intact  Labs on Admission: I have personally reviewed following labs and imaging studies  CBC:  Recent Labs Lab 03/25/17 1621 03/25/17 1757 03/25/17 2353 03/26/17 0554 03/26/17 1127  WBC 5.3  --   --  4.2  --   HGB 11.4* 11.4* 9.8* 9.0* 8.5*  HCT 34.2*  --   --  26.2*  --   MCV 100.8*  --   --  100.5*  --   PLT 225  --   --  180  --    Basic Metabolic Panel:  Recent Labs Lab 03/25/17 1621 03/26/17 0554  NA 138 139  K 4.5 3.9  CL 102 110  CO2 29 25  GLUCOSE 98 86  BUN 23* 18  CREATININE 0.83 0.63  CALCIUM 9.0 8.0*   GFR: Estimated Creatinine Clearance: 45 mL/min (by C-G formula based on SCr of 0.63 mg/dL). Liver Function Tests:  Recent Labs Lab 03/25/17 1621  AST 25  ALT 17  ALKPHOS 51  BILITOT 1.1  PROT 7.2  ALBUMIN 3.7   No  results for input(s): LIPASE, AMYLASE in the last 168 hours. No results for input(s): AMMONIA in the last 168 hours. Coagulation Profile: No results for input(s): INR, PROTIME in the last 168 hours. Cardiac Enzymes: No results for input(s): CKTOTAL, CKMB, CKMBINDEX, TROPONINI in the last 168 hours. BNP (last 3 results) No results for input(s): PROBNP in the last 8760 hours. HbA1C: No results for input(s): HGBA1C in the last 72 hours. CBG: No results for input(s): GLUCAP in the last 168 hours. Lipid Profile: No results for input(s): CHOL, HDL, LDLCALC, TRIG, CHOLHDL, LDLDIRECT in the last 72 hours. Thyroid Function  Tests: No results for input(s): TSH, T4TOTAL, FREET4, T3FREE, THYROIDAB in the last 72 hours. Anemia Panel: No results for input(s): VITAMINB12, FOLATE, FERRITIN, TIBC, IRON, RETICCTPCT in the last 72 hours. Urine analysis:    Component Value Date/Time   COLORURINE Straw 08/14/2014 2204   APPEARANCEUR Clear 08/14/2014 2204   LABSPEC 1.006 08/14/2014 2204   PHURINE 7.0 08/14/2014 2204   GLUCOSEU Negative 08/14/2014 2204   HGBUR Negative 08/14/2014 2204   BILIRUBINUR Neg 03/08/2017 1118   BILIRUBINUR Negative 08/14/2014 2204   KETONESUR Negative 08/14/2014 2204   PROTEINUR Neg 03/08/2017 1118   PROTEINUR Negative 08/14/2014 2204   UROBILINOGEN 0.2 03/08/2017 1118   NITRITE Neg 03/08/2017 1118   NITRITE Negative 08/14/2014 2204   LEUKOCYTESUR Moderate (2+) (A) 03/08/2017 1118   LEUKOCYTESUR Negative 08/14/2014 2204    Creatinine Clearance: Estimated Creatinine Clearance: 45 mL/min (by C-G formula based on SCr of 0.63 mg/dL).  Sepsis Labs: @LABRCNTIP (procalcitonin:4,lacticidven:4) )No results found for this or any previous visit (from the past 240 hour(s)).   Radiological Exams on Admission: Nm Gi Blood Loss  Result Date: 03/26/2017 CLINICAL DATA:  Lower GI bleed. Active bleeding currently. Fatigue and weakness. EXAM: NUCLEAR MEDICINE GASTROINTESTINAL BLEEDING  SCAN TECHNIQUE: Sequential abdominal images were obtained following intravenous administration of Tc-38m labeled red blood cells. RADIOPHARMACEUTICALS:  23.21 mCi Tc-25m in-vitro labeled red cells. COMPARISON:  CT abdomen and pelvis 08/15/2014 FINDINGS: Radiotracer accumulation is demonstrated centrally in the abdomen consistent with an acute gastrointestinal bleed, likely in the mid abdominal small bowel. Normal blood pool activity. IMPRESSION: Acute gastrointestinal bleeding, likely in the mid abdominal small bowel. These results were called by telephone at the time of interpretation on 03/26/2017 at 5:52 am to [facility], who verbally acknowledged these results. Electronically Signed   By: Burman Nieves M.D.   On: 03/26/2017 05:55    EKG: pending on admission  Assessment/Plan Principal Problem:   Acute blood loss anemia Active Problems:   B12 deficiency   BP (high blood pressure)   GI bleed   Bradycardia   Diverticulosis   #1. Acute blood loss anemia likely related to GI bleed in small bowel per tagged scan. She does have a history of iron deficiency anemia. It appears her baseline hemoglobin is around 11. Globin 8.5 on admission. She's had 2 episodes of bright red blood per rectum. As noted above patient had tagged red blood cell scan at [facility] that showed small bowel bleeding. Hx diverticulosis  -Admit to telemetry -Serial CBCs -Gentle IV fluids -Vital signs every 4 hours -SCDs -Notify gastroenterology of patient's arrival  #2. GI bleed. No history of same. Per chart review patient had tagged red blood cell scan at [facility] that showed small bowel bleeding. Evaluated by interventional radiology had [place] who felt they could not intervene. Recommended she transfer to a facility where GI available -Nothing by mouth until evaluated by gastroenterology -serial CBC -consider transfusion if Hg 8.0 or less.  -See #1  #3. Bradycardia. Reportedly bradycardia during night -monitor on  telemetry -check TSH -EKG  4. Hypertension. Fair control on admission. Home medications include amlodipine and lisinopril. -We'll hold these for now given #1 -Monitor closely -resume home meds when indicated   DVT prophylaxis: scd Code Status: full Family Communication: none present  Disposition Plan: home  Consults called: hayes with GI per [place] MD  Admission status: inpateint    Gwenyth Bender MD [facility]  If 7PM-7AM, please contact night-coverage [email or web address] Password [facility]  03/26/2017, 3:32 PM

## 2017-03-26 NOTE — Progress Notes (Signed)
Radiology called to get ETA on on-call Radiologist and tech for bleeding scan ordered stat; stated they were unsure, Nuclear med called with no answer. [facility] recalled for ETA [phone]; spoke with "Ms. Pines" at the on-call service who transferred me to Dr. Andria MeuseStevens: "...the tech has been called in.Marland Kitchen.Marland Kitchen.I'll call them to make sure they are coming to [place]tde..."  Windy Carinaurner,Ainhoa Rallo K, RN 12:41 AM 03/26/2017

## 2017-03-26 NOTE — Progress Notes (Signed)
Called Nuclear med to get estimated ETA for patient to return to room; stated that patient would be 45 more minutes, on a 2 hour test after rec'ing IV contrast.  Expect patient to return around 0600. Centralized telemetry notified of this also. Windy Carinaurner,Twyla Dais K, RN 5:26 AM 03/26/2017

## 2017-03-26 NOTE — Progress Notes (Signed)
On-call tech, Hattie Perchudley Rose, contacted after several calls between [facility] and this RN as to ascertain if someone had been called in;  She stated that she is coming in, will QC the equipment and then call when she is ready for the patient; patient is still actively bleeding. Windy Carinaurner,Lakhia Gengler K, RN 1:30 AM 03/26/2017

## 2017-03-26 NOTE — Plan of Care (Signed)
Problem: Bowel/Gastric: Goal: Will not experience complications related to bowel motility Outcome: Not Progressing Actively bleeding; bleeding scan in progress.

## 2017-03-27 ENCOUNTER — Encounter (HOSPITAL_COMMUNITY)
Admission: AD | Disposition: A | Discharge status: Home Health | Payer: Self-pay | Source: Other Acute Inpatient Hospital | Attending: Internal Medicine

## 2017-03-27 DIAGNOSIS — D62 Acute posthemorrhagic anemia: Secondary | ICD-10-CM

## 2017-03-27 HISTORY — PX: GIVENS CAPSULE STUDY: SHX5432

## 2017-03-27 LAB — CBC
HCT: 23.9 % — ABNORMAL LOW (ref 36.0–46.0)
HEMATOCRIT: 25.2 % — AB (ref 36.0–46.0)
Hemoglobin: 8.1 g/dL — ABNORMAL LOW (ref 12.0–15.0)
Hemoglobin: 7.8 g/dL — ABNORMAL LOW (ref 12.0–15.0)
MCH: 32 pg (ref 26.0–34.0)
MCH: 32.5 pg (ref 26.0–34.0)
MCHC: 32.1 g/dL (ref 30.0–36.0)
MCHC: 32.6 g/dL (ref 30.0–36.0)
MCV: 99.6 fL (ref 78.0–100.0)
MCV: 99.6 fL (ref 78.0–100.0)
PLATELETS: 176 10*3/uL (ref 150–400)
Platelets: 185 10*3/uL (ref 150–400)
RBC: 2.53 MIL/uL — ABNORMAL LOW (ref 3.87–5.11)
RBC: 2.4 MIL/uL — ABNORMAL LOW (ref 3.87–5.11)
RDW: 12.6 % (ref 11.5–15.5)
RDW: 12.6 % (ref 11.5–15.5)
WBC: 5.2 10*3/uL (ref 4.0–10.5)
WBC: 5 10*3/uL (ref 4.0–10.5)

## 2017-03-27 LAB — COMPREHENSIVE METABOLIC PANEL
ALK PHOS: 39 U/L (ref 38–126)
ALT: 14 U/L (ref 14–54)
AST: 21 U/L (ref 15–41)
Albumin: 2.8 g/dL — ABNORMAL LOW (ref 3.5–5.0)
Anion gap: 6 (ref 5–15)
BUN: 13 mg/dL (ref 6–20)
CHLORIDE: 110 mmol/L (ref 101–111)
CO2: 24 mmol/L (ref 22–32)
CREATININE: 0.69 mg/dL (ref 0.44–1.00)
Calcium: 8.2 mg/dL — ABNORMAL LOW (ref 8.9–10.3)
GFR calc Af Amer: >60 mL/min (ref >60)
Glucose, Bld: 90 mg/dL (ref 65–99)
Potassium: 3.6 mmol/L (ref 3.5–5.1)
Sodium: 140 mmol/L (ref 135–145)
Total Bilirubin: 1.1 mg/dL (ref 0.3–1.2)
Total Protein: 5.4 g/dL — ABNORMAL LOW (ref 6.5–8.1)

## 2017-03-27 LAB — PREPARE RBC (CROSSMATCH)

## 2017-03-27 LAB — ABO/RH: ABO/RH(D): A POS

## 2017-03-27 SURGERY — IMAGING PROCEDURE, GI TRACT, INTRALUMINAL, VIA CAPSULE

## 2017-03-27 MED ORDER — AMLODIPINE BESYLATE 2.5 MG PO TABS
2.5000 mg | ORAL_TABLET | Freq: Every day | ORAL | Status: DC
  Administered 2017-03-27 – 2017-03-29 (×3): 2.5 mg via ORAL
  Filled 2017-03-27 (×3): qty 1

## 2017-03-27 MED ORDER — SODIUM CHLORIDE 0.9 % IV SOLN: Freq: Once | INTRAVENOUS | Status: DC

## 2017-03-27 SURGICAL SUPPLY — 1 items: TOWEL COTTON PACK 4EA (MISCELLANEOUS) ×6 IMPLANT

## 2017-03-27 NOTE — Progress Notes (Signed)
[facility] Progress Note  Patient: Maria Bush ZOX:[id number]   PCP: Malva Limes, MD DOB: 23-Apr-1930   DOA: 03/26/2017   DOS: 03/27/2017   Date of Service: the patient was seen and examined on 03/27/2017  Subjective: Feeling about the same. No abdominal pain no fever no chills. Did have a bowel movement yesterday as well as this morning with some blood which she is describing as mixed. No nausea no vomiting. Last night had increased temperature but no reoccurrence.  Brief hospital course: Pt. with PMH of GERD, HTN, osteoarthritis; admitted on 03/26/2017, presented with complaint of fatigue and dizziness, was found to have GI bleed. Currently further plan is identified and the location of the GI bleed.  Assessment and Plan: 1. Lower GI bleed. Patient has bright red blood per rectum, still has some bleeding. Nuclear medicine scan was positive for active bleeding although location was debated between the small bowel or large bowel. Vascular surgery was consulted at [facility] and recommended they would not be able to intervene to stop the bleeding. Patient was sent here for further workup, currently the plan is to confirm the location of bleeding with capsule endoscopy which is performed today. We'll monitor H&H every 12 hours. Hemoglobin at baseline is around 11, carotid drop to 7.8, will transfuse 1 unit PRBC.  2. Essential hypertension. Bradycardia. Continue to monitor on telemetry. Blood pressure relatively stable, continuing home amlodipine, holding lisinopril.  Diet: clear liquid DVT Prophylaxis: mechanical compression device  Advance goals of care discussion: full code  Family Communication: no family was present at bedside, at the time of interview.  Disposition:  Discharge to home.  Consultants: gastroenterology Procedures: capsule endoscopy  Antibiotics: Anti-infectives    None       Objective: Physical Exam: Vitals:   03/27/17 0617 03/27/17 0913 03/27/17  1144 03/27/17 1505  BP: (!) 172/73  118/62 (!) 136/55  Pulse: (!) 55  87 82  Resp: 20   18  Temp: 99.1 F (37.3 C)  98.3 F (36.8 C) 98.3 F (36.8 C)  TempSrc:   Oral Oral  SpO2: 99%   100%  Weight:  61.2 kg (135 lb)    Height:  5\' 2"  (1.575 m)      Intake/Output Summary (Last 24 hours) at 03/27/17 1618 Last data filed at 03/27/17 1503  Gross per 24 hour  Intake                0 ml  Output             2050 ml  Net            -2050 ml   Filed Weights   03/27/17 0913  Weight: 61.2 kg (135 lb)   General: Alert, Awake and Oriented to Time, Place and Person. Appear in mild distress, affect appropriate Eyes: PERRL, Conjunctiva normal ENT: Oral Mucosa clear moist. Neck: no JVD, no Abnormal Mass Or lumps Cardiovascular: S1 and S2 Present, no Murmur, Respiratory: Bilateral Air entry equal and Decreased, no use of accessory muscle, Clear to Auscultation, no Crackles, no wheezes Abdomen: Bowel Sound present, Soft and no tenderness Skin: no redness, no Rash, no induration Extremities: no Pedal edema, no calf tenderness Neurologic: Grossly no focal neuro deficit. Bilaterally Equal motor strength  Data Reviewed: CBC:  Recent Labs Lab 03/26/17 0554 03/26/17 1127 03/26/17 1525 03/26/17 1950 03/27/17 0215 03/27/17 0816  WBC 4.2  --  5.4 5.4 5.2 5.0  HGB 9.0* 8.5* 9.0* 8.2* 8.1* 7.8*  HCT 26.2*  --  28.0* 25.2* 25.2* 23.9*  MCV 100.5*  --  100.0 100.0 99.6 99.6  PLT 180  --  197 180 185 176   Basic Metabolic Panel:  Recent Labs Lab 03/25/17 1621 03/26/17 0554 03/26/17 1525 03/27/17 0215  NA 138 139  --  140  K 4.5 3.9  --  3.6  CL 102 110  --  110  CO2 29 25  --  24  GLUCOSE 98 86  --  90  BUN 23* 18  --  13  CREATININE 0.83 0.63  --  0.69  CALCIUM 9.0 8.0*  --  8.2*  MG  --   --  1.8  --   PHOS  --   --  3.1  --     Liver Function Tests:  Recent Labs Lab 03/25/17 1621 03/27/17 0215  AST 25 21  ALT 17 14  ALKPHOS 51 39  BILITOT 1.1 1.1  PROT 7.2 5.4*    ALBUMIN 3.7 2.8*   No results for input(s): LIPASE, AMYLASE in the last 168 hours. No results for input(s): AMMONIA in the last 168 hours. Coagulation Profile:  Recent Labs Lab 03/26/17 1525  INR 1.14   Cardiac Enzymes: No results for input(s): CKTOTAL, CKMB, CKMBINDEX, TROPONINI in the last 168 hours. BNP (last 3 results) No results for input(s): PROBNP in the last 8760 hours. CBG: No results for input(s): GLUCAP in the last 168 hours. Studies: No results found.  Scheduled Meds: . amLODipine  2.5 mg Oral Daily  . latanoprost  1 drop Both Eyes QHS  . sodium chloride flush  3 mL Intravenous Q12H   Continuous Infusions: . sodium chloride     PRN Meds: acetaminophen **OR** acetaminophen, fluticasone, ondansetron **OR** ondansetron (ZOFRAN) IV  Time spent: 35 minutes  Author: Lynden OxfordPranav Laverne Hursey, MD [facility] Pager: [phone] 03/27/2017 4:18 PM  If 7PM-7AM, please contact night-coverage at [email or web address], password [email or web address]

## 2017-03-27 NOTE — Progress Notes (Signed)
DR Toniann FailKakrakandy was paged about Pt pulse being  low after the blood transfusion is done, put in an order for EKG, pt  PULSE 76 after rechecked in less than an hour MD was paged again,  gave an oral order to cancel  The EKG order since PULSE is fine, I will continue to monitor the pt

## 2017-03-27 NOTE — Progress Notes (Addendum)
[facility] Progress Note  Maria LodgeMargaret L Bush 83 y.o. 04/04/1930  CC:  GI bleed   Subjective: Patient had a small amount of blood last night and no bowel movement this morning. Had a fever of 102.5 last night. Denied any nausea, vomiting, abdominal pain.  ROS : Positive for fever. Negative for chest pain and shortness of breath.   Objective: Vital signs in last 24 hours: Vitals:   03/26/17 2359 03/27/17 0617  BP:  (!) 172/73  Pulse:  (!) 55  Resp:  20  Temp: (!) 102.5 F (39.2 C) 99.1 F (37.3 C)    Physical Exam:  General:  Alert, cooperative, no distress, appears stated age  Head:  Normocephalic, without obvious abnormality, atraumatic  Eyes:  , EOM's intact,   Lungs:   Clear to auscultation bilaterally, respirations unlabored  Heart:  Regular rate and rhythm, Murmur present   Abdomen:   Soft, non-tender, bowel sounds active all four quadrants,  no masses,   Extremities: Extremities normal, atraumatic, no  edema       Lab Results:  Recent Labs  03/26/17 0554 03/26/17 1525 03/27/17 0215  NA 139  --  140  K 3.9  --  3.6  CL 110  --  110  CO2 25  --  24  GLUCOSE 86  --  90  BUN 18  --  13  CREATININE 0.63  --  0.69  CALCIUM 8.0*  --  8.2*  MG  --  1.8  --   PHOS  --  3.1  --     Recent Labs  03/25/17 1621 03/27/17 0215  AST 25 21  ALT 17 14  ALKPHOS 51 39  BILITOT 1.1 1.1  PROT 7.2 5.4*  ALBUMIN 3.7 2.8*    Recent Labs  03/26/17 1950 03/27/17 0215  WBC 5.4 5.2  HGB 8.2* 8.1*  HCT 25.2* 25.2*  MCV 100.0 99.6  PLT 180 185    Recent Labs  03/26/17 1525  LABPROT 14.7  INR 1.14      Assessment/Plan: - Painless hematochezia. GI bleed scan positive for bleeding in the mid abdomen. Possibly small bowel. - Acute blood loss anemia. Hemoglobin stable at this point. - Fever. Etiology unknown. Tmax 102.5 last night.  Recommendations ------------------------ - Although bleeding scan showed possible bleeding from small intestine ,  given her redundancy of the colon , colonic source such as diverticular bleed cannot be ruled out. Patient is scheduled to have a capsule endoscopy today. Patient with no previous small bowel surgery. Denied any nausea or vomiting. Risk of capsule retention discussed with the patient and the family member. They verbalized understanding. Further plan based on capsule endoscopy finding. Monitor hemoglobin. Transfuse as needed. GI will follow  Kathi DerParag Sheniqua Carolan MD, FACP 03/27/2017, 8:27 AM  Pager [phone]  If no answer or after 5 PM call [phone]

## 2017-03-27 NOTE — Progress Notes (Signed)
Rn called phlebotomy twice about patient's type and screen that had not been collected yet that was ordered around 1300. They stated both times that they would send someone up.

## 2017-03-27 NOTE — Progress Notes (Signed)
Patient ingested givens capsule for small bowel endoscopy 03/27/2017 at 0915. Patient is to remain NPO for 2 hours following ingestion. Written instructions for diet and equipment given to patient and primary RN and reviewed. Patient must wear equipment for 12 hours and can remove 03/27/2017 at 2115.

## 2017-03-28 ENCOUNTER — Encounter (HOSPITAL_COMMUNITY): Payer: Self-pay

## 2017-03-28 LAB — CBC
HCT: 27.9 % — ABNORMAL LOW (ref 36.0–46.0)
HEMATOCRIT: 29.5 % — AB (ref 36.0–46.0)
Hemoglobin: 9.6 g/dL — ABNORMAL LOW (ref 12.0–15.0)
Hemoglobin: 10.2 g/dL — ABNORMAL LOW (ref 12.0–15.0)
MCH: 33.6 pg (ref 26.0–34.0)
MCH: 33.3 pg (ref 26.0–34.0)
MCHC: 34.4 g/dL (ref 30.0–36.0)
MCHC: 34.6 g/dL (ref 30.0–36.0)
MCV: 97.6 fL (ref 78.0–100.0)
MCV: 96.4 fL (ref 78.0–100.0)
PLATELETS: 164 10*3/uL (ref 150–400)
PLATELETS: 192 10*3/uL (ref 150–400)
RBC: 2.86 MIL/uL — ABNORMAL LOW (ref 3.87–5.11)
RBC: 3.06 MIL/uL — AB (ref 3.87–5.11)
RDW: 13.3 % (ref 11.5–15.5)
RDW: 13.5 % (ref 11.5–15.5)
WBC: 6.9 10*3/uL (ref 4.0–10.5)
WBC: 7.8 10*3/uL (ref 4.0–10.5)

## 2017-03-28 NOTE — Progress Notes (Addendum)
Capsule endoscopy report -------------------------------- - No evidence of bleeding during the entire Capsule  endoscopy. - Few areas of lymphangiectasia with associated thickening of underlying mucosa noted which may be nonspecific. - Advance diet as tolerated. - Okay to discharge from GI standpoint if hemoglobin remains stable. - GI will sign off. Call us back if needed. - Recommend with follow-up at [facility] if patient desires any endoscopic workup as an outpatient basis.  Kathi DerParag Jeovani Weisenburger MD, FACP 03/28/2017, 2:45 PM  Pager [phone]  If no answer or after 5 PM call [phone]

## 2017-03-28 NOTE — Progress Notes (Signed)
[facility] Progress Note  Maria Bush 83 y.o. 12-21-1929  CC:  GI bleed   Subjective: No acute GI issues. No bowel movement since yesterday evening. According to patient, her bleeding has resolved. Received blood transfusion yesterday  ROS : Negative for fever.  Negative for chest pain and shortness of breath.   Objective: Vital signs in last 24 hours: Vitals:   03/27/17 2124 03/28/17 0606  BP: (!) 144/63 (!) 161/65  Pulse: 76 64  Resp: 18 18  Temp: 98.3 F (36.8 C) 97.8 F (36.6 C)    Physical Exam:  General:  Alert, cooperative, no distress, appears stated age  Head:  Normocephalic, without obvious abnormality, atraumatic  Eyes:  , EOM's intact,   Lungs:   Clear to auscultation bilaterally, respirations unlabored  Heart:  Regular rate and rhythm, Murmur present   Abdomen:   Soft, non-tender, bowel sounds active all four quadrants,  no masses,   Extremities: Extremities normal, atraumatic, no  edema       Lab Results:  Recent Labs  03/26/17 0554 03/26/17 1525 03/27/17 0215  NA 139  --  140  K 3.9  --  3.6  CL 110  --  110  CO2 25  --  24  GLUCOSE 86  --  90  BUN 18  --  13  CREATININE 0.63  --  0.69  CALCIUM 8.0*  --  8.2*  MG  --  1.8  --   PHOS  --  3.1  --     Recent Labs  03/25/17 1621 03/27/17 0215  AST 25 21  ALT 17 14  ALKPHOS 51 39  BILITOT 1.1 1.1  PROT 7.2 5.4*  ALBUMIN 3.7 2.8*    Recent Labs  03/27/17 0816 03/28/17 0239  WBC 5.0 6.9  HGB 7.8* 9.6*  HCT 23.9* 27.9*  MCV 99.6 97.6  PLT 176 164    Recent Labs  03/26/17 1525  LABPROT 14.7  INR 1.14      Assessment/Plan: - Painless hematochezia. GI bleed scan positive for bleeding in the mid abdomen. Possibly small bowel. - Acute blood loss anemia. Hemoglobin Improved after blood transfusion - Bradycardia. - GERD.  Recommendations ------------------------ - No further bleeding episodes. Hemoglobin improved after blood transfusion. Underwent capsule  endoscopy yesterday. Report pending. Although bleeding scan showed possible bleeding from small intestine , given her redundancy of the colon , colonic source such as diverticular bleed cannot be ruled out., In any event patient's bleeding appears to have been resolved. Further plan based on capsule endoscopy finding. Monitor hemoglobin. Transfuse as needed. GI will follow  Kathi DerParag Suhailah Kwan MD, FACP 03/28/2017, 8:29 AM  Pager [phone]  If no answer or after 5 PM call [phone]

## 2017-03-28 NOTE — Progress Notes (Signed)
[facility] Progress Note  Patient: Maria Bush WUJ:[id number]   PCP: Malva Limes, MD DOB: 1929/12/02   DOA: 03/26/2017   DOS: 03/28/2017   Date of Service: the patient was seen and examined on 03/28/2017  Subjective: No further episode with blood in the bowel. No abdominal pain.  Brief hospital course: Pt. with PMH of GERD, HTN, osteoarthritis; admitted on 03/26/2017, presented with complaint of fatigue and dizziness, was found to have GI bleed. Currently further plan is monitor H&H and confusion.  Assessment and Plan: 1. Lower GI bleed. Patient has bright red blood per rectum, still has some bleeding. Nuclear medicine scan was positive for active bleeding although location was debated between the small bowel or large bowel. Vascular surgery was consulted at [facility] and recommended they would not be able to intervene to stop the bleeding. Patient was sent here for further workup, underwent capsule endoscopy. No active bleeding identified. Hemoglobin has remained stable over last 24 hours. No further bleeding reported. Monitor for one more time tomorrow morning and if stable discharge.  2. Essential hypertension. Bradycardia. Continue to monitor on telemetry. Blood pressure relatively stable, continuing home amlodipine, holding lisinopril.  3. Confusion. Patient was mildly confused and disoriented this morning. No undefined etiology. We will continue to monitor.  Diet: clear liquid, advance to soft diet DVT Prophylaxis: mechanical compression device  Advance goals of care discussion: full code  Family Communication: no family was present at bedside, at the time of interview.  Disposition:  Discharge to home.  Consultants: gastroenterology Procedures: capsule endoscopy  Antibiotics: Anti-infectives    None       Objective: Physical Exam: Vitals:   03/27/17 2047 03/27/17 2124 03/28/17 0606 03/28/17 1402  BP: (!) 153/82 (!) 144/63 (!) 161/65 (!) 92/54  Pulse:  (!) 37 76 64 82  Resp: 18 18 18 20   Temp: 98.9 F (37.2 C) 98.3 F (36.8 C) 97.8 F (36.6 C) 98.3 F (36.8 C)  TempSrc: Oral Oral Oral Oral  SpO2: 97% 99% 100% 98%  Weight:      Height:        Intake/Output Summary (Last 24 hours) at 03/28/17 1810 Last data filed at 03/28/17 1245  Gross per 24 hour  Intake              335 ml  Output             2650 ml  Net            -2315 ml   Filed Weights   03/27/17 0913  Weight: 61.2 kg (135 lb)   General: Alert, Awake and Oriented to Time, Place and Person. Appear in mild distress, affect appropriate Eyes: PERRL, Conjunctiva normal ENT: Oral Mucosa clear moist. Neck: no JVD, no Abnormal Mass Or lumps Cardiovascular: S1 and S2 Present, no Murmur, Respiratory: Bilateral Air entry equal and Decreased, no use of accessory muscle, Clear to Auscultation, no Crackles, no wheezes Abdomen: Bowel Sound present, Soft and no tenderness Skin: no redness, no Rash, no induration Extremities: no Pedal edema, no calf tenderness Neurologic: Grossly no focal neuro deficit. Bilaterally Equal motor strength  Data Reviewed: CBC:  Recent Labs Lab 03/26/17 1950 03/27/17 0215 03/27/17 0816 03/28/17 0239 03/28/17 1539  WBC 5.4 5.2 5.0 6.9 7.8  HGB 8.2* 8.1* 7.8* 9.6* 10.2*  HCT 25.2* 25.2* 23.9* 27.9* 29.5*  MCV 100.0 99.6 99.6 97.6 96.4  PLT 180 185 176 164 192   Basic Metabolic Panel:  Recent Labs Lab 03/25/17  1621 03/26/17 0554 03/26/17 1525 03/27/17 0215  NA 138 139  --  140  K 4.5 3.9  --  3.6  CL 102 110  --  110  CO2 29 25  --  24  GLUCOSE 98 86  --  90  BUN 23* 18  --  13  CREATININE 0.83 0.63  --  0.69  CALCIUM 9.0 8.0*  --  8.2*  MG  --   --  1.8  --   PHOS  --   --  3.1  --     Liver Function Tests:  Recent Labs Lab 03/25/17 1621 03/27/17 0215  AST 25 21  ALT 17 14  ALKPHOS 51 39  BILITOT 1.1 1.1  PROT 7.2 5.4*  ALBUMIN 3.7 2.8*   No results for input(s): LIPASE, AMYLASE in the last 168 hours. No results  for input(s): AMMONIA in the last 168 hours. Coagulation Profile:  Recent Labs Lab 03/26/17 1525  INR 1.14   Cardiac Enzymes: No results for input(s): CKTOTAL, CKMB, CKMBINDEX, TROPONINI in the last 168 hours. BNP (last 3 results) No results for input(s): PROBNP in the last 8760 hours. CBG: No results for input(s): GLUCAP in the last 168 hours. Studies: No results found.  Scheduled Meds: . amLODipine  2.5 mg Oral Daily  . latanoprost  1 drop Both Eyes QHS  . sodium chloride flush  3 mL Intravenous Q12H   Continuous Infusions:  PRN Meds: acetaminophen **OR** acetaminophen, fluticasone, ondansetron **OR** ondansetron (ZOFRAN) IV  Time spent: 30 minutes  Author: Lynden OxfordPranav Maudean Hoffmann, MD [facility] Pager: [phone] 03/28/2017 6:10 PM  If 7PM-7AM, please contact night-coverage at [email or web address], password [email or web address]

## 2017-03-29 ENCOUNTER — Encounter (HOSPITAL_COMMUNITY): Payer: Self-pay | Admitting: Gastroenterology

## 2017-03-29 LAB — CBC
HCT: 28.5 % — ABNORMAL LOW (ref 36.0–46.0)
Hemoglobin: 9.9 g/dL — ABNORMAL LOW (ref 12.0–15.0)
MCH: 33.8 pg (ref 26.0–34.0)
MCHC: 34.7 g/dL (ref 30.0–36.0)
MCV: 97.3 fL (ref 78.0–100.0)
Platelets: 174 10*3/uL (ref 150–400)
RBC: 2.93 MIL/uL — ABNORMAL LOW (ref 3.87–5.11)
RDW: 13.3 % (ref 11.5–15.5)
WBC: 6.8 10*3/uL (ref 4.0–10.5)

## 2017-03-29 LAB — BASIC METABOLIC PANEL
Anion gap: 9 (ref 5–15)
BUN: 11 mg/dL (ref 6–20)
CALCIUM: 8.3 mg/dL — AB (ref 8.9–10.3)
CO2: 24 mmol/L (ref 22–32)
CREATININE: 0.73 mg/dL (ref 0.44–1.00)
Chloride: 106 mmol/L (ref 101–111)
GFR calc Af Amer: >60 mL/min (ref >60)
GFR calc non Af Amer: >60 mL/min (ref >60)
GLUCOSE: 87 mg/dL (ref 65–99)
Potassium: 3.4 mmol/L — ABNORMAL LOW (ref 3.5–5.1)
Sodium: 139 mmol/L (ref 135–145)

## 2017-03-29 LAB — TYPE AND SCREEN
ABO/RH(D): A POS
Antibody Screen: NEGATIVE
Unit division: 0

## 2017-03-29 LAB — BPAM RBC
Blood Product Expiration Date: 201806012359
ISSUE DATE / TIME: 201805121752
Unit Type and Rh: 6200

## 2017-03-29 MED ORDER — FERROUS SULFATE 325 (65 FE) MG PO TABS: 325.0000 mg | ORAL_TABLET | Freq: Every day | ORAL | 3 refills | Status: DC

## 2017-03-29 NOTE — Evaluation (Signed)
Physical Therapy Evaluation Patient Details Name: Maria Bush MRN: [id number] DOB: 04-06-1930 Today's Date: 03/29/2017   History of Present Illness  Maria Bush is a 83 y.o. female with medical history significant for hypertension, GERD, glaucoma, osteoarthritis, heart murmur presents to [facility] via [place] due to acute blood loss anemia related to GI bleed.  Clinical Impression  Pt is close to baseline functioning with some notable proximal and core weaknesses that are causing gait instability, but pt should be safe at home with available assist and some follow up HHPT. There are no further acute PT needs.  Will sign off at this time.     Follow Up Recommendations Home health PT;Supervision/Assistance - 24 hour    Equipment Recommendations  None recommended by PT    Recommendations for Other Services       Precautions / Restrictions Precautions Precautions: Fall      Mobility  Bed Mobility Overal bed mobility: Needs Assistance Bed Mobility: Supine to Sit;Sit to Supine     Supine to sit: Supervision Sit to supine: Supervision   General bed mobility comments: no use of the rail, extra time.  Transfers Overall transfer level: Needs assistance   Transfers: Sit to/from Stand Sit to Stand: Min guard         General transfer comment: cues for hand placement, no assist  Ambulation/Gait Ambulation/Gait assistance: Min assist;Min guard Ambulation Distance (Feet): 120 Feet Assistive device: Rolling walker (2 wheeled) Gait Pattern/deviations: Step-through pattern;Trendelenburg   Gait velocity interpretation: Below normal speed for age/gender General Gait Details: L hip drop with w/bearing on the right.  Initially unsteady, but stabilized with distance.  Stairs            Wheelchair Mobility    Modified Rankin (Stroke Patients Only)       Balance Overall balance assessment: Needs assistance Sitting-balance support: No upper extremity  supported Sitting balance-Leahy Scale: Good     Standing balance support: Single extremity supported;Bilateral upper extremity supported Standing balance-Leahy Scale: Poor Standing balance comment: needs some external support for safety                             Pertinent Vitals/Pain Pain Assessment: No/denies pain    Home Living Family/patient expects to be discharged to:: Private residence Living Arrangements: Children Available Help at Discharge: Family;Available 24 hours/day Type of Home: House Home Access: Stairs to enter Entrance Stairs-Rails: Psychiatric nurse of Steps: 2 Home Layout: One level Home Equipment: Walker - 2 wheels      Prior Function Level of Independence: Independent with assistive device(s)         Comments: sometimes RW and sometimes "cruising walls and furniture" in the house     Hand Dominance        Extremity/Trunk Assessment   Upper Extremity Assessment Upper Extremity Assessment: Overall WFL for tasks assessed    Lower Extremity Assessment Lower Extremity Assessment: RLE deficits/detail;LLE deficits/detail RLE Deficits / Details: mild general weakness incl hip flexors and hip abd/pelvic stabilizers LLE Deficits / Details: mild proximal weakness       Communication   Communication: No difficulties  Cognition Arousal/Alertness: Awake/alert Behavior During Therapy: WFL for tasks assessed/performed Overall Cognitive Status: Within Functional Limits for tasks assessed  General Comments      Exercises     Assessment/Plan    PT Assessment All further PT needs can be met in the next venue of care  PT Problem List Decreased strength;Decreased activity tolerance;Decreased mobility;Decreased balance       PT Treatment Interventions      PT Goals (Current goals can be found in the Care Plan section)  Acute Rehab PT Goals PT Goal Formulation:  All assessment and education complete, DC therapy    Frequency     Barriers to discharge        Co-evaluation               AM-PAC PT "6 Clicks" Daily Activity  Outcome Measure Difficulty turning over in bed (including adjusting bedclothes, sheets and blankets)?: None Difficulty moving from lying on back to sitting on the side of the bed? : A Little Difficulty sitting down on and standing up from a chair with arms (e.g., wheelchair, bedside commode, etc,.)?: A Little Help needed moving to and from a bed to chair (including a wheelchair)?: A Little Help needed walking in hospital room?: A Little Help needed climbing 3-5 steps with a railing? : A Little 6 Click Score: 19    End of Session   Activity Tolerance: Patient tolerated treatment well Patient left: in bed;with call bell/phone within reach;with family/visitor present Nurse Communication: Mobility status PT Visit Diagnosis: Other abnormalities of gait and mobility (R26.89);Muscle weakness (generalized) (M62.81)    Time: 1093-2355 PT Time Calculation (min) (ACUTE ONLY): 27 min   Charges:   PT Evaluation $PT Eval Moderate Complexity: 1 Procedure PT Treatments $Gait Training: 8-22 mins   PT G Codes:        04-25-2017  Donnella Sham, PT [phone]  (pager)  Tessie Fass Daizy Outen 04/25/17, 5:38 PM

## 2017-03-29 NOTE — Progress Notes (Signed)
[place] [facility] L Stamas to be D/C'd Home per MD order.  Discussed with the patient and all questions fully answered.  VSS, Skin clean, dry and intact without evidence of skin break down, no evidence of skin tears noted. IV catheter discontinued intact. Site without signs and symptoms of complications. Dressing and pressure applied.  An After Visit Summary was printed and given to the patient.   D/c education completed with patient/family including follow up instructions, medication list, d/c activities limitations if indicated, with other d/c instructions as indicated by MD - patient able to verbalize understanding, all questions fully answered.   Patient instructed to return to ED, call 911, or call MD for any changes in condition.   Patient escorted via WC, and D/C home via private auto.  Rock NephewLisbeth  Aqua Denslow 03/29/2017 6:57 PM

## 2017-03-30 ENCOUNTER — Encounter (HOSPITAL_COMMUNITY): Payer: Self-pay | Admitting: Emergency Medicine

## 2017-03-30 ENCOUNTER — Observation Stay (HOSPITAL_COMMUNITY)
Admission: EM | Admit: 2017-03-30 | Discharge: 2017-03-30 | Disposition: A | Discharge status: Home / Self Care | Payer: Medicare Other | Attending: Internal Medicine | Admitting: Internal Medicine

## 2017-03-30 DIAGNOSIS — Z79899 Other long term (current) drug therapy: Secondary | ICD-10-CM | POA: Insufficient documentation

## 2017-03-30 DIAGNOSIS — M5136 Other intervertebral disc degeneration, lumbar region: Secondary | ICD-10-CM | POA: Insufficient documentation

## 2017-03-30 DIAGNOSIS — Z880 Allergy status to penicillin: Secondary | ICD-10-CM | POA: Diagnosis not present

## 2017-03-30 DIAGNOSIS — K625 Hemorrhage of anus and rectum: Secondary | ICD-10-CM | POA: Diagnosis not present

## 2017-03-30 DIAGNOSIS — K2100 Gastro-esophageal reflux disease with esophagitis, without bleeding: Secondary | ICD-10-CM | POA: Insufficient documentation

## 2017-03-30 DIAGNOSIS — D500 Iron deficiency anemia secondary to blood loss (chronic): Secondary | ICD-10-CM | POA: Diagnosis not present

## 2017-03-30 DIAGNOSIS — D62 Acute posthemorrhagic anemia: Secondary | ICD-10-CM | POA: Insufficient documentation

## 2017-03-30 DIAGNOSIS — K922 Gastrointestinal hemorrhage, unspecified: Secondary | ICD-10-CM | POA: Diagnosis not present

## 2017-03-30 DIAGNOSIS — I10 Essential (primary) hypertension: Secondary | ICD-10-CM | POA: Insufficient documentation

## 2017-03-30 DIAGNOSIS — E538 Deficiency of other specified B group vitamins: Secondary | ICD-10-CM | POA: Diagnosis not present

## 2017-03-30 DIAGNOSIS — F329 Major depressive disorder, single episode, unspecified: Secondary | ICD-10-CM | POA: Insufficient documentation

## 2017-03-30 DIAGNOSIS — I2510 Atherosclerotic heart disease of native coronary artery without angina pectoris: Secondary | ICD-10-CM | POA: Diagnosis not present

## 2017-03-30 DIAGNOSIS — D509 Iron deficiency anemia, unspecified: Secondary | ICD-10-CM | POA: Diagnosis present

## 2017-03-30 LAB — I-STAT CHEM 8, ED
BUN: 19 mg/dL (ref 6–20)
CALCIUM ION: 1.11 mmol/L — AB (ref 1.15–1.40)
CHLORIDE: 106 mmol/L (ref 101–111)
Creatinine, Ser: 0.9 mg/dL (ref 0.44–1.00)
GLUCOSE: 97 mg/dL (ref 65–99)
HCT: 30 % — ABNORMAL LOW (ref 36.0–46.0)
Hemoglobin: 10.2 g/dL — ABNORMAL LOW (ref 12.0–15.0)
Potassium: 3.9 mmol/L (ref 3.5–5.1)
Sodium: 140 mmol/L (ref 135–145)
TCO2: 24 mmol/L (ref 0–100)

## 2017-03-30 LAB — COMPREHENSIVE METABOLIC PANEL
ALBUMIN: 3.4 g/dL — AB (ref 3.5–5.0)
ALK PHOS: 56 U/L (ref 38–126)
ALT: 16 U/L (ref 14–54)
AST: 22 U/L (ref 15–41)
Anion gap: 9 (ref 5–15)
BILIRUBIN TOTAL: 0.7 mg/dL (ref 0.3–1.2)
BUN: 19 mg/dL (ref 6–20)
CALCIUM: 8.6 mg/dL — AB (ref 8.9–10.3)
CO2: 22 mmol/L (ref 22–32)
Chloride: 107 mmol/L (ref 101–111)
Creatinine, Ser: 1.01 mg/dL — ABNORMAL HIGH (ref 0.44–1.00)
GFR calc Af Amer: 58 mL/min — ABNORMAL LOW (ref >60)
GFR calc non Af Amer: 50 mL/min — ABNORMAL LOW (ref >60)
GLUCOSE: 128 mg/dL — AB (ref 65–99)
Potassium: 3.8 mmol/L (ref 3.5–5.1)
Sodium: 138 mmol/L (ref 135–145)
TOTAL PROTEIN: 6.3 g/dL — AB (ref 6.5–8.1)

## 2017-03-30 LAB — CBC
HCT: 30.8 % — ABNORMAL LOW (ref 36.0–46.0)
Hemoglobin: 10.2 g/dL — ABNORMAL LOW (ref 12.0–15.0)
MCH: 32.8 pg (ref 26.0–34.0)
MCHC: 33.1 g/dL (ref 30.0–36.0)
MCV: 99 fL (ref 78.0–100.0)
Platelets: 200 10*3/uL (ref 150–400)
RBC: 3.11 MIL/uL — AB (ref 3.87–5.11)
RDW: 13.3 % (ref 11.5–15.5)
WBC: 8.5 10*3/uL (ref 4.0–10.5)

## 2017-03-30 LAB — CBC WITH DIFFERENTIAL/PLATELET
BASOS PCT: 0 %
Basophils Absolute: 0 10*3/uL (ref 0.0–0.1)
Eosinophils Absolute: 0 10*3/uL (ref 0.0–0.7)
Eosinophils Relative: 0 %
HCT: 29.6 % — ABNORMAL LOW (ref 36.0–46.0)
HEMOGLOBIN: 9.9 g/dL — AB (ref 12.0–15.0)
Lymphocytes Relative: 7 %
Lymphs Abs: 0.8 10*3/uL (ref 0.7–4.0)
MCH: 32.7 pg (ref 26.0–34.0)
MCHC: 33.4 g/dL (ref 30.0–36.0)
MCV: 97.7 fL (ref 78.0–100.0)
MONOS PCT: 4 %
Monocytes Absolute: 0.4 10*3/uL (ref 0.1–1.0)
NEUTROS ABS: 9.8 10*3/uL — AB (ref 1.7–7.7)
NEUTROS PCT: 89 %
Platelets: 222 10*3/uL (ref 150–400)
RBC: 3.03 MIL/uL — ABNORMAL LOW (ref 3.87–5.11)
RDW: 13.2 % (ref 11.5–15.5)
WBC: 11 10*3/uL — ABNORMAL HIGH (ref 4.0–10.5)

## 2017-03-30 LAB — PROTIME-INR
INR: 1.07
Prothrombin Time: 13.9 seconds (ref 11.4–15.2)

## 2017-03-30 LAB — TYPE AND SCREEN
ABO/RH(D): A POS
Antibody Screen: NEGATIVE

## 2017-03-30 MED ORDER — SODIUM CHLORIDE 0.9 % IV SOLN: INTRAVENOUS | Status: DC

## 2017-03-30 NOTE — ED Notes (Signed)
Pt reports she was just discharged from the Hospital last envening at 1900 hrs for a GI Bleed. Pt states there was a few blood clots in her stool. Pt was most concerned about the ABD cramping she was having.

## 2017-03-30 NOTE — ED Provider Notes (Signed)
[facility]-EMERGENCY DEPT Provider Note   CSN: [id number] Arrival date & time: 03/30/17  0108     History   Chief Complaint Chief Complaint  Patient presents with  . Rectal Bleeding    HPI Maria Bush is a 83 y.o. female.  Patient returns to the hospital concerned for continuation of rectal bleeding and abdominal cramping. No fever, nausea, vomiting. She was hospitalized from 03/26/17 to 03/29/17 in evaluation and treatment of lower GI bleed, including transfusion. She went home yesterday and felt her bleeding had resolved but returned when she had recurrent symptoms during the night. She denies lightheadedness, dizziness, syncope or near syncope. No pain currently.   The history is provided by the patient. No language interpreter was used.  Rectal Bleeding  Associated symptoms: abdominal pain   Associated symptoms: no dizziness, no fever, no light-headedness and no vomiting     Past Medical History:  Diagnosis Date  . Acute blood loss anemia   . Arthritis 2005  . Diverticulosis   . GI bleed   . Heart murmur 2005  . Hypertension     Patient Active Problem List   Diagnosis Date Noted  . Acute post-hemorrhagic anemia 03/26/2017  . Bradycardia 03/26/2017  . Acute blood loss anemia 03/26/2017  . Diverticulosis   . GI bleed 03/25/2017  . Left shoulder pain 12/26/2015  . Edema 04/25/2015  . Shortness of breath 04/25/2015  . Unsteady gait 04/25/2015  . Allergic rhinitis 04/24/2015  . Arthropathia 04/24/2015  . B12 deficiency 04/24/2015  . Atherosclerosis of coronary artery 04/24/2015  . DDD (degenerative disc disease), lumbar 04/24/2015  . Clinical depression 04/24/2015  . DD (diverticular disease) 04/24/2015  . Elevated CK 04/24/2015  . Atrial premature depolarization 04/24/2015  . Hypercholesteremia 04/24/2015  . BP (high blood pressure) 04/24/2015  . Anemia, iron deficiency 04/24/2015  . 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 Lower esophageal ring 07/12/2014  .  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 Urge incontinence of urine 09/01/2013  . Family history of malignant neoplasm of breast 02/10/2013  . Diffuse cystic mastopathy 02/10/2013  . Arthritis     Past Surgical History:  Procedure Laterality Date  . ABDOMINAL HYSTERECTOMY  1981  . BLADDER SURGERY  1992  . BREAST BIOPSY  1992  . CARDIAC CATHETERIZATION  2011  . CATARACT EXTRACTION Bilateral 2015  . CHOLECYSTECTOMY  08/09/2000  . COLONOSCOPY  1982  . DILATION AND CURETTAGE OF UTERUS  1979  . GIVENS CAPSULE STUDY N/A 03/27/2017   Procedure: GIVENS CAPSULE STUDY;  Surgeon: Bernette RedbirdBuccini, Robert, MD;  [facility];  Service: Endoscopy;  Laterality: N/A;  . RECTOCELE REPAIR  1994    OB History    Gravida Para Term Preterm AB Living   2 2           SAB TAB Ectopic Multiple Live Births         3        Obstetric Comments   First pregnancy 7528 First menstrual 20       Home Medications    Prior to Admission medications   Medication Sig Start Date End Date Taking? Authorizing Provider  amLODipine (NORVASC) 2.5 MG tablet Take 1 tablet (2.5 mg total) by mouth daily. 03/18/17   Malva LimesFisher, Donald E, MD  Calcium Carb-Cholecalciferol (CALCIUM 600 + D PO) Take by mouth.    [provider]  ferrous sulfate 325 (65 FE) MG tablet Take 1 tablet (325 mg total) by mouth daily.  03/29/17 03/29/18  Rolly Salter, MD  fluticasone (FLONASE) 50 MCG/ACT nasal spray Place 1 spray into both nostrils daily as needed.  01/23/13   [provider]  lisinopril (PRINIVIL,ZESTRIL) 20 MG tablet TAKE 1 TABLET BY MOUTH ONCE A DAY - MUST MAKE APPOINTMENT WITH DOCTOR Patient taking differently: TAKE 1 TABLET BY MOUTH ONCE A DAY 10/26/16   Malva Limes, MD  loratadine (CLARITIN) 10 MG tablet Take 10 mg by mouth daily as needed.  02/06/11   [provider]  Multiple Minerals-Vitamins (CALCIUM & VIT D3 BONE HEALTH PO) Take 1 tablet by mouth daily.     [provider]  omeprazole (PRILOSEC) 40 MG capsule TAKE ONE (1) CAPSULE EACH DAY 01/23/17   Malva Limes, MD  traMADol (ULTRAM) 50 MG tablet Take 1 tablet (50 mg total) by mouth every 8 (eight) hours as needed. 03/08/17   Malva Limes, MD  Travoprost, BAK Free, (TRAVATAN Z) 0.004 % SOLN ophthalmic solution Place 1 drop into both eyes at bedtime.     [provider]    Family History Family History  Problem Relation Age of Onset  . Breast cancer Sister   . Early death Mother   . Stroke Father   . Heart disease Brother   . Heart attack Brother   . Heart disease Brother   . Heart attack Brother     Social History Social History  Substance Use Topics  . Smoking status: Never Smoker  . Smokeless tobacco: Never Used  . Alcohol use No     Allergies   Cephalexin; Prednisone; Sulfa antibiotics; and Penicillins   Review of Systems Review of Systems  Constitutional: Negative for chills, fatigue and fever.  HENT: Negative.   Respiratory: Negative.  Negative for shortness of breath.   Cardiovascular: Negative.  Negative for chest pain.  Gastrointestinal: Positive for abdominal pain, blood in stool and hematochezia. Negative for abdominal distention, nausea and vomiting.  Musculoskeletal: Negative.  Negative for myalgias.  Skin: Negative.   Neurological: Negative.  Negative for dizziness, syncope and light-headedness.     Physical Exam Updated Vital Signs BP 131/81 (BP Location: Right Arm)   Pulse 66   Temp 98.9 F (37.2 C) (Oral)   Resp 18   SpO2 100%   Physical Exam  Constitutional: She is oriented to person, place, and time. She appears well-developed and well-nourished.  Eyes: Conjunctivae are normal.  Neck: Normal range of motion.  Cardiovascular: Normal rate.   Pulmonary/Chest: Effort normal. No respiratory distress.  Abdominal: She exhibits no distension. There is no tenderness.  Genitourinary:  Genitourinary Comments: Gross blood on  rectal exam with very little stool.   Neurological: She is alert and oriented to person, place, and time.  Skin: Skin is warm and dry.     ED Treatments / Results  Labs (all labs ordered are listed, but only abnormal results are displayed) Labs Reviewed  CBC WITH DIFFERENTIAL/PLATELET - Abnormal; Notable for the following:       Result Value   WBC 11.0 (*)    RBC 3.03 (*)    Hemoglobin 9.9 (*)    HCT 29.6 (*)    Neutro Abs 9.8 (*)    All other components within normal limits  COMPREHENSIVE METABOLIC PANEL - Abnormal; Notable for the following:    Glucose, Bld 128 (*)    Creatinine, Ser 1.01 (*)    Calcium 8.6 (*)    Total Protein 6.3 (*)    Albumin 3.4 (*)  GFR calc non Af Amer 50 (*)    GFR calc Af Amer 58 (*)    All other components within normal limits  PROTIME-INR  TYPE AND SCREEN    EKG  EKG Interpretation None       Radiology No results found.  Procedures Procedures (including critical care time)  Medications Ordered in ED Medications - No data to display   Initial Impression / Assessment and Plan / ED Course  I have reviewed the triage vital signs and the nursing notes.  Pertinent labs & imaging results that were available during my care of the patient were reviewed by me and considered in my medical decision making (see chart for details).     Patient returns to the hospital after discharge home yesterday following admission for GI bleeding requiring transfusion. She began experiencing recurrent upper abdominal cramping and seeing blood per rectum during the night. She reports pain is resolved. Chart reviewed. Nuclear medicine scan was positive for bleeding but undetermined where - small bowel vs colon. Small bowel follow through performed which did not show any bleeding.    Hemoglobin this morning is stable when compared to discharge yesterday. She does have grossly bloody rectal exam this morning, however, so bleeding has reoccurred. VSS. Discussed  with Dr. Bebe Shaggy. Feel the patient will need to be observed over the next 24 hours for persistent bleeding and/or drop in hemoglobin.   [facility] paged.   Final Clinical Impressions(s) / ED Diagnoses   Final diagnoses:  None   1. Recurrent GI bleeding  New Prescriptions New Prescriptions   No medications on file     Elpidio Anis, Cordelia Poche 03/30/17 1610    Zadie Rhine, MD 03/30/17 [phone]

## 2017-03-30 NOTE — Consult Note (Signed)
Medical Consultation   JEANANN BALINSKI  ZOX:[id number]  DOB: June 22, 1930  DOA: 03/30/2017  PCP: Malva Limes, MD    Requesting physician: EDP  Reason for consultation: Gi bleed     History of Present Illness: Maria Bush is an 83 y.o. female recently hospitalized from 5/11 -5/14 for evaluation and treatment for lower GIB, which included transfusion of blood and endoscopies. At the time, Nuclear medicine scan was positive for undetermined source of bleeding but SB follow trough was negative for bleeding (see well detailed progress note from 5/14)  Patient returned to the ED this morning after noting some diffuse abdominal cramping during the night and noted some clots in her stools . Denies lightheadedness, dizziness, syncope, presyncope. Her pain has resolved. Denies fevers, chills, night sweats, vision changes, or mucositis. Denies any respiratory complaints. Denies any chest pain or palpitations. Denies lower extremity swelling. Denies nausea or vomiting.  Appetite is normal. Denies any dysuria. Denies abnormal skin rashes, or neuropathy. Denies any other bleeding issues such as epistaxis, hematemesis, hematuria. No changes in her meds    WBC 11 Hb 10.2, slightly improved from 9.9  from prior Glu 128 Cr 1.01 GFR 58  No EKG available for review   Review of Systems:  As per HPI otherwise 10 point review of systems negative.     Past Medical History: Past Medical History:  Diagnosis Date  . Acute blood loss anemia   . Arthritis 2005  . Diverticulosis   . GI bleed   . Heart murmur 2005  . Hypertension     Past Surgical History: Past Surgical History:  Procedure Laterality Date  . ABDOMINAL HYSTERECTOMY  1981  . BLADDER SURGERY  1992  . BREAST BIOPSY  1992  . CARDIAC CATHETERIZATION  2011  . CATARACT EXTRACTION Bilateral 2015  . CHOLECYSTECTOMY  08/09/2000  . COLONOSCOPY  1982  . DILATION AND CURETTAGE OF UTERUS  1979  . GIVENS CAPSULE STUDY N/A  03/27/2017   Procedure: GIVENS CAPSULE STUDY;  Surgeon: Bernette Redbird, MD;  Location[facility] ENDOSCOPY;  Service: Endoscopy;  Laterality: N/A;  . RECTOCELE REPAIR  1994     Allergies:   Allergies  Allergen Reactions  . Cephalexin Shortness Of Breath  . Prednisone Nausea And Vomiting  . Sulfa Antibiotics Itching  . Penicillins Rash     Social History: Social History   Social History  . Marital status: Widowed    Spouse name: N/A  . Number of children: 2  . Years of education: HS   Occupational History  . Retired     Education officer, environmental   Social History Main Topics  . Smoking status: Never Smoker  . Smokeless tobacco: Never Used  . Alcohol use No  . Drug use: No  . Sexual activity: Not on file   Other Topics Concern  . Not on file   Social History Narrative   Pt lives in [place]. Her daughter lives with her, with 3 grandchildren 52, 69, 37. Her daughter is a Research scientist (medical). Pt takes care of her grandchildren at home.        Family History: Family History  Problem Relation Age of Onset  . Breast cancer Sister   . Early death Mother   . Stroke Father   . Heart disease Brother   . Heart attack Brother   . Heart disease Brother   . Heart attack Brother  Family history reviewed and not pertinent    Physical Exam: Vitals:   03/30/17 0830 03/30/17 0845 03/30/17 0900 03/30/17 0903  BP: 140/64 135/65 127/73 127/73  Pulse: (!) 57 (!) 58 (!) 53 60  Resp: 17 12 18 15   Temp:      TempSrc:      SpO2: 100% 100% 100% 100%    Constitutional: Appears calm,  alert and awake, oriented x3, not in any acute distress. Frail appearing  Eyes: PERLA, EOMI, irises appear normal, anicteric sclera,  ENMT: external ears and nose appear normal. Mildly hard of hearing. Tongue without exudates, moist  Neck: neck appears normal, no masses, normal ROM, no thyromegaly, no JVD  CVS: S1-S2 clear, no murmur rubs or gallops, no LE edema, normal pedal pulses  Respiratory: clear to  auscultation bilaterally, no wheezing, rales or rhonchi. Respiratory effort normal. No accessory muscle use.  Abdomen: soft nontender, nondistended, normal bowel sounds, no hepatosplenomegaly, no hernias  Musculoskeletal: no cyanosis, clubbing or edema noted bilaterally. Joint/bones/muscle exam, strength, contractures or atrophy Neuro: Cranial nerves II-XII intact, strength, sensation, reflexes Skin: no rashes or lesions or ulcers, no induration or nodules   Data reviewed:  I have personally reviewed following labs and imaging studies Labs:  CBC:  Recent Labs Lab 03/28/17 0239 03/28/17 1539 03/29/17 0230 03/30/17 0133 03/30/17 0742 03/30/17 0805  WBC 6.9 7.8 6.8 11.0* 8.5  --   NEUTROABS  --   --   --  9.8*  --   --   HGB 9.6* 10.2* 9.9* 9.9* 10.2* 10.2*  HCT 27.9* 29.5* 28.5* 29.6* 30.8* 30.0*  MCV 97.6 96.4 97.3 97.7 99.0  --   PLT 164 192 174 222 200  --     Basic Metabolic Panel:  Recent Labs Lab 03/25/17 1621 03/26/17 0554 03/26/17 1525 03/27/17 0215 03/29/17 0729 03/30/17 0133 03/30/17 0805  NA 138 139  --  140 139 138 140  K 4.5 3.9  --  3.6 3.4* 3.8 3.9  CL 102 110  --  110 106 107 106  CO2 29 25  --  24 24 22   --   GLUCOSE 98 86  --  90 87 128* 97  BUN 23* 18  --  13 11 19 19   CREATININE 0.83 0.63  --  0.69 0.73 1.01* 0.90  CALCIUM 9.0 8.0*  --  8.2* 8.3* 8.6*  --   MG  --   --  1.8  --   --   --   --   PHOS  --   --  3.1  --   --   --   --    GFR Estimated Creatinine Clearance: 36.8 mL/min (by C-G formula based on SCr of 0.9 mg/dL). Liver Function Tests:  Recent Labs Lab 03/25/17 1621 03/27/17 0215 03/30/17 0133  AST 25 21 22   ALT 17 14 16   ALKPHOS 51 39 56  BILITOT 1.1 1.1 0.7  PROT 7.2 5.4* 6.3*  ALBUMIN 3.7 2.8* 3.4*   No results for input(s): LIPASE, AMYLASE in the last 168 hours. No results for input(s): AMMONIA in the last 168 hours. Coagulation profile  Recent Labs Lab 03/26/17 1525 03/30/17 0133  INR 1.14 1.07    Cardiac  Enzymes: No results for input(s): CKTOTAL, CKMB, CKMBINDEX, TROPONINI in the last 168 hours. BNP: Invalid input(s): POCBNP CBG: No results for input(s): GLUCAP in the last 168 hours. D-Dimer No results for input(s): DDIMER in the last 72 hours. Hgb A1c No results for  input(s): HGBA1C in the last 72 hours. Lipid Profile No results for input(s): CHOL, HDL, LDLCALC, TRIG, CHOLHDL, LDLDIRECT in the last 72 hours. Thyroid function studies No results for input(s): TSH, T4TOTAL, T3FREE, THYROIDAB in the last 72 hours.  Invalid input(s): FREET3 Anemia work up No results for input(s): VITAMINB12, FOLATE, FERRITIN, TIBC, IRON, RETICCTPCT in the last 72 hours. Urinalysis    Component Value Date/Time   COLORURINE Straw 08/14/2014 2204   APPEARANCEUR Clear 08/14/2014 2204   LABSPEC 1.006 08/14/2014 2204   PHURINE 7.0 08/14/2014 2204   GLUCOSEU Negative 08/14/2014 2204   HGBUR Negative 08/14/2014 2204   BILIRUBINUR Neg 03/08/2017 1118   BILIRUBINUR Negative 08/14/2014 2204   KETONESUR Negative 08/14/2014 2204   PROTEINUR Neg 03/08/2017 1118   PROTEINUR Negative 08/14/2014 2204   UROBILINOGEN 0.2 03/08/2017 1118   NITRITE Neg 03/08/2017 1118   NITRITE Negative 08/14/2014 2204   LEUKOCYTESUR Moderate (2+) (A) 03/08/2017 1118   LEUKOCYTESUR Negative 08/14/2014 2204     Sepsis Labs Invalid input(s): PROCALCITONIN,  WBC,  LACTICIDVEN Microbiology No results found for this or any previous visit (from the past 240 hour(s)).     Inpatient Medications:   Scheduled Meds: Continuous Infusions: . sodium chloride       Radiological Exams on Admission: No results found.  Impression/Recommendations Active Problems:   GIB (gastrointestinal bleeding)   BP (high blood pressure)   Anemia, iron deficiency   Gastro Intestinal Bleed likely residual from recent admission. History of Iron deficiency anemia  Hb stable 10.2 . Recent w/u, including capsule study was essentially negative, no  source of bleeding was noted Continue Prilosec daily Recommend Iron supplement Patient is safe for discharge, recommend follow up with PCP, and to repeat CBC in 1-2 days    Hypertension BP 127/73   Pulse 60   Controlled Continue home anti-hypertensive medications      Thank you for this consultation.  Our [facility] hospitalist team will follow the patient with you.     [facility] E PA-C [facility] 03/30/2017, 9:29 AM

## 2017-03-30 NOTE — Progress Notes (Signed)
Pending admission per PA, Shari upstill.  83 year old lady with past medical history of hypertension, diverticulosis, anemia, GI bleeding, who presents with rectal bleeding  She was hospitalized from 03/26/17 to 03/29/17 in evaluation and treatment of lower GI bleed, including transfusion. She went home yesterday, has recurrently rectal bleeding during the night. She denies lightheadedness, dizziness, syncope or near syncope. No pain currently. Hemoglobin 9.9 which was 10.2 on 03/28/17. In previous admission, nuclear medicine scan was positive for active bleeding although location was debated between the small bowel or large bowel. Vascular surgery was consulted at [facility] and recommended they would not be able to intervene to stop the bleeding. Pt underwent capsule endoscopy., but no active bleeding identified. Pt is accepted to tele bed for obs.  Lorretta HarpXilin Gerrald Basu, MD  [facility] Pager [phone]  If 7PM-7AM, please contact night-coverage [email or web address] Password [email or web address] 03/30/2017, 6:49 AM

## 2017-03-30 NOTE — ED Triage Notes (Addendum)
Pt. reports rectal bleeding with mild low abdominal cramping onset last week , she was admitted 03/25/2017 for GI bleeding . Denies emesis or diarrhea . No fever or chills .

## 2017-03-31 ENCOUNTER — Ambulatory Visit: Payer: Self-pay | Admitting: General Surgery

## 2017-03-31 NOTE — Discharge Summary (Signed)
[facility] Discharge Summary   Patient: Maria Bush:[id number]   PCP: Malva Limes, MD DOB: 1930/08/31   Date of admission: 03/26/2017   Date of discharge: 03/29/2017     Discharge Diagnoses:  Principal Problem:   Acute blood loss anemia Active Problems:   B12 deficiency   BP (high blood pressure)   GI bleed   Bradycardia   Diverticulosis   Admitted From: home Disposition:  home  Recommendations for Outpatient Follow-up:  1. Please follow up with PCP in 1 week   Follow-up Information    Fisher, Demetrios Isaacs, MD. Schedule an appointment as soon as possible for a visit in 1 week(s).   Specialty:  Family Medicine Contact information: [address] [place] [address] [place] [address] [phone]        [facility], [facility] Follow up.   Why:  home health services arranged , office will call and set up home visits Contact information: [address] [place] [address] [phone]          Diet recommendation: soft diet  Activity: The patient is advised to gradually reintroduce usual activities.  Discharge Condition: good  Code Status: full code  History of present illness: As per the H and P dictated on admission, "Maria Bush is a 83 y.o. female with medical history significant for hypertension, GERD, glaucoma, osteoarthritis, heart murmur presents to room 11 on [facility] at [facility] from [facility] complaint acute blood loss anemia likely related to GI bleed.  Information is obtained from the patient. She reports to 4 day history of bright red blood per rectum. She also reports she thinks "it's coming from the front". She denies headache visual disturbances dizziness syncope or near-syncope. She denies shortness of breath chest pain palpitations lower extremity edema. She denies abdominal pain nausea vomiting diarrhea. She denies dysuria hematuria frequency or urgency. She denies fever chills recent travel or sick  contacts"  Hospital Course:  Summary of her active problems in the hospital is as following. 1. Lower GI bleed. Patient has bright red blood per rectum, still has some bleeding. Nuclear medicine scan was positive for active bleeding although location was debated between the small bowel or large bowel. Vascular surgery was consulted at [facility] and recommended they would not be able to intervene to stop the bleeding. Patient was sent here for further workup, underwent capsule endoscopy. No active bleeding identified. Hemoglobin has remained stable over last 24 hours. Very minimal bleeding reported. Recommended to follow up with PCP in 1 week and will benefit from establishing with gastroenterology in [place].  2. Essential hypertension. Bradycardia. Continue to monitor on telemetry. Blood pressure relatively stable, continuing home amlodipine, lisinopril.  3. Confusion. Patient was mildly confused and disoriented this morning. No undefined etiology. We will continue to monitor.  All other chronic medical condition were stable during the hospitalization.  Patient was seen by physical therapy, who recommended home health, which was arranged by Child psychotherapist and case Production designer, theatre/television/film. On the day of the discharge the patient's vitals were stable , and no other acute medical condition were reported by patient. the patient was felt safe to be discharge at home with home health.  Procedures and Results:  Capsule endoscopy   Consultations:  Gastroenterology   DISCHARGE MEDICATION: Discharge Medication List as of 03/29/2017  5:44 PM    START taking these medications   Details  ferrous sulfate 325 (65 FE) MG tablet Take 1 tablet (325 mg total) by mouth  daily., Starting Mon 03/29/2017, Until Tue 03/29/2018, Normal      CONTINUE these medications which have NOT CHANGED   Details  amLODipine (NORVASC) 2.5 MG tablet Take 1 tablet (2.5 mg total) by mouth daily., Starting Thu 03/18/2017, Normal      fluticasone (FLONASE) 50 MCG/ACT nasal spray Place 1 spray into both nostrils daily as needed. , Starting Mon 01/23/2013, Historical Med    lisinopril (PRINIVIL,ZESTRIL) 20 MG tablet TAKE 1 TABLET BY MOUTH ONCE A DAY - MUST MAKE APPOINTMENT WITH DOCTOR, Normal    loratadine (CLARITIN) 10 MG tablet Take 10 mg by mouth daily as needed. , Starting Fri 02/06/2011, Historical Med    omeprazole (PRILOSEC) 40 MG capsule TAKE ONE (1) CAPSULE EACH DAY, Normal    traMADol (ULTRAM) 50 MG tablet Take 1 tablet (50 mg total) by mouth every 8 (eight) hours as needed., Starting Mon 03/08/2017, Print    Travoprost, BAK Free, (TRAVATAN Z) 0.004 % SOLN ophthalmic solution Place 1 drop into both eyes at bedtime. , Historical Med    Calcium Carb-Cholecalciferol (CALCIUM 600 + D PO) Take by mouth., Historical Med    Multiple Minerals-Vitamins (CALCIUM & VIT D3 BONE HEALTH PO) Take 1 tablet by mouth daily., Historical Med       Allergies  Allergen Reactions  . Cephalexin Shortness Of Breath  . Prednisone Nausea And Vomiting  . Sulfa Antibiotics Itching  . Penicillins Rash   Discharge Instructions    Diet - low sodium heart healthy    Complete by:  As directed    Discharge instructions    Complete by:  As directed    It is important that you read following instructions as well as go over your medication list with RN to help you understand your care after this hospitalization.  Discharge Instructions: Please follow-up with PCP in one week  Please request your primary care physician to go over all Hospital Tests and Procedure/Radiological results at the follow up,  Please get all Hospital records sent to your PCP by signing hospital release before you go home.   Do not take more than prescribed Pain, Sleep and Anxiety Medications. You were cared for by a hospitalist during your hospital stay. If you have any questions about your discharge medications or the care you received while you were in the  hospital after you are discharged, you can call the unit and ask to speak with the hospitalist on call if the hospitalist that took care of you is not available.  Once you are discharged, your primary care physician will handle any further medical issues. Please note that NO REFILLS for any discharge medications will be authorized once you are discharged, as it is imperative that you return to your primary care physician (or establish a relationship with a primary care physician if you do not have one) for your aftercare needs so that they can reassess your need for medications and monitor your lab values. You Must read complete instructions/literature along with all the possible adverse reactions/side effects for all the Medicines you take and that have been prescribed to you. Take any new Medicines after you have completely understood and accept all the possible adverse reactions/side effects. Wear Seat belts while driving. If you have smoked or chewed Tobacco in the last 2 yrs please stop smoking and/or stop any Recreational drug use.   Increase activity slowly    Complete by:  As directed      Discharge Exam: Filed Weights   03/27/17  [id number] 03/29/17 0409  Weight: 61.2 kg (135 lb) 59 kg (130 lb 1.1 oz)   Vitals:   03/29/17 1006 03/29/17 1508  BP: (!) 102/53 (!) 97/57  Pulse:  78  Resp:  16  Temp:  98.3 F (36.8 C)   General: Appear in no distress, no Rash; Oral Mucosa moist. Cardiovascular: S1 and S2 Present, no Murmur, no JVD Respiratory: Bilateral Air entry present and Clear to Auscultation, no Crackles, no wheezes Abdomen: Bowel Sound present, Soft and no tenderness Extremities: no Pedal edema, no calf tenderness Neurology: Grossly no focal neuro deficit.  The results of significant diagnostics from this hospitalization (including imaging, microbiology, ancillary and laboratory) are listed below for reference.    Significant Diagnostic Studies: Nm Gi Blood Loss  Result Date:  03/26/2017 CLINICAL DATA:  Lower GI bleed. Active bleeding currently. Fatigue and weakness. EXAM: NUCLEAR MEDICINE GASTROINTESTINAL BLEEDING SCAN TECHNIQUE: Sequential abdominal images were obtained following intravenous administration of Tc-6460m labeled red blood cells. RADIOPHARMACEUTICALS:  23.21 mCi Tc-6560m in-vitro labeled red cells. COMPARISON:  CT abdomen and pelvis 08/15/2014 FINDINGS: Radiotracer accumulation is demonstrated centrally in the abdomen consistent with an acute gastrointestinal bleed, likely in the mid abdominal small bowel. Normal blood pool activity. IMPRESSION: Acute gastrointestinal bleeding, likely in the mid abdominal small bowel. These results were called by telephone at the time of interpretation on 03/26/2017 at 5:52 am to [facility] Marcella, who verbally acknowledged these results. Electronically Signed   By: Burman NievesWilliam  Stevens M.D.   On: 03/26/2017 05:55    Microbiology: No results found for this or any previous visit (from the past 240 hour(s)).   Labs: CBC:  Recent Labs Lab 03/28/17 0239 03/28/17 1539 03/29/17 0230  WBC 6.9 7.8 6.8  NEUTROABS  --   --   --   HGB 9.6* 10.2* 9.9*  HCT 27.9* 29.5* 28.5*  MCV 97.6 96.4 97.3  PLT 164 192 174   Basic Metabolic Panel:  Recent Labs Lab 03/25/17 1621 03/26/17 0554 03/26/17 1525 03/27/17 0215 03/29/17 0729  NA 138 139  --  140 139  K 4.5 3.9  --  3.6 3.4*  CL 102 110  --  110 106  CO2 29 25  --  24 24  GLUCOSE 98 86  --  90 87  BUN 23* 18  --  13 11  CREATININE 0.83 0.63  --  0.69 0.73  CALCIUM 9.0 8.0*  --  8.2* 8.3*  MG  --   --  1.8  --   --   PHOS  --   --  3.1  --   --    Liver Function Tests:  Recent Labs Lab 03/25/17 1621 03/27/17 0215  AST 25 21  ALT 17 14  ALKPHOS 51 39  BILITOT 1.1 1.1  PROT 7.2 5.4*  ALBUMIN 3.7 2.8*   No results for input(s): LIPASE, AMYLASE in the last 168 hours. No results for input(s): AMMONIA in the last 168 hours. Cardiac Enzymes: No results for input(s): CKTOTAL,  CKMB, CKMBINDEX, TROPONINI in the last 168 hours. BNP (last 3 results) No results for input(s): BNP in the last 8760 hours. CBG: No results for input(s): GLUCAP in the last 168 hours. Time spent: 35 minutes  Signed:  Lynden OxfordEL, Shonica Weier  [facility] 03/29/2017 , 2:26 PM

## 2017-04-02 ENCOUNTER — Ambulatory Visit
Admission: RE | Admit: 2017-04-02 | Discharge: 2017-04-02 | Disposition: A | Discharge status: Home / Self Care | Payer: Medicare Other | Source: Ambulatory Visit | Attending: Family Medicine | Admitting: Family Medicine

## 2017-04-02 ENCOUNTER — Telehealth: Payer: Self-pay | Admitting: Family Medicine

## 2017-04-02 ENCOUNTER — Encounter: Payer: Self-pay | Admitting: Family Medicine

## 2017-04-02 ENCOUNTER — Ambulatory Visit (INDEPENDENT_AMBULATORY_CARE_PROVIDER_SITE_OTHER): Payer: Medicare Other | Admitting: Family Medicine

## 2017-04-02 ENCOUNTER — Telehealth: Payer: Self-pay

## 2017-04-02 VITALS — BP 120/60 | HR 69 | Temp 97.7°F | Resp 16 | Wt 131.0 lb

## 2017-04-02 DIAGNOSIS — M545 Low back pain: Secondary | ICD-10-CM

## 2017-04-02 DIAGNOSIS — R2689 Other abnormalities of gait and mobility: Secondary | ICD-10-CM | POA: Diagnosis not present

## 2017-04-02 DIAGNOSIS — M25552 Pain in left hip: Secondary | ICD-10-CM | POA: Insufficient documentation

## 2017-04-02 DIAGNOSIS — K5790 Diverticulosis of intestine, part unspecified, without perforation or abscess without bleeding: Secondary | ICD-10-CM | POA: Diagnosis not present

## 2017-04-02 DIAGNOSIS — S4992XA Unspecified injury of left shoulder and upper arm, initial encounter: Secondary | ICD-10-CM | POA: Diagnosis not present

## 2017-04-02 DIAGNOSIS — S79912A Unspecified injury of left hip, initial encounter: Secondary | ICD-10-CM | POA: Diagnosis not present

## 2017-04-02 DIAGNOSIS — M25512 Pain in left shoulder: Secondary | ICD-10-CM | POA: Diagnosis not present

## 2017-04-02 DIAGNOSIS — M1990 Unspecified osteoarthritis, unspecified site: Secondary | ICD-10-CM | POA: Diagnosis not present

## 2017-04-02 DIAGNOSIS — H409 Unspecified glaucoma: Secondary | ICD-10-CM | POA: Diagnosis not present

## 2017-04-02 DIAGNOSIS — D62 Acute posthemorrhagic anemia: Secondary | ICD-10-CM | POA: Diagnosis not present

## 2017-04-02 DIAGNOSIS — I10 Essential (primary) hypertension: Secondary | ICD-10-CM | POA: Diagnosis not present

## 2017-04-02 DIAGNOSIS — K922 Gastrointestinal hemorrhage, unspecified: Secondary | ICD-10-CM | POA: Diagnosis not present

## 2017-04-02 DIAGNOSIS — K219 Gastro-esophageal reflux disease without esophagitis: Secondary | ICD-10-CM | POA: Diagnosis not present

## 2017-04-02 DIAGNOSIS — Z9181 History of falling: Secondary | ICD-10-CM | POA: Diagnosis not present

## 2017-04-02 NOTE — Telephone Encounter (Signed)
Spoke with [name], PT specialist at patient;s home, there are some discrepancies with patient's medications. When she was discharged her medication list said to take Aspirin 81 mg but patient is not taking it, should she be? Also Atenolol, Furosemide, Potassium and Meloxicam is not on her medication list but patient has it at home and is taking all of these medications. It looks like these were d/c at discharge from the hospital. Please review and let daughter [name] know and her CB is [phone]

## 2017-04-02 NOTE — Patient Instructions (Signed)
Go [facility] [address] for hip and Animal nutritionistshoudler Xray

## 2017-04-02 NOTE — Telephone Encounter (Signed)
Pt's son called wanting to know what his mom can take for pain with her shoulder and back.  She is taking tramadol.  Is that ok to keep taking.  Call back is [phone]  [facility] HealthhanksTeri

## 2017-04-02 NOTE — Progress Notes (Signed)
Patient: Maria LodgeMargaret L Henshaw Female    DOB: [birth year]   83 y.o.   MRN: [id number] Visit Date: 04/02/2017  Today's Provider: Mila Merryonald Normal Recinos, MD   Chief Complaint  Patient presents with  . Hip Pain    left   Subjective:    Hip Pain        Allergies  Allergen Reactions  . Cephalexin Shortness Of Breath  . Prednisone Nausea And Vomiting  . Sulfa Antibiotics Itching  . Penicillins Rash     Current Outpatient Prescriptions:  .  amLODipine (NORVASC) 2.5 MG tablet, Take 1 tablet (2.5 mg total) by mouth daily., Disp: 90 tablet, Rfl: 1 .  aspirin EC 81 MG tablet, Take 81 mg by mouth daily., Disp: , Rfl:  .  ferrous sulfate 325 (65 FE) MG tablet, Take 1 tablet (325 mg total) by mouth daily., Disp: 30 tablet, Rfl: 3 .  fluticasone (FLONASE) 50 MCG/ACT nasal spray, Place 1 spray into both nostrils daily as needed for allergies. , Disp: , Rfl:  .  lisinopril (PRINIVIL,ZESTRIL) 20 MG tablet, TAKE 1 TABLET BY MOUTH ONCE A DAY - MUST MAKE APPOINTMENT WITH DOCTOR (Patient taking differently: TAKE 1 TABLET BY MOUTH ONCE A DAY), Disp: 90 tablet, Rfl: 3 .  loratadine (CLARITIN) 10 MG tablet, Take 10 mg by mouth daily as needed for allergies. , Disp: , Rfl:  .  omeprazole (PRILOSEC) 40 MG capsule, TAKE ONE (1) CAPSULE EACH DAY, Disp: 30 capsule, Rfl: 12 .  traMADol (ULTRAM) 50 MG tablet, Take 1 tablet (50 mg total) by mouth every 8 (eight) hours as needed. (Patient taking differently: Take 50 mg by mouth every 8 (eight) hours as needed for moderate pain. ), Disp: 30 tablet, Rfl: 0 .  Travoprost, BAK Free, (TRAVATAN Z) 0.004 % SOLN ophthalmic solution, Place 1 drop into both eyes at bedtime. , Disp: , Rfl:   Review of Systems  Constitutional: Negative for appetite change, chills, fatigue and fever.  Respiratory: Negative for chest tightness and shortness of breath.   Cardiovascular: Negative for chest pain and palpitations.  Gastrointestinal: Negative for abdominal pain, nausea and  vomiting.  Neurological: Negative for dizziness and weakness.    Social History  Substance Use Topics  . Smoking status: Never Smoker  . Smokeless tobacco: Never Used  . Alcohol use No   Objective:   There were no vitals taken for this visit. Vitals:   04/02/17 1122  BP: 120/60  Pulse: 69  Resp: 16  Temp: 97.7 F (36.5 C)  TempSrc: Oral  SpO2: 98%  Weight: 131 lb (59.4 kg)     Physical Exam General appearance: alert, well developed, well nourished, cooperative and in no distress Head: Normocephalic, without obvious abnormality, atraumatic Respiratory: Respirations even and unlabored, normal respiratory rate Extremities: Abduction left shoulder limited to 80 degrees. Tender anterior and superior left shoulder. No gross deformities. Tender left lateral and posterior hip. Able to stand without assistance.   Xray left hip and left shoulder shows no acute injury    Assessment & Plan:     1. Hip pain, left  - DG HIP UNILAT WITH PELVIS 2-3 VIEWS LEFT; Future  2. Acute pain of left shoulder  - DG Shoulder Left; Future  Mild contusion of left shoulder and hip. Expect full recover. Advised to use ice on shoudler which she just injured today and heat on side of hip until symptoms resolved.     The entirety of the information documented in  the History of Present Illness, Review of Systems and Physical Exam were personally obtained by me. Portions of this information were initially documented by April M. Hyacinth Meeker, CMA and reviewed by me for thoroughness and accuracy.    Mila Merry, MD  [facility]

## 2017-04-02 NOTE — Telephone Encounter (Signed)
Please advise 

## 2017-04-05 ENCOUNTER — Telehealth: Payer: Self-pay | Admitting: Family Medicine

## 2017-04-05 DIAGNOSIS — K5790 Diverticulosis of intestine, part unspecified, without perforation or abscess without bleeding: Secondary | ICD-10-CM | POA: Diagnosis not present

## 2017-04-05 DIAGNOSIS — R609 Edema, unspecified: Secondary | ICD-10-CM

## 2017-04-05 DIAGNOSIS — D62 Acute posthemorrhagic anemia: Secondary | ICD-10-CM | POA: Diagnosis not present

## 2017-04-05 DIAGNOSIS — K922 Gastrointestinal hemorrhage, unspecified: Secondary | ICD-10-CM | POA: Diagnosis not present

## 2017-04-05 DIAGNOSIS — I10 Essential (primary) hypertension: Secondary | ICD-10-CM | POA: Diagnosis not present

## 2017-04-05 DIAGNOSIS — R2689 Other abnormalities of gait and mobility: Secondary | ICD-10-CM | POA: Diagnosis not present

## 2017-04-05 DIAGNOSIS — M1990 Unspecified osteoarthritis, unspecified site: Secondary | ICD-10-CM | POA: Diagnosis not present

## 2017-04-05 NOTE — Telephone Encounter (Signed)
Daughter advised and will monitor pt's BP. Allene DillonEmily Drozdowski, CMA

## 2017-04-05 NOTE — Telephone Encounter (Signed)
Per Maria Bush, pt was recently D/C from the hospital. Her medication discharge list showed that she is supposed to be taking ASA 81 mg, but she is not taking this medication. The D/C list did NOT have her Furosemide, Potassium, Atenolol or Meloxicam on the list. She is taking all of those medications except the Meloxicam. Is she supposed to be taking these medicines? Maria Bush is requesting for us to advise pt's daughter Maria Bush of the answer, since she is the person who manages pt's medications. Please advise. Allene DillonEmily Drozdowski, CMA

## 2017-04-05 NOTE — Telephone Encounter (Signed)
Ok to continue tramadol for pain. If running out can send in prescription for #30, rf x 3.

## 2017-04-05 NOTE — Telephone Encounter (Signed)
Yes, she can continue taking atenolol, furosemide, and potassium

## 2017-04-05 NOTE — Telephone Encounter (Signed)
Maria Bush with [facility] states she had called on Friday and request a call back to discuss medications.  Pt blood pressure is 92/52.  A call needs to be made to pt daughter, Maria Bush @[phone]/MW  The medication in question are:  Meloxicam  15mg   Furosemide 20mg     Potassium 10MEQ  Atenolol 25mg   Aspirin 81mg 

## 2017-04-06 DIAGNOSIS — K5790 Diverticulosis of intestine, part unspecified, without perforation or abscess without bleeding: Secondary | ICD-10-CM | POA: Diagnosis not present

## 2017-04-06 DIAGNOSIS — D62 Acute posthemorrhagic anemia: Secondary | ICD-10-CM | POA: Diagnosis not present

## 2017-04-06 DIAGNOSIS — I10 Essential (primary) hypertension: Secondary | ICD-10-CM | POA: Diagnosis not present

## 2017-04-06 DIAGNOSIS — M1990 Unspecified osteoarthritis, unspecified site: Secondary | ICD-10-CM | POA: Diagnosis not present

## 2017-04-06 DIAGNOSIS — R2689 Other abnormalities of gait and mobility: Secondary | ICD-10-CM | POA: Diagnosis not present

## 2017-04-06 DIAGNOSIS — K922 Gastrointestinal hemorrhage, unspecified: Secondary | ICD-10-CM | POA: Diagnosis not present

## 2017-04-06 MED ORDER — TRAMADOL HCL 50 MG PO TABS: 50.0000 mg | ORAL_TABLET | Freq: Three times a day (TID) | ORAL | 3 refills | Status: DC | PRN

## 2017-04-06 NOTE — Telephone Encounter (Signed)
Spoke with patient's son and advised as below. [id number]

## 2017-04-09 DIAGNOSIS — I10 Essential (primary) hypertension: Secondary | ICD-10-CM | POA: Diagnosis not present

## 2017-04-09 DIAGNOSIS — D62 Acute posthemorrhagic anemia: Secondary | ICD-10-CM | POA: Diagnosis not present

## 2017-04-09 DIAGNOSIS — R2689 Other abnormalities of gait and mobility: Secondary | ICD-10-CM | POA: Diagnosis not present

## 2017-04-09 DIAGNOSIS — K5790 Diverticulosis of intestine, part unspecified, without perforation or abscess without bleeding: Secondary | ICD-10-CM | POA: Diagnosis not present

## 2017-04-09 DIAGNOSIS — K922 Gastrointestinal hemorrhage, unspecified: Secondary | ICD-10-CM | POA: Diagnosis not present

## 2017-04-09 DIAGNOSIS — M1990 Unspecified osteoarthritis, unspecified site: Secondary | ICD-10-CM | POA: Diagnosis not present

## 2017-04-13 DIAGNOSIS — M1990 Unspecified osteoarthritis, unspecified site: Secondary | ICD-10-CM | POA: Diagnosis not present

## 2017-04-13 DIAGNOSIS — K922 Gastrointestinal hemorrhage, unspecified: Secondary | ICD-10-CM | POA: Diagnosis not present

## 2017-04-13 DIAGNOSIS — D62 Acute posthemorrhagic anemia: Secondary | ICD-10-CM | POA: Diagnosis not present

## 2017-04-13 DIAGNOSIS — K5790 Diverticulosis of intestine, part unspecified, without perforation or abscess without bleeding: Secondary | ICD-10-CM | POA: Diagnosis not present

## 2017-04-13 DIAGNOSIS — R2689 Other abnormalities of gait and mobility: Secondary | ICD-10-CM | POA: Diagnosis not present

## 2017-04-13 DIAGNOSIS — I10 Essential (primary) hypertension: Secondary | ICD-10-CM | POA: Diagnosis not present

## 2017-04-15 ENCOUNTER — Encounter (INDEPENDENT_AMBULATORY_CARE_PROVIDER_SITE_OTHER): Payer: Medicare Other | Admitting: Family Medicine

## 2017-04-15 DIAGNOSIS — K922 Gastrointestinal hemorrhage, unspecified: Secondary | ICD-10-CM | POA: Diagnosis not present

## 2017-04-15 DIAGNOSIS — D62 Acute posthemorrhagic anemia: Secondary | ICD-10-CM

## 2017-04-15 DIAGNOSIS — I10 Essential (primary) hypertension: Secondary | ICD-10-CM

## 2017-04-15 DIAGNOSIS — R2689 Other abnormalities of gait and mobility: Secondary | ICD-10-CM | POA: Diagnosis not present

## 2017-04-16 DIAGNOSIS — K5790 Diverticulosis of intestine, part unspecified, without perforation or abscess without bleeding: Secondary | ICD-10-CM | POA: Diagnosis not present

## 2017-04-16 DIAGNOSIS — R2689 Other abnormalities of gait and mobility: Secondary | ICD-10-CM | POA: Diagnosis not present

## 2017-04-16 DIAGNOSIS — I10 Essential (primary) hypertension: Secondary | ICD-10-CM | POA: Diagnosis not present

## 2017-04-16 DIAGNOSIS — K922 Gastrointestinal hemorrhage, unspecified: Secondary | ICD-10-CM | POA: Diagnosis not present

## 2017-04-16 DIAGNOSIS — M1990 Unspecified osteoarthritis, unspecified site: Secondary | ICD-10-CM | POA: Diagnosis not present

## 2017-04-16 DIAGNOSIS — D62 Acute posthemorrhagic anemia: Secondary | ICD-10-CM | POA: Diagnosis not present

## 2017-04-20 DIAGNOSIS — K5790 Diverticulosis of intestine, part unspecified, without perforation or abscess without bleeding: Secondary | ICD-10-CM | POA: Diagnosis not present

## 2017-04-20 DIAGNOSIS — R2689 Other abnormalities of gait and mobility: Secondary | ICD-10-CM | POA: Diagnosis not present

## 2017-04-20 DIAGNOSIS — K922 Gastrointestinal hemorrhage, unspecified: Secondary | ICD-10-CM | POA: Diagnosis not present

## 2017-04-20 DIAGNOSIS — I10 Essential (primary) hypertension: Secondary | ICD-10-CM | POA: Diagnosis not present

## 2017-04-20 DIAGNOSIS — D62 Acute posthemorrhagic anemia: Secondary | ICD-10-CM | POA: Diagnosis not present

## 2017-04-20 DIAGNOSIS — M1990 Unspecified osteoarthritis, unspecified site: Secondary | ICD-10-CM | POA: Diagnosis not present

## 2017-04-22 DIAGNOSIS — R2689 Other abnormalities of gait and mobility: Secondary | ICD-10-CM | POA: Diagnosis not present

## 2017-04-22 DIAGNOSIS — K922 Gastrointestinal hemorrhage, unspecified: Secondary | ICD-10-CM | POA: Diagnosis not present

## 2017-04-22 DIAGNOSIS — K5790 Diverticulosis of intestine, part unspecified, without perforation or abscess without bleeding: Secondary | ICD-10-CM | POA: Diagnosis not present

## 2017-04-22 DIAGNOSIS — D62 Acute posthemorrhagic anemia: Secondary | ICD-10-CM | POA: Diagnosis not present

## 2017-04-22 DIAGNOSIS — I10 Essential (primary) hypertension: Secondary | ICD-10-CM | POA: Diagnosis not present

## 2017-04-22 DIAGNOSIS — M1990 Unspecified osteoarthritis, unspecified site: Secondary | ICD-10-CM | POA: Diagnosis not present

## 2017-04-26 DIAGNOSIS — M1990 Unspecified osteoarthritis, unspecified site: Secondary | ICD-10-CM | POA: Diagnosis not present

## 2017-04-26 DIAGNOSIS — R2689 Other abnormalities of gait and mobility: Secondary | ICD-10-CM | POA: Diagnosis not present

## 2017-04-26 DIAGNOSIS — D62 Acute posthemorrhagic anemia: Secondary | ICD-10-CM | POA: Diagnosis not present

## 2017-04-26 DIAGNOSIS — K5790 Diverticulosis of intestine, part unspecified, without perforation or abscess without bleeding: Secondary | ICD-10-CM | POA: Diagnosis not present

## 2017-04-26 DIAGNOSIS — I10 Essential (primary) hypertension: Secondary | ICD-10-CM | POA: Diagnosis not present

## 2017-04-26 DIAGNOSIS — K922 Gastrointestinal hemorrhage, unspecified: Secondary | ICD-10-CM | POA: Diagnosis not present

## 2017-04-27 ENCOUNTER — Telehealth: Payer: Self-pay

## 2017-04-27 NOTE — Telephone Encounter (Signed)
If she has never had breast cancer then there is no need to continue mammograms.

## 2017-04-27 NOTE — Telephone Encounter (Signed)
Patient wants to know if she can cancel her appointment with Dr. Evette CristalSankar on 05/03/17 for a mammogram. Please CB Arnetha GulaAngela Lisowski.

## 2017-04-28 NOTE — Telephone Encounter (Signed)
Dr. Sherrie MustacheFisher, She is not actually scheduled for a mammogram.  In his last note Dr Evette CristalSankar said that he recommended d/c mammogram, just follow with exam and if anything should arise then determin if mammogram should be done.  Should she still cancel with him since it was just for the exam?

## 2017-04-28 NOTE — Telephone Encounter (Signed)
I don't think it is necessary to follow up with sankar

## 2017-04-29 NOTE — Telephone Encounter (Signed)
Patient's daughter Marylene Landngela was notified.

## 2017-04-30 ENCOUNTER — Ambulatory Visit: Payer: Medicare Other

## 2017-04-30 ENCOUNTER — Telehealth: Payer: Self-pay | Admitting: Family Medicine

## 2017-04-30 DIAGNOSIS — I10 Essential (primary) hypertension: Secondary | ICD-10-CM | POA: Diagnosis not present

## 2017-04-30 DIAGNOSIS — M1990 Unspecified osteoarthritis, unspecified site: Secondary | ICD-10-CM | POA: Diagnosis not present

## 2017-04-30 DIAGNOSIS — R2689 Other abnormalities of gait and mobility: Secondary | ICD-10-CM | POA: Diagnosis not present

## 2017-04-30 DIAGNOSIS — D62 Acute posthemorrhagic anemia: Secondary | ICD-10-CM | POA: Diagnosis not present

## 2017-04-30 DIAGNOSIS — K5790 Diverticulosis of intestine, part unspecified, without perforation or abscess without bleeding: Secondary | ICD-10-CM | POA: Diagnosis not present

## 2017-04-30 DIAGNOSIS — K922 Gastrointestinal hemorrhage, unspecified: Secondary | ICD-10-CM | POA: Diagnosis not present

## 2017-04-30 NOTE — Telephone Encounter (Signed)
Patient's daughter Marylene Landngela was notified. Expressed understanding.

## 2017-04-30 NOTE — Telephone Encounter (Signed)
Please advise 

## 2017-04-30 NOTE — Telephone Encounter (Signed)
If she has any light headedness of dizziness then she needs to go the emergency room. If not then stop taking the amlodipine for the time being. Home health needs to recheck blood pressure early next week.

## 2017-04-30 NOTE — Telephone Encounter (Signed)
Larita FifeLynn with [facility] called to advise pt blood pressure was 70/50.  Pt has just recently taken her medication and no other symptoms.  Larita FifeLynn request a call made to the daughter, Marylene Landngela CB#[phone]/MW

## 2017-05-03 ENCOUNTER — Encounter: Payer: Self-pay | Admitting: Family Medicine

## 2017-05-03 ENCOUNTER — Ambulatory Visit: Payer: Medicare Other | Admitting: General Surgery

## 2017-05-03 ENCOUNTER — Ambulatory Visit (INDEPENDENT_AMBULATORY_CARE_PROVIDER_SITE_OTHER): Payer: Medicare Other | Admitting: Family Medicine

## 2017-05-03 VITALS — BP 110/64 | HR 68 | Temp 97.9°F | Resp 16 | Wt 122.0 lb

## 2017-05-03 DIAGNOSIS — D500 Iron deficiency anemia secondary to blood loss (chronic): Secondary | ICD-10-CM

## 2017-05-03 DIAGNOSIS — K5730 Diverticulosis of large intestine without perforation or abscess without bleeding: Secondary | ICD-10-CM | POA: Diagnosis not present

## 2017-05-03 DIAGNOSIS — E538 Deficiency of other specified B group vitamins: Secondary | ICD-10-CM | POA: Diagnosis not present

## 2017-05-03 DIAGNOSIS — I10 Essential (primary) hypertension: Secondary | ICD-10-CM | POA: Diagnosis not present

## 2017-05-03 NOTE — Progress Notes (Signed)
Patient: Maria Bush Female    DOB: [birth year]   83 y.o.   MRN: [id number] Visit Date: 05/03/2017  Today's Provider: Mila Merry, MD   Chief Complaint  Patient presents with  . Follow-up  . Hypertension  . Anemia   Subjective:    HPI   Follow up abnormal bleeding She presented to ER originally for abnormal bleeding. This was thought to be rectal bleeding when she was admitted, but patient states today that she was bleeding from vagina, not from rectum. She underwent tagged red blood cell scan 03/26/2017, revealing acute gastrointestinal bleeding in the mid abdomen. She was transferred to [facility] where she had capsule endoscopy 03/27/2017 finding no acute bleeding. her hemoglobin remained stable and she was discharged hom on 03/29/2017. I could not find a records of patient having had visual inspection of rectum or vagina and I could not find any stool testing for blood in her records. She is noted to have had diverticulosis of sigmoid colon on colonoscopy 08/16/2006 (Dr. Servando Snare)   Hypertension, follow-up:  BP Readings from Last 3 Encounters:  05/03/17 110/64  04/02/17 120/60  03/30/17 127/73    She was last seen for hypertension 1 years ago.  BP at that visit was 130/78. Home health called last week reporting home BP was 70/50 and she was advised to stop amlodipine She is not having side effects. none She is not exercising. She is adherent to low salt diet.   Outside blood pressures are running low-amlodipine was put on hold 04/30/2017 due to low Bp. She is experiencing none.  Patient denies none.   Cardiovascular risk factors include none.  Use of agents associated with hypertension: none.   ----------------------------------------------------------------     Allergies  Allergen Reactions  . Cephalexin Shortness Of Breath  . Prednisone Nausea And Vomiting  . Sulfa Antibiotics Itching  . Penicillins Rash     Current Outpatient Prescriptions:  .   amLODipine (NORVASC) 2.5 MG tablet, Take 1 tablet (2.5 mg total) by mouth daily., Disp: 90 tablet, Rfl: 1 .  aspirin EC 81 MG tablet, Take 81 mg by mouth daily., Disp: , Rfl:  .  ferrous sulfate 325 (65 FE) MG tablet, Take 1 tablet (325 mg total) by mouth daily., Disp: 30 tablet, Rfl: 3 .  fluticasone (FLONASE) 50 MCG/ACT nasal spray, Place 1 spray into both nostrils daily as needed for allergies. , Disp: , Rfl:  .  lisinopril (PRINIVIL,ZESTRIL) 20 MG tablet, TAKE 1 TABLET BY MOUTH ONCE A DAY - MUST MAKE APPOINTMENT WITH DOCTOR (Patient taking differently: TAKE 1 TABLET BY MOUTH ONCE A DAY), Disp: 90 tablet, Rfl: 3 .  loratadine (CLARITIN) 10 MG tablet, Take 10 mg by mouth daily as needed for allergies. , Disp: , Rfl:  .  omeprazole (PRILOSEC) 40 MG capsule, TAKE ONE (1) CAPSULE EACH DAY, Disp: 30 capsule, Rfl: 12 .  Travoprost, BAK Free, (TRAVATAN Z) 0.004 % SOLN ophthalmic solution, Place 1 drop into both eyes at bedtime. , Disp: , Rfl:  .  traMADol (ULTRAM) 50 MG tablet, Take 1 tablet (50 mg total) by mouth every 8 (eight) hours as needed. (Patient not taking: Reported on 05/03/2017), Disp: 30 tablet, Rfl: 3  Review of Systems  Constitutional: Negative for appetite change, chills, fatigue and fever.  Respiratory: Negative for chest tightness and shortness of breath.   Cardiovascular: Negative for chest pain and palpitations.  Gastrointestinal: Negative for abdominal pain, nausea and vomiting.  Neurological: Negative  for dizziness and weakness.    Social History  Substance Use Topics  . Smoking status: Never Smoker  . Smokeless tobacco: Never Used  . Alcohol use No   Objective:   BP 110/64 (BP Location: Right Arm, Patient Position: Sitting, Cuff Size: Normal)   Pulse 68   Temp 97.9 F (36.6 C) (Oral)   Resp 16   Wt 122 lb (55.3 kg)   SpO2 99%   BMI 22.31 kg/m  Vitals:   05/03/17 1143  BP: 110/64  Pulse: 68  Resp: 16  Temp: 97.9 F (36.6 C)  TempSrc: Oral  SpO2: 99%    Weight: 122 lb (55.3 kg)     Physical Exam   General Appearance:    Alert, cooperative, no distress  Eyes:    PERRL, conjunctiva/corneas clear, EOM's intact       Lungs:     Clear to auscultation bilaterally, respirations unlabored  Heart:    Regular rate and rhythm  Neurologic:   Awake, alert, oriented x 3. No apparent focal neurological           defect.   GI:  No rectal or perirectal lesions.   GU:    No lesions of vestibule or vaginal walls by visual inspection       Assessment & Plan:     1. Iron deficiency anemia due to chronic blood loss Patient states today she thinks bleeding was from vaginal, but she is s/p hysterectomy and there are no abnormal lesions on visual exam. I suspect she may have had diverticular bleed. There is currently no sign of active bleeding and patient is back to her baseline.  - CBC - Vitamin B12  2. Essential hypertension Had been hypotensive until amlodipine was put on hold last week. Will discontinue and continue other medications for no.   3. Diverticulosis of large intestine without hemorrhage May have been cause of bleeding that led to hospitalization in may, but is asymptomatic today.        Mila Merryonald Kimia Finan, MD  [facility]

## 2017-05-04 LAB — CBC
Hematocrit: 29.5 % — ABNORMAL LOW (ref 34.0–46.6)
Hemoglobin: 9.8 g/dL — ABNORMAL LOW (ref 11.1–15.9)
MCH: 32 pg (ref 26.6–33.0)
MCHC: 33.2 g/dL (ref 31.5–35.7)
MCV: 96 fL (ref 79–97)
PLATELETS: 342 10*3/uL (ref 150–379)
RBC: 3.06 x10E6/uL — ABNORMAL LOW (ref 3.77–5.28)
RDW: 13.6 % (ref 12.3–15.4)
WBC: 6.1 10*3/uL (ref 3.4–10.8)

## 2017-05-04 LAB — VITAMIN B12: Vitamin B-12: 554 pg/mL (ref 232–1245)

## 2017-05-06 DIAGNOSIS — M1711 Unilateral primary osteoarthritis, right knee: Secondary | ICD-10-CM | POA: Diagnosis not present

## 2017-05-06 DIAGNOSIS — M25561 Pain in right knee: Secondary | ICD-10-CM | POA: Diagnosis not present

## 2017-05-13 DIAGNOSIS — M1711 Unilateral primary osteoarthritis, right knee: Secondary | ICD-10-CM | POA: Diagnosis not present

## 2017-05-20 DIAGNOSIS — M1711 Unilateral primary osteoarthritis, right knee: Secondary | ICD-10-CM | POA: Diagnosis not present

## 2017-05-29 ENCOUNTER — Other Ambulatory Visit: Payer: Self-pay | Admitting: Family Medicine

## 2017-06-03 ENCOUNTER — Ambulatory Visit (INDEPENDENT_AMBULATORY_CARE_PROVIDER_SITE_OTHER): Payer: Medicare Other

## 2017-06-03 VITALS — BP 102/60 | HR 60 | Temp 98.1°F | Ht 62.0 in | Wt 125.4 lb

## 2017-06-03 DIAGNOSIS — Z0000 Encounter for general adult medical examination without abnormal findings: Secondary | ICD-10-CM

## 2017-06-03 NOTE — Patient Instructions (Signed)
Maria Bush , Thank you for taking time to come for your Medicare Wellness Visit. I appreciate your ongoing commitment to your health goals. Please review the following plan we discussed and let me know if I can assist you in the future.   Screening recommendations/referrals: Colonoscopy: completed 08/16/06 Mammogram: completed 02/13/16 Bone Density: completed 02/06/13 Recommended yearly ophthalmology/optometry visit for glaucoma screening and checkup Recommended yearly dental visit for hygiene and checkup  Vaccinations: Influenza vaccine: due 07/2017 Pneumococcal vaccine: completed series Tdap vaccine: completed 07/22/07, due 07/2017 Shingles vaccine: declined  Advanced directives: Please bring a copy of your POA (Power of [place]) and/or Living Will to your next appointment.   Conditions/risks identified: Fall risk prevention; Recommend increasing water intake to 4-6 glasses of water a day.   Next appointment: None, need to schedule follow up with PCP and 1 year AWV.   Preventive Care 5065 Years and Older, Female Preventive care refers to lifestyle choices and visits with your health care provider that can promote health and wellness. What does preventive care include?  A yearly physical exam. This is also called an annual well check.  Dental exams once or twice a year.  Routine eye exams. Ask your health care provider how often you should have your eyes checked.  Personal lifestyle choices, including:  Daily care of your teeth and gums.  Regular physical activity.  Eating a healthy diet.  Avoiding tobacco and drug use.  Limiting alcohol use.  Practicing safe sex.  Taking low-dose aspirin every day.  Taking vitamin and mineral supplements as recommended by your health care provider. What happens during an annual well check? The services and screenings done by your health care provider during your annual well check will depend on your age, overall health, lifestyle  risk factors, and family history of disease. Counseling  Your health care provider may ask you questions about your:  Alcohol use.  Tobacco use.  Drug use.  Emotional well-being.  Home and relationship well-being.  Sexual activity.  Eating habits.  History of falls.  Memory and ability to understand (cognition).  Work and work Astronomerenvironment.  Reproductive health. Screening  You may have the following tests or measurements:  Height, weight, and BMI.  Blood pressure.  Lipid and cholesterol levels. These may be checked every 5 years, or more frequently if you are over 81 years old.  Skin check.  Lung cancer screening. You may have this screening every year starting at age 81 if you have a 30-pack-year history of smoking and currently smoke or have quit within the past 15 years.  Fecal occult blood test (FOBT) of the stool. You may have this test every year starting at age 350.  Flexible sigmoidoscopy or colonoscopy. You may have a sigmoidoscopy every 5 years or a colonoscopy every 10 years starting at age 81.  Hepatitis C blood test.  Hepatitis B blood test.  Sexually transmitted disease (STD) testing.  Diabetes screening. This is done by checking your blood sugar (glucose) after you have not eaten for a while (fasting). You may have this done every 1-3 years.  Bone density scan. This is done to screen for osteoporosis. You may have this done starting at age 81.  Mammogram. This may be done every 1-2 years. Talk to your health care provider about how often you should have regular mammograms. Talk with your health care provider about your test results, treatment options, and if necessary, the need for more tests. Vaccines  Your health care provider may  recommend certain vaccines, such as:  Influenza vaccine. This is recommended every year.  Tetanus, diphtheria, and acellular pertussis (Tdap, Td) vaccine. You may need a Td booster every 10 years.  Zoster vaccine.  You may need this after age 25.  Pneumococcal 13-valent conjugate (PCV13) vaccine. One dose is recommended after age 17.  Pneumococcal polysaccharide (PPSV23) vaccine. One dose is recommended after age 63. Talk to your health care provider about which screenings and vaccines you need and how often you need them. This information is not intended to replace advice given to you by your health care provider. Make sure you discuss any questions you have with your health care provider. Document Released: 11/29/2015 Document Revised: 07/22/2016 Document Reviewed: 09/03/2015 Elsevier Interactive Patient Education  2017 [place] Prevention in the Home Falls can cause injuries. They can happen to people of all ages. There are many things you can do to make your home safe and to help prevent falls. What can I do on the outside of my home?  Regularly fix the edges of walkways and driveways and fix any cracks.  Remove anything that might make you trip as you walk through a door, such as a raised step or threshold.  Trim any bushes or trees on the path to your home.  Use bright outdoor lighting.  Clear any walking paths of anything that might make someone trip, such as rocks or tools.  Regularly check to see if handrails are loose or broken. Make sure that both sides of any steps have handrails.  Any raised decks and porches should have guardrails on the edges.  Have any leaves, snow, or ice cleared regularly.  Use sand or salt on walking paths during winter.  Clean up any spills in your garage right away. This includes oil or grease spills. What can I do in the bathroom?  Use night lights.  Install grab bars by the toilet and in the tub and shower. Do not use towel bars as grab bars.  Use non-skid mats or decals in the tub or shower.  If you need to sit down in the shower, use a plastic, non-slip stool.  Keep the floor dry. Clean up any water that spills on the floor as soon  as it happens.  Remove soap buildup in the tub or shower regularly.  Attach bath mats securely with double-sided non-slip rug tape.  Do not have throw rugs and other things on the floor that can make you trip. What can I do in the bedroom?  Use night lights.  Make sure that you have a light by your bed that is easy to reach.  Do not use any sheets or blankets that are too big for your bed. They should not hang down onto the floor.  Have a firm chair that has side arms. You can use this for support while you get dressed.  Do not have throw rugs and other things on the floor that can make you trip. What can I do in the kitchen?  Clean up any spills right away.  Avoid walking on wet floors.  Keep items that you use a lot in easy-to-reach places.  If you need to reach something above you, use a strong step stool that has a grab bar.  Keep electrical cords out of the way.  Do not use floor polish or wax that makes floors slippery. If you must use wax, use non-skid floor wax.  Do not have throw rugs and  other things on the floor that can make you trip. What can I do with my stairs?  Do not leave any items on the stairs.  Make sure that there are handrails on both sides of the stairs and use them. Fix handrails that are broken or loose. Make sure that handrails are as long as the stairways.  Check any carpeting to make sure that it is firmly attached to the stairs. Fix any carpet that is loose or worn.  Avoid having throw rugs at the top or bottom of the stairs. If you do have throw rugs, attach them to the floor with carpet tape.  Make sure that you have a light switch at the top of the stairs and the bottom of the stairs. If you do not have them, ask someone to add them for you. What else can I do to help prevent falls?  Wear shoes that:  Do not have high heels.  Have rubber bottoms.  Are comfortable and fit you well.  Are closed at the toe. Do not wear sandals.  If  you use a stepladder:  Make sure that it is fully opened. Do not climb a closed stepladder.  Make sure that both sides of the stepladder are locked into place.  Ask someone to hold it for you, if possible.  Clearly mark and make sure that you can see:  Any grab bars or handrails.  First and last steps.  Where the edge of each step is.  Use tools that help you move around (mobility aids) if they are needed. These include:  Canes.  Walkers.  Scooters.  Crutches.  Turn on the lights when you go into a dark area. Replace any light bulbs as soon as they burn out.  Set up your furniture so you have a clear path. Avoid moving your furniture around.  If any of your floors are uneven, fix them.  If there are any pets around you, be aware of where they are.  Review your medicines with your doctor. Some medicines can make you feel dizzy. This can increase your chance of falling. Ask your doctor what other things that you can do to help prevent falls. This information is not intended to replace advice given to you by your health care provider. Make sure you discuss any questions you have with your health care provider. Document Released: 08/29/2009 Document Revised: 04/09/2016 Document Reviewed: 12/07/2014 Elsevier Interactive Patient Education  2017 [employer].

## 2017-06-03 NOTE — Progress Notes (Signed)
Subjective:   Maria Bush is a 83 y.o. female who presents for Medicare Annual (Subsequent) preventive examination.  Review of Systems:  N/A  Cardiac Risk Factors include: advanced age (>34men, >68 women);hypertension;dyslipidemia     Objective:     Vitals: BP 102/60 (BP Location: Left Arm)   Pulse 60   Temp 98.1 F (36.7 C) (Oral)   Ht 5\' 2"  (1.575 m)   Wt 125 lb 6.4 oz (56.9 kg)   BMI 22.94 kg/m   Body mass index is 22.94 kg/m.   Tobacco History  Smoking Status  . Never Smoker  Smokeless Tobacco  . Never Used     Counseling given: Not Answered   Past Medical History:  Diagnosis Date  . Acute blood loss anemia   . Arthritis 2005  . Diverticulosis   . GI bleed   . Heart murmur 2005  . Hypertension    Past Surgical History:  Procedure Laterality Date  . ABDOMINAL HYSTERECTOMY  1981  . BLADDER SURGERY  1992  . BREAST BIOPSY  1992  . CARDIAC CATHETERIZATION  2011  . CATARACT EXTRACTION Bilateral 2015  . CHOLECYSTECTOMY  08/09/2000  . COLONOSCOPY  1982  . DILATION AND CURETTAGE OF UTERUS  1979  . GIVENS CAPSULE STUDY N/A 03/27/2017   Procedure: GIVENS CAPSULE STUDY;  Surgeon: Bernette Redbird, MD;  Location: [facility] ENDOSCOPY;  Service: Endoscopy;  Laterality: N/A;  . RECTOCELE REPAIR  1994   Family History  Problem Relation Age of Onset  . Breast cancer Sister   . Early death Mother   . Stroke Father   . Heart disease Brother   . Heart attack Brother   . Heart disease Brother   . Heart attack Brother    History  Sexual Activity  . Sexual activity: Not on file    Outpatient Encounter Prescriptions as of 06/03/2017  Medication Sig  . aspirin EC 81 MG tablet Take 81 mg by mouth daily.  Marland Kitchen atenolol (TENORMIN) 25 MG tablet Take 25 mg by mouth daily.  . Calcium Carbonate-Vitamin D3 600-400 MG-UNIT TABS Take 1 tablet by mouth daily.  . ferrous sulfate 325 (65 FE) MG tablet Take 1 tablet (325 mg total) by mouth daily.  . furosemide (LASIX) 20 MG  tablet TAKE ONE (1) TABLET BY MOUTH EVERY DAY  . latanoprost (XALATAN) 0.005 % ophthalmic solution Place 1 drop into both eyes at bedtime.  Marland Kitchen lisinopril (PRINIVIL,ZESTRIL) 20 MG tablet TAKE 1 TABLET BY MOUTH ONCE A DAY - MUST MAKE APPOINTMENT WITH DOCTOR (Patient taking differently: TAKE 1 TABLET BY MOUTH ONCE A DAY)  . omeprazole (PRILOSEC) 40 MG capsule TAKE ONE (1) CAPSULE EACH DAY  . potassium chloride (K-DUR) 10 MEQ tablet Take 10 mEq by mouth daily.  . traMADol (ULTRAM) 50 MG tablet Take 1 tablet (50 mg total) by mouth every 8 (eight) hours as needed.  . Travoprost, BAK Free, (TRAVATAN Z) 0.004 % SOLN ophthalmic solution Place 1 drop into both eyes at bedtime.   . [DISCONTINUED] fluticasone (FLONASE) 50 MCG/ACT nasal spray Place 1 spray into both nostrils daily as needed for allergies.   . [DISCONTINUED] loratadine (CLARITIN) 10 MG tablet Take 10 mg by mouth daily as needed for allergies.    No facility-administered encounter medications on file as of 06/03/2017.     Activities of Daily Living In your present state of health, do you have any difficulty performing the following activities: 06/03/2017 03/26/2017  Hearing? N N  Vision? N N  Difficulty concentrating or making decisions? Malvin JohnsY Y  Walking or climbing stairs? Y N  Dressing or bathing? N N  Doing errands, shopping? Y N  Preparing Food and eating ? N -  Using the Toilet? N -  In the past six months, have you accidently leaked urine? N -  Do you have problems with loss of bowel control? N -  Managing your Medications? N -  Managing your Finances? N -  Housekeeping or managing your Housekeeping? Y -  Some recent data might be hidden    Patient Care Team: Malva LimesFisher, Donald E, MD as PCP - General (Family Medicine)    Assessment:     Exercise Activities and Dietary recommendations Current Exercise Habits: The patient does not participate in regular exercise at present, Exercise limited by: None identified  Goals    . Increase  water intake          Recommend increasing water intake to 4-6 glasses of water a day.       Fall Risk Fall Risk  06/03/2017 01/24/2016 04/25/2015  Falls in the past year? Yes Yes Yes  Number falls in past yr: 1 2 or more 2 or more  Injury with Fall? No No No  Risk Factor Category  - - High Fall Risk  Follow up Falls prevention discussed - -   Depression Screen PHQ 2/9 Scores 06/03/2017 06/03/2017 01/24/2016 04/25/2015  PHQ - 2 Score 0 0 0 0  PHQ- 9 Score 2 - - -     Cognitive Function     6CIT Screen 06/03/2017  What Year? 0 points  What month? 0 points  What time? 0 points  Count back from 20 2 points  Months in reverse 4 points  Repeat phrase 10 points  Total Score 16    Immunization History  Administered Date(s) Administered  . Influenza, High Dose Seasonal PF 09/24/2015  . Pneumococcal Conjugate-13 09/24/2015  . Pneumococcal Polysaccharide-23 09/29/2004  . Td 07/22/2007   Screening Tests Health Maintenance  Topic Date Due  . INFLUENZA VACCINE  06/16/2017  . TETANUS/TDAP  07/21/2017  . DEXA SCAN  Completed  . PNA vac Low Risk Adult  Completed      Plan:  I have personally reviewed and addressed the Medicare Annual Wellness questionnaire and have noted the following in the patient's chart:  A. Medical and social history B. Use of alcohol, tobacco or illicit drugs  C. Current medications and supplements D. Functional ability and status E.  Nutritional status F.  Physical activity G. Advance directives H. List of other physicians I.  Hospitalizations, surgeries, and ER visits in previous 12 months J.  Vitals K. Screenings such as hearing and vision if needed, cognitive and depression L. Referrals and appointments - none  In addition, I have reviewed and discussed with patient certain preventive protocols, quality metrics, and best practice recommendations. A written personalized care plan for preventive services as well as general preventive health  recommendations were provided to patient.  See attached scanned questionnaire for additional information.   Signed,  Hyacinth MeekerMckenzie Zissy Hamlett, LPN Nurse Health Advisor   MD Recommendations: None.

## 2017-06-05 ENCOUNTER — Other Ambulatory Visit: Payer: Self-pay | Admitting: Family Medicine

## 2017-07-27 DIAGNOSIS — M1711 Unilateral primary osteoarthritis, right knee: Secondary | ICD-10-CM | POA: Diagnosis not present

## 2017-09-10 ENCOUNTER — Encounter: Payer: Self-pay | Admitting: Family Medicine

## 2017-09-10 ENCOUNTER — Ambulatory Visit: Payer: Medicare Other | Admitting: Family Medicine

## 2017-09-10 ENCOUNTER — Ambulatory Visit (INDEPENDENT_AMBULATORY_CARE_PROVIDER_SITE_OTHER): Payer: Medicare Other | Admitting: Family Medicine

## 2017-09-10 VITALS — BP 138/68 | HR 68 | Temp 98.3°F | Resp 16

## 2017-09-10 DIAGNOSIS — R350 Frequency of micturition: Secondary | ICD-10-CM | POA: Diagnosis not present

## 2017-09-10 DIAGNOSIS — N390 Urinary tract infection, site not specified: Secondary | ICD-10-CM | POA: Diagnosis not present

## 2017-09-10 LAB — POCT URINALYSIS DIPSTICK
Bilirubin, UA: NEGATIVE
GLUCOSE UA: NEGATIVE
Ketones, UA: NEGATIVE
NITRITE UA: NEGATIVE
PH UA: 6.5 (ref 5.0–8.0)
PROTEIN UA: NEGATIVE
Spec Grav, UA: 1.01 (ref 1.010–1.025)
UROBILINOGEN UA: 0.2 U/dL

## 2017-09-10 MED ORDER — CIPROFLOXACIN HCL 500 MG PO TABS: 500.0000 mg | ORAL_TABLET | Freq: Two times a day (BID) | ORAL | 0 refills | Status: AC

## 2017-09-10 NOTE — Progress Notes (Signed)
Patient: Maria Bush Female    DOB: 10-Nov-1930   83 y.o.   MRN: [id number] Visit Date: 09/10/2017  Today's Provider: Mila Merryonald Ellenora Talton, MD   Chief Complaint  Patient presents with  . Urinary Frequency   Subjective:    Urinary Frequency   This is a recurrent problem. Episode onset: 5 months ago. The problem has been unchanged. The patient is experiencing no pain. There has been no fever. Associated symptoms include chills, frequency and urgency. Pertinent negatives include no nausea or vomiting. Associated symptoms comments: Also urinary incontinence. She has tried nothing for the symptoms.       Allergies  Allergen Reactions  . Cephalexin Shortness Of Breath  . Prednisone Nausea And Vomiting  . Sulfa Antibiotics Itching  . Penicillins Rash     Current Outpatient Prescriptions:  .  aspirin EC 81 MG tablet, Take 81 mg by mouth daily., Disp: , Rfl:  .  atenolol (TENORMIN) 25 MG tablet, TAKE ONE (1) TABLET BY MOUTH EVERY DAY, Disp: 90 tablet, Rfl: 4 .  Calcium Carbonate-Vitamin D3 600-400 MG-UNIT TABS, Take 1 tablet by mouth daily., Disp: , Rfl:  .  ferrous sulfate 325 (65 FE) MG tablet, Take 1 tablet (325 mg total) by mouth daily., Disp: 30 tablet, Rfl: 3 .  furosemide (LASIX) 20 MG tablet, TAKE ONE (1) TABLET BY MOUTH EVERY DAY, Disp: 90 tablet, Rfl: 4 .  latanoprost (XALATAN) 0.005 % ophthalmic solution, Place 1 drop into both eyes at bedtime., Disp: , Rfl:  .  lisinopril (PRINIVIL,ZESTRIL) 20 MG tablet, TAKE 1 TABLET BY MOUTH ONCE A DAY - MUST MAKE APPOINTMENT WITH DOCTOR (Patient taking differently: TAKE 1 TABLET BY MOUTH ONCE A DAY), Disp: 90 tablet, Rfl: 3 .  omeprazole (PRILOSEC) 40 MG capsule, TAKE ONE (1) CAPSULE EACH DAY, Disp: 30 capsule, Rfl: 12 .  potassium chloride (K-DUR) 10 MEQ tablet, Take 10 mEq by mouth daily., Disp: , Rfl:  .  traMADol (ULTRAM) 50 MG tablet, Take 1 tablet (50 mg total) by mouth every 8 (eight) hours as needed., Disp: 30 tablet, Rfl:  3 .  Travoprost, BAK Free, (TRAVATAN Z) 0.004 % SOLN ophthalmic solution, Place 1 drop into both eyes at bedtime. , Disp: , Rfl:   Review of Systems  Constitutional: Positive for chills. Negative for appetite change, fatigue and fever.  Respiratory: Negative for chest tightness and shortness of breath.   Cardiovascular: Negative for chest pain and palpitations.  Gastrointestinal: Negative for abdominal pain, nausea and vomiting.  Genitourinary: Positive for frequency and urgency.  Neurological: Negative for dizziness and weakness.    Social History  Substance Use Topics  . Smoking status: Never Smoker  . Smokeless tobacco: Never Used  . Alcohol use No   Objective:   BP 138/68 (BP Location: Left Arm, Patient Position: Sitting, Cuff Size: Normal)   Pulse 68   Temp 98.3 F (36.8 C) (Oral)   Resp 16   SpO2 95%  There were no vitals filed for this visit.   Physical Exam   General Appearance:    Alert, cooperative, no distress  Eyes:    PERRL, conjunctiva/corneas clear, EOM's intact       Lungs:     Clear to auscultation bilaterally, respirations unlabored  Heart:    Regular rate and rhythm  Neurologic:   Awake, alert, oriented x 3. No apparent focal neurological           defect.   MS:   No  CVA tenderness.     Results for orders placed or performed in visit on 09/10/17  POCT Urinalysis Dipstick  Result Value Ref Range   Color, UA yellow    Clarity, UA clear    Glucose, UA negative    Bilirubin, UA negative    Ketones, UA negative    Spec Grav, UA 1.010 1.010 - 1.025   Blood, UA Trace (Non-hemolyzed)    pH, UA 6.5 5.0 - 8.0   Protein, UA negative    Urobilinogen, UA 0.2 0.2 or 1.0 E.U./dL   Nitrite, UA negative    Leukocytes, UA Moderate (2+) (A) Negative       Assessment & Plan:     1. Urinary frequency  - POCT Urinalysis Dipstick  2. Urinary tract infection without hematuria, site unspecified  - Urine Culture; Future - Urine Culture - ciprofloxacin  (CIPRO) 500 MG tablet; Take 1 tablet (500 mg total) by mouth 2 (two) times daily.  Dispense: 14 tablet; Refill: 0       Mila Merry, MD  [facility]

## 2017-09-11 LAB — URINE CULTURE
MICRO NUMBER:: 81203869
SPECIMEN QUALITY: ADEQUATE

## 2017-09-25 ENCOUNTER — Ambulatory Visit (INDEPENDENT_AMBULATORY_CARE_PROVIDER_SITE_OTHER): Payer: Medicare Other

## 2017-09-25 DIAGNOSIS — Z23 Encounter for immunization: Secondary | ICD-10-CM | POA: Diagnosis not present

## 2017-10-05 ENCOUNTER — Encounter: Payer: Self-pay | Admitting: Family Medicine

## 2017-10-05 ENCOUNTER — Ambulatory Visit (INDEPENDENT_AMBULATORY_CARE_PROVIDER_SITE_OTHER): Payer: Medicare Other | Admitting: Family Medicine

## 2017-10-05 VITALS — BP 148/80 | HR 58 | Temp 98.3°F | Resp 26 | Wt 126.0 lb

## 2017-10-05 DIAGNOSIS — H6122 Impacted cerumen, left ear: Secondary | ICD-10-CM | POA: Diagnosis not present

## 2017-10-05 NOTE — Progress Notes (Signed)
Patient: Maria Bush Female    DOB: 1930/04/22   83 y.o.   MRN: [id number] Visit Date: 10/05/2017  Today's Provider: Mila Merryonald Fisher, MD   Chief Complaint  Patient presents with  . Ear Fullness   Subjective:    Patient has bilateral ear fullness. Patient states her hear has diminished recently and her ears feel stopped-up.        Allergies  Allergen Reactions  . Cephalexin Shortness Of Breath  . Prednisone Nausea And Vomiting  . Sulfa Antibiotics Itching  . Penicillins Rash     Current Outpatient Medications:  .  aspirin EC 81 MG tablet, Take 81 mg by mouth daily., Disp: , Rfl:  .  atenolol (TENORMIN) 25 MG tablet, TAKE ONE (1) TABLET BY MOUTH EVERY DAY, Disp: 90 tablet, Rfl: 4 .  Calcium Carbonate-Vitamin D3 600-400 MG-UNIT TABS, Take 1 tablet by mouth daily., Disp: , Rfl:  .  ferrous sulfate 325 (65 FE) MG tablet, Take 1 tablet (325 mg total) by mouth daily., Disp: 30 tablet, Rfl: 3 .  furosemide (LASIX) 20 MG tablet, TAKE ONE (1) TABLET BY MOUTH EVERY DAY, Disp: 90 tablet, Rfl: 4 .  latanoprost (XALATAN) 0.005 % ophthalmic solution, Place 1 drop into both eyes at bedtime., Disp: , Rfl:  .  lisinopril (PRINIVIL,ZESTRIL) 20 MG tablet, TAKE 1 TABLET BY MOUTH ONCE A DAY - MUST MAKE APPOINTMENT WITH DOCTOR (Patient taking differently: TAKE 1 TABLET BY MOUTH ONCE A DAY), Disp: 90 tablet, Rfl: 3 .  omeprazole (PRILOSEC) 40 MG capsule, TAKE ONE (1) CAPSULE EACH DAY, Disp: 30 capsule, Rfl: 12 .  potassium chloride (K-DUR) 10 MEQ tablet, Take 10 mEq by mouth daily., Disp: , Rfl:  .  traMADol (ULTRAM) 50 MG tablet, Take 1 tablet (50 mg total) by mouth every 8 (eight) hours as needed., Disp: 30 tablet, Rfl: 3 .  Travoprost, BAK Free, (TRAVATAN Z) 0.004 % SOLN ophthalmic solution, Place 1 drop into both eyes at bedtime. , Disp: , Rfl:   Review of Systems  Constitutional: Negative for appetite change, chills, fatigue and fever.  Respiratory: Negative for chest tightness  and shortness of breath.   Cardiovascular: Negative for chest pain and palpitations.  Gastrointestinal: Negative for nausea.  Neurological: Negative for dizziness and weakness.    Social History   Tobacco Use  . Smoking status: Never Smoker  . Smokeless tobacco: Never Used  Substance Use Topics  . Alcohol use: No   Objective:   BP (!) 148/80 (BP Location: Right Arm, Patient Position: Sitting, Cuff Size: Normal)   Pulse (!) 58   Temp 98.3 F (36.8 C) (Oral)   Resp (!) 26   Wt 126 lb (57.2 kg)   SpO2 94%   BMI 23.05 kg/m  Vitals:   10/05/17 1450  BP: (!) 148/80  Pulse: (!) 58  Resp: (!) 26  Temp: 98.3 F (36.8 C)  TempSrc: Oral  SpO2: 94%  Weight: 126 lb (57.2 kg)     Physical Exam  ENT: Left ear canal clear. Right canal obstructed with cerumen. No inflammation    Assessment & Plan:     1. Impacted cerumen of left ear After soaking with Debrox, ear canal was irrigated with water until clear. Patient tolerated procedure well.   The entirety of the information documented in the History of Present Illness, Review of Systems and Physical Exam were personally obtained by me. Portions of this information were initially documented by April M. Hyacinth MeekerMiller,  CMA and reviewed by me for thoroughness and accuracy.        Mila Merryonald Fisher, MD  [facility]

## 2017-11-01 ENCOUNTER — Other Ambulatory Visit: Payer: Self-pay | Admitting: *Deleted

## 2017-11-01 DIAGNOSIS — I10 Essential (primary) hypertension: Secondary | ICD-10-CM

## 2017-11-01 MED ORDER — LISINOPRIL 20 MG PO TABS: ORAL_TABLET | ORAL | 3 refills | Status: DC

## 2017-12-09 IMAGING — NM NM GI BLOOD LOSS
2 series · 12 of 12 positions shown · non-contrast
Comparison: CT abdomen and pelvis 08/15/2014

CLINICAL DATA: Lower GI bleed. Active bleeding currently. Fatigue
and weakness.

EXAM:
NUCLEAR MEDICINE GASTROINTESTINAL BLEEDING SCAN
TECHNIQUE: Sequential abdominal images were obtained following intravenous
administration of Xc-HHm labeled red blood cells.
RADIOPHARMACEUTICALS:  23.21 mCi Xc-HHm in-vitro labeled red cells.

[Series 1000: hour 1 gi bleed · 4.80mm/px · 6 of 60 frames shown]
[frame 6/60]
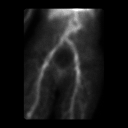
[frame 16/60]
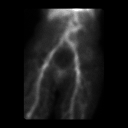
[frame 26/60]
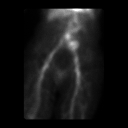
[frame 36/60]
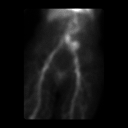
[frame 46/60]
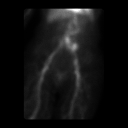
[frame 56/60]
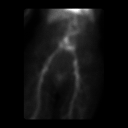

[Series 1000: gi bleed hour 2 · 4.80mm/px · 6 of 60 frames shown]
[frame 6/60]
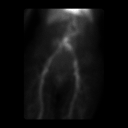
[frame 16/60]
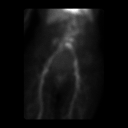
[frame 26/60]
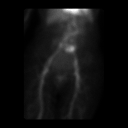
[frame 36/60]
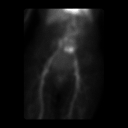
[frame 46/60]
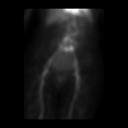
[frame 56/60]
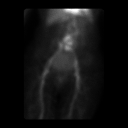

[12 of 12 positions shown; findings below may reference images not displayed]

FINDINGS: Radiotracer accumulation is demonstrated centrally in the abdomen
consistent with an acute gastrointestinal bleed, likely in the mid
abdominal small bowel. Normal blood pool activity.
IMPRESSION: Acute gastrointestinal bleeding, likely in the mid abdominal small
bowel.

These results were called by telephone at the time of interpretation
on 03/26/2017 at [DATE] to RN Otger, who verbally acknowledged
these results.

## 2017-12-28 ENCOUNTER — Other Ambulatory Visit: Payer: Self-pay | Admitting: Family Medicine

## 2017-12-28 MED ORDER — POTASSIUM CHLORIDE ER 10 MEQ PO TBCR: 10.0000 meq | EXTENDED_RELEASE_TABLET | Freq: Every day | ORAL | 3 refills | Status: DC

## 2017-12-28 NOTE — Telephone Encounter (Signed)
[facility] for the following medication. Thanks CC  potassium chloride (K-DUR) 10 MEQ tablet

## 2017-12-28 NOTE — Telephone Encounter (Signed)
Dr. Sherrie MustacheFisher, this medication was listed in patients chart as historical. I don't see where you ever prescribed it.

## 2018-02-01 ENCOUNTER — Other Ambulatory Visit: Payer: Self-pay | Admitting: Family Medicine

## 2018-03-31 ENCOUNTER — Telehealth: Payer: Self-pay | Admitting: Family Medicine

## 2018-03-31 NOTE — Telephone Encounter (Signed)
No answer, unable to leave a vmsg °

## 2018-05-13 ENCOUNTER — Ambulatory Visit (INDEPENDENT_AMBULATORY_CARE_PROVIDER_SITE_OTHER): Payer: Medicare Other | Admitting: Family Medicine

## 2018-05-13 ENCOUNTER — Encounter: Payer: Self-pay | Admitting: Family Medicine

## 2018-05-13 VITALS — BP 136/80 | HR 76 | Temp 97.8°F | Resp 18 | Wt 117.0 lb

## 2018-05-13 DIAGNOSIS — B351 Tinea unguium: Secondary | ICD-10-CM | POA: Diagnosis not present

## 2018-05-13 DIAGNOSIS — L308 Other specified dermatitis: Secondary | ICD-10-CM

## 2018-05-13 MED ORDER — MOMETASONE FUROATE 0.1 % EX CREA: 1.0000 "application " | TOPICAL_CREAM | Freq: Every day | CUTANEOUS | 0 refills | Status: DC

## 2018-05-13 NOTE — Patient Instructions (Addendum)
Let us know if the rash is not improving or relieved by the cream. We will call you about the podiatry referral.

## 2018-05-13 NOTE — Progress Notes (Signed)
  Subjective:     Patient ID: Maria LodgeMargaret L Neiss, female   DOB: 10-08-1930, 84 y.o.   MRN: [id number] Chief Complaint  Patient presents with  . Pruritis    itching around neck x 2 days   HPI States she get an intermittent rash with itching in her right posterior neck/ear area. Also wishes referral back to podiatry for trimming of her dystrophic nails. Accompanied by her son today. Review of Systems     Objective:   Physical Exam  Constitutional: She appears well-developed and well-nourished. No distress.  Skin:  No rash noted on her neck but has residual scaling behind her right posterior ear.       Assessment:    1. Other eczema - mometasone (ELOCON) 0.1 % cream; Apply 1 application topically daily. As needed for neck/posterior ear rash  Dispense: 45 g; Refill: 0  2. Onychomycosis of toenail - Ambulatory referral to Podiatry    Plan:    Further f/u if not improving.

## 2018-05-23 ENCOUNTER — Telehealth: Payer: Self-pay | Admitting: Family Medicine

## 2018-05-23 NOTE — Telephone Encounter (Signed)
yes

## 2018-05-23 NOTE — Telephone Encounter (Signed)
Patient wants to know if she can use Xalatan drops  and Elocon cream at the same time.

## 2018-05-24 NOTE — Telephone Encounter (Signed)
Patient advised she states that she does not want to go back to the same podiatrist she asks to be referred. KW

## 2018-05-24 NOTE — Telephone Encounter (Signed)
Pt rescheduled to see Dr Stac[facility] AcresMayer at [facility] 05/30/18 at 4:00

## 2018-05-24 NOTE — Telephone Encounter (Signed)
[id number] 

## 2018-05-24 NOTE — Telephone Encounter (Signed)
Previously scheduled with Dr.Cline. Please see who else is available.

## 2018-05-30 ENCOUNTER — Encounter: Payer: Self-pay | Admitting: Podiatry

## 2018-05-30 ENCOUNTER — Ambulatory Visit (INDEPENDENT_AMBULATORY_CARE_PROVIDER_SITE_OTHER): Payer: Medicare Other | Admitting: Podiatry

## 2018-05-30 DIAGNOSIS — M79674 Pain in right toe(s): Secondary | ICD-10-CM

## 2018-05-30 DIAGNOSIS — B351 Tinea unguium: Secondary | ICD-10-CM

## 2018-05-30 DIAGNOSIS — M79675 Pain in left toe(s): Secondary | ICD-10-CM | POA: Diagnosis not present

## 2018-05-30 NOTE — Progress Notes (Signed)
Complaint:  Visit Type: Patient presents  to my office for preventative foot care services. Complaint: Patient states" my nails have grown long and thick and become painful to walk and wear shoes" Patient presents to the office today with her daughter for an evaluation. The patient presents for preventative foot care services. No changes to ROS  Podiatric Exam: Vascular: dorsalis pedis and posterior tibial pulses are palpable bilateral. Capillary return is immediate. Temperature gradient is WNL. Skin turgor WNL  Sensorium: Normal Semmes Weinstein monofilament test. Normal tactile sensation bilaterally. Nail Exam: Pt has thick disfigured discolored nails with subungual debris noted bilateral entire nail hallux through fifth toenails Ulcer Exam: There is no evidence of ulcer or pre-ulcerative changes or infection. Orthopedic Exam: Muscle tone and strength are WNL. No limitations in general ROM. No crepitus or effusions noted. Foot type and digits show no abnormalities. Bony prominences are unremarkable. Skin: No Porokeratosis. No infection or ulcers  Diagnosis:  Onychomycosis, , Pain in right toe, pain in left toes  Treatment & Plan Procedures and Treatment: Consent by patient was obtained for treatment procedures.   Debridement of mycotic and hypertrophic toenails, 1 through 5 bilateral and clearing of subungual debris. No ulceration, no infection noted.  Return Visit-Office Procedure: Patient instructed to return to the office for a follow up visit 3 months for continued evaluation and treatment.    Helane GuntherGregory Delman Goshorn DPM

## 2018-06-24 ENCOUNTER — Ambulatory Visit: Payer: Medicare Other

## 2018-07-04 ENCOUNTER — Ambulatory Visit (INDEPENDENT_AMBULATORY_CARE_PROVIDER_SITE_OTHER): Payer: Medicare Other

## 2018-07-04 VITALS — BP 136/72 | HR 87 | Temp 98.9°F | Ht 62.0 in | Wt 115.8 lb

## 2018-07-04 DIAGNOSIS — Z0000 Encounter for general adult medical examination without abnormal findings: Secondary | ICD-10-CM

## 2018-07-04 NOTE — Progress Notes (Signed)
Subjective:   Maria Bush is a 84 y.o. female who presents for Medicare Annual (Subsequent) preventive examination.  Review of Systems:  N/A  Cardiac Risk Factors include: advanced age (>6755men, 35>65 women);hypertension     Objective:     Vitals: BP 136/72 (BP Location: Right Arm)   Pulse 87   Temp 98.9 F (37.2 C) (Oral)   Ht 5\' 2"  (1.575 m)   Wt 115 lb 12.8 oz (52.5 kg)   BMI 21.18 kg/m   Body mass index is 21.18 kg/m.  Advanced Directives 07/04/2018 06/03/2017 03/30/2017 03/26/2017 03/26/2017 03/25/2017 03/25/2017  Does Patient Have a Medical Advance Directive? Yes;No Yes No - - - Yes  Type of Advance Directive - Living will - Living will - (No Data) Healthcare Power of [place];Living will  Does patient want to make changes to medical advance directive? Yes (MAU/Ambulatory/Procedural Areas - Information given) - - - No - Patient declined - No - Patient declined  Copy of Healthcare Power of Attorney in Chart? - - - - - (No Data) No - copy requested    Tobacco Social History   Tobacco Use  Smoking Status Never Smoker  Smokeless Tobacco Never Used     Counseling given: Not Answered   Clinical Intake:  Pre-visit preparation completed: Yes  Pain : No/denies pain Pain Score: 0-No pain     Nutritional Status: BMI of 19-24  Normal Nutritional Risks: None Diabetes: No  How often do you need to have someone help you when you read instructions, pamphlets, or other written materials from your doctor or pharmacy?: 1 - Never  Interpreter Needed?: No  Information entered by :: [facility]ki, LPN  Past Medical History:  Diagnosis Date  . Acute blood loss anemia   . Arthritis 2005  . Diverticulosis   . GI bleed   . Heart murmur 2005  . Hypertension    Past Surgical History:  Procedure Laterality Date  . ABDOMINAL HYSTERECTOMY  1981  . BLADDER SURGERY  1992  . BREAST BIOPSY  1992  . CARDIAC CATHETERIZATION  2011  . CATARACT EXTRACTION Bilateral 2015  .  CHOLECYSTECTOMY  08/09/2000  . COLONOSCOPY  1982  . DILATION AND CURETTAGE OF UTERUS  1979  . GIVENS CAPSULE STUDY N/A 03/27/2017   Procedure: GIVENS CAPSULE STUDY;  Surgeon: Bernette RedbirdBuccini, Robert, MD;  Location: [facility] ENDOSCOPY;  Service: Endoscopy;  Laterality: N/A;  . RECTOCELE REPAIR  1994   Family History  Problem Relation Age of Onset  . Breast cancer Sister   . Early death Mother   . Stroke Father   . Heart disease Brother   . Heart attack Brother   . Heart disease Brother   . Heart attack Brother    Social History   Socioeconomic History  . Marital status: Widowed    Spouse name: Not on file  . Number of children: 2  . Years of education: HS  . Highest education level: High school graduate  Occupational History  . Occupation: Retired    Comment: Public relations account executiveMill Worker  Social Needs  . Financial resource strain: Not hard at all  . Food insecurity:    Worry: Never true    Inability: Never true  . Transportation needs:    Medical: No    Non-medical: No  Tobacco Use  . Smoking status: Never Smoker  . Smokeless tobacco: Never Used  Substance and Sexual Activity  . Alcohol use: No  . Drug use: No  . Sexual activity: Not on file  Lifestyle  . Physical activity:    Days per week: Not on file    Minutes per session: Not on file  . Stress: Not at all  Relationships  . Social connections:    Talks on phone: Not on file    Gets together: Not on file    Attends religious service: Not on file    Active member of club or organization: Not on file    Attends meetings of clubs or organizations: Not on file    Relationship status: Not on file  Other Topics Concern  . Not on file  Social History Narrative   [place] lives in [place]. Her daughter lives with her, with 3 grandchildren [id number]. Her daughter is a Research scientist (medical)lab technician. Pt takes care of her grandchildren at home.     Outpatient Encounter Medications as of 07/04/2018  Medication Sig  . atenolol (TENORMIN) 25 MG tablet TAKE ONE (1)  TABLET BY MOUTH EVERY DAY  . Calcium Carbonate-Vitamin D3 600-400 MG-UNIT TABS Take 1 tablet by mouth daily.  . ferrous sulfate 325 (65 FE) MG tablet Take 1 tablet (325 mg total) by mouth daily.  . furosemide (LASIX) 20 MG tablet TAKE ONE (1) TABLET BY MOUTH EVERY DAY  . latanoprost (XALATAN) 0.005 % ophthalmic solution Place 1 drop into both eyes at bedtime.  Marland Kitchen. lisinopril (PRINIVIL,ZESTRIL) 20 MG tablet TAKE 1 TABLET BY MOUTH ONCE A DAY  . loratadine (CLARITIN) 10 MG tablet Take by mouth daily as needed.   . mometasone (ELOCON) 0.1 % cream Apply 1 application topically daily. As needed for neck/posterior ear rash  . omeprazole (PRILOSEC) 40 MG capsule TAKE ONE (1) CAPSULE EACH DAY  . potassium chloride (K-DUR) 10 MEQ tablet Take 1 tablet (10 mEq total) by mouth daily.  Marland Kitchen. amLODipine (NORVASC) 5 MG tablet Take by mouth.  Marland Kitchen. aspirin EC 81 MG tablet Take 81 mg by mouth daily.  . mirtazapine (REMERON) 30 MG tablet Take by mouth.  . traMADol (ULTRAM) 50 MG tablet Take 1 tablet (50 mg total) by mouth every 8 (eight) hours as needed. (Patient not taking: Reported on 05/13/2018)  . Travoprost, BAK Free, (TRAVATAN Z) 0.004 % SOLN ophthalmic solution Place 1 drop into both eyes at bedtime.    No facility-administered encounter medications on file as of 07/04/2018.     Activities of Daily Living In your present state of health, do you have any difficulty performing the following activities: 07/04/2018  Hearing? N  Vision? N  Difficulty concentrating or making decisions? Y  Walking or climbing stairs? Y  Comment Due to bilateral knee pain.   Dressing or bathing? N  Doing errands, shopping? Y  Comment Does not drive.   Preparing Food and eating ? N  Using the Toilet? N  In the past six months, have you accidently leaked urine? N  Do you have problems with loss of bowel control? N  Managing your Medications? N  Managing your Finances? Y  Comment Son manages finances.   Housekeeping or managing your  Housekeeping? N  Some recent data might be hidden    Patient Care Team: Malva LimesFisher, Donald E, MD as PCP - General (Family Medicine) Anola Gurneyhauvin, Robert, [place] as Referring Physician (Family Medicine)    Assessment:   This is a routine wellness examination for [facility] Asc[facility]aret.  Exercise Activities and Dietary recommendations Current Exercise Habits: The patient does not participate in regular exercise at present, Exercise limited by: orthopedic condition(s)  Goals    . Increase  water intake     Recommend increasing water intake to 4-6 glasses of water a day.        Fall Risk Fall Risk  07/04/2018 06/03/2017 01/24/2016 04/25/2015  Falls in the past year? No Yes Yes Yes  Number falls in past yr: - 1 2 or more 2 or more  Injury with Fall? - No No No  Risk Factor Category  - - - High Fall Risk  Follow up - Falls prevention discussed - -   Is the patient's home free of loose throw rugs in walkways, pet beds, electrical cords, etc?   yes      Grab bars in the bathroom? yes      Handrails on the stairs?   no      Adequate lighting?   yes  Timed Get Up and Go performed: N/A  Depression Screen PHQ 2/9 Scores 07/04/2018 06/03/2017 06/03/2017 01/24/2016  PHQ - 2 Score 0 0 0 0  PHQ- 9 Score - 2 - -     Cognitive Function: Pt declined screening today.      6CIT Screen 06/03/2017  What Year? 0 points  What month? 0 points  What time? 0 points  Count back from 20 2 points  Months in reverse 4 points  Repeat phrase 10 points  Total Score 16    Immunization History  Administered Date(s) Administered  . Influenza, High Dose Seasonal PF 09/24/2015, 09/25/2017  . Pneumococcal Conjugate-13 09/24/2015  . Pneumococcal Polysaccharide-23 09/29/2004  . Td 07/22/2007    Qualifies for Shingles Vaccine? Due for Shingles vaccine. Declined my offer to administer today. Education has been provided regarding the importance of this vaccine. Pt has been advised to call her insurance company to determine her out of  pocket expense. Advised she may also receive this vaccine at her local pharmacy or Health Dept. Verbalized acceptance and understanding.  Screening Tests Health Maintenance  Topic Date Due  . TETANUS/TDAP  07/21/2017  . INFLUENZA VACCINE  06/16/2018  . DEXA SCAN  Completed  . PNA vac Low Risk Adult  Completed    Cancer Screenings: Lung: Low Dose CT Chest recommended if Age 36-80 years, 30 pack-year currently smoking OR have quit w/in 15years. Patient does not qualify. Breast:  Up to date on Mammogram? Yes   Up to date of Bone Density/Dexa? Yes Colorectal: N/A  Additional Screenings:  Hepatitis C Screening: N/A     Plan:  I have personally reviewed and addressed the Medicare Annual Wellness questionnaire and have noted the following in the patient's chart:  A. Medical and social history B. Use of alcohol, tobacco or illicit drugs  C. Current medications and supplements D. Functional ability and status E.  Nutritional status F.  Physical activity G. Advance directives H. List of other physicians I.  Hospitalizations, surgeries, and ER visits in previous 12 months J.  Vitals K. Screenings such as hearing and vision if needed, cognitive and depression L. Referrals and appointments - none  In addition, I have reviewed and discussed with patient certain preventive protocols, quality metrics, and best practice recommendations. A written personalized care plan for preventive services as well as general preventive health recommendations were provided to patient.  See attached scanned questionnaire for additional information.   Signed,  Hyacinth Meeker, LPN Nurse Health Advisor   Nurse Recommendations: Pt declined the tetanus vaccine today.

## 2018-07-04 NOTE — Patient Instructions (Signed)
[name] , Thank you for taking time to come for your Medicare Wellness Visit. I appreciate your ongoing commitment to your health goals. Please review the following plan we discussed and let me know if I can assist you in the future.   Screening recommendations/referrals: Colonoscopy: N/A Mammogram: N/A Bone Density: Up to date Recommended yearly ophthalmology/optometry visit for glaucoma screening and checkup Recommended yearly dental visit for hygiene and checkup  Vaccinations: Influenza vaccine: Up to date Pneumococcal vaccine: Up to date Tdap vaccine: Up to date Shingles vaccine: Pt declines today.     Advanced directives: Advance directive discussed with you today. I have provided a copy for you to complete at home and have notarized. Once this is complete please bring a copy in to our office so we can scan it into your chart.  Conditions/risks identified: Recommend increasing water intake to 4 glasses a day.   Next appointment: 08/10/18 @ 9 AM with Dr Sherrie MustacheFisher.    Preventive Care 5865 Years and Older, Female Preventive care refers to lifestyle choices and visits with your health care provider that can promote health and wellness. What does preventive care include?  A yearly physical exam. This is also called an annual well check.  Dental exams once or twice a year.  Routine eye exams. Ask your health care provider how often you should have your eyes checked.  Personal lifestyle choices, including:  Daily care of your teeth and gums.  Regular physical activity.  Eating a healthy diet.  Avoiding tobacco and drug use.  Limiting alcohol use.  Practicing safe sex.  Taking low-dose aspirin every day.  Taking vitamin and mineral supplements as recommended by your health care provider. What happens during an annual well check? The services and screenings done by your health care provider during your annual well check will depend on your age, overall health, lifestyle  risk factors, and family history of disease. Counseling  Your health care provider may ask you questions about your:  Alcohol use.  Tobacco use.  Drug use.  Emotional well-being.  Home and relationship well-being.  Sexual activity.  Eating habits.  History of falls.  Memory and ability to understand (cognition).  Work and work Astronomerenvironment.  Reproductive health. Screening  You may have the following tests or measurements:  Height, weight, and BMI.  Blood pressure.  Lipid and cholesterol levels. These may be checked every 5 years, or more frequently if you are over 82 years old.  Skin check.  Lung cancer screening. You may have this screening every year starting at age 82 if you have a 30-pack-year history of smoking and currently smoke or have quit within the past 15 years.  Fecal occult blood test (FOBT) of the stool. You may have this test every year starting at age 82.  Flexible sigmoidoscopy or colonoscopy. You may have a sigmoidoscopy every 5 years or a colonoscopy every 10 years starting at age 82.  Hepatitis C blood test.  Hepatitis B blood test.  Sexually transmitted disease (STD) testing.  Diabetes screening. This is done by checking your blood sugar (glucose) after you have not eaten for a while (fasting). You may have this done every 1-3 years.  Bone density scan. This is done to screen for osteoporosis. You may have this done starting at age 82.  Mammogram. This may be done every 1-2 years. Talk to your health care provider about how often you should have regular mammograms. Talk with your health care provider about your test  results, treatment options, and if necessary, the need for more tests. Vaccines  Your health care provider may recommend certain vaccines, such as:  Influenza vaccine. This is recommended every year.  Tetanus, diphtheria, and acellular pertussis (Tdap, Td) vaccine. You may need a Td booster every 10 years.  Zoster vaccine.  You may need this after age 82.  Pneumococcal 13-valent conjugate (PCV13) vaccine. One dose is recommended after age 82.  Pneumococcal polysaccharide (PPSV23) vaccine. One dose is recommended after age 82. Talk to your health care provider about which screenings and vaccines you need and how often you need them. This information is not intended to replace advice given to you by your health care provider. Make sure you discuss any questions you have with your health care provider. Document Released: 11/29/2015 Document Revised: 07/22/2016 Document Reviewed: 09/03/2015 Elsevier Interactive Patient Education  2017 [place] Prevention in the Home Falls can cause injuries. They can happen to people of all ages. There are many things you can do to make your home safe and to help prevent falls. What can I do on the outside of my home?  Regularly fix the edges of walkways and driveways and fix any cracks.  Remove anything that might make you trip as you walk through a door, such as a raised step or threshold.  Trim any bushes or trees on the path to your home.  Use bright outdoor lighting.  Clear any walking paths of anything that might make someone trip, such as rocks or tools.  Regularly check to see if handrails are loose or broken. Make sure that both sides of any steps have handrails.  Any raised decks and porches should have guardrails on the edges.  Have any leaves, snow, or ice cleared regularly.  Use sand or salt on walking paths during winter.  Clean up any spills in your garage right away. This includes oil or grease spills. What can I do in the bathroom?  Use night lights.  Install grab bars by the toilet and in the tub and shower. Do not use towel bars as grab bars.  Use non-skid mats or decals in the tub or shower.  If you need to sit down in the shower, use a plastic, non-slip stool.  Keep the floor dry. Clean up any water that spills on the floor as soon  as it happens.  Remove soap buildup in the tub or shower regularly.  Attach bath mats securely with double-sided non-slip rug tape.  Do not have throw rugs and other things on the floor that can make you trip. What can I do in the bedroom?  Use night lights.  Make sure that you have a light by your bed that is easy to reach.  Do not use any sheets or blankets that are too big for your bed. They should not hang down onto the floor.  Have a firm chair that has side arms. You can use this for support while you get dressed.  Do not have throw rugs and other things on the floor that can make you trip. What can I do in the kitchen?  Clean up any spills right away.  Avoid walking on wet floors.  Keep items that you use a lot in easy-to-reach places.  If you need to reach something above you, use a strong step stool that has a grab bar.  Keep electrical cords out of the way.  Do not use floor polish or wax that makes  floors slippery. If you must use wax, use non-skid floor wax.  Do not have throw rugs and other things on the floor that can make you trip. What can I do with my stairs?  Do not leave any items on the stairs.  Make sure that there are handrails on both sides of the stairs and use them. Fix handrails that are broken or loose. Make sure that handrails are as long as the stairways.  Check any carpeting to make sure that it is firmly attached to the stairs. Fix any carpet that is loose or worn.  Avoid having throw rugs at the top or bottom of the stairs. If you do have throw rugs, attach them to the floor with carpet tape.  Make sure that you have a light switch at the top of the stairs and the bottom of the stairs. If you do not have them, ask someone to add them for you. What else can I do to help prevent falls?  Wear shoes that:  Do not have high heels.  Have rubber bottoms.  Are comfortable and fit you well.  Are closed at the toe. Do not wear sandals.  If  you use a stepladder:  Make sure that it is fully opened. Do not climb a closed stepladder.  Make sure that both sides of the stepladder are locked into place.  Ask someone to hold it for you, if possible.  Clearly mark and make sure that you can see:  Any grab bars or handrails.  First and last steps.  Where the edge of each step is.  Use tools that help you move around (mobility aids) if they are needed. These include:  Canes.  Walkers.  Scooters.  Crutches.  Turn on the lights when you go into a dark area. Replace any light bulbs as soon as they burn out.  Set up your furniture so you have a clear path. Avoid moving your furniture around.  If any of your floors are uneven, fix them.  If there are any pets around you, be aware of where they are.  Review your medicines with your doctor. Some medicines can make you feel dizzy. This can increase your chance of falling. Ask your doctor what other things that you can do to help prevent falls. This information is not intended to replace advice given to you by your health care provider. Make sure you discuss any questions you have with your health care provider. Document Released: 08/29/2009 Document Revised: 04/09/2016 Document Reviewed: 12/07/2014 Elsevier Interactive Patient Education  2017 [employer].

## 2018-07-05 ENCOUNTER — Other Ambulatory Visit: Payer: Self-pay | Admitting: Family Medicine

## 2018-07-05 DIAGNOSIS — M545 Low back pain: Secondary | ICD-10-CM

## 2018-07-05 NOTE — Telephone Encounter (Signed)
Pt's son Ethelene Brownsnthony contacted office for refill request on the following medications:  traMADol (ULTRAM) 50 MG tablet  Lubertha Southsher McAdams  Last Rx: 04/06/17 LOV: 05/13/18 with Nadine CountsBob 10/05/17 with Dr. Delfina RedwoodFisher  Anthony stated that pt's Tramadol expired April 2019 and pt has been having leg pain. Ethelene Brownsnthony stated the medication helped pt with her leg pain before and doesn't want to give pt expired medication. Please advise. Thanks TNP

## 2018-07-06 MED ORDER — TRAMADOL HCL 50 MG PO TABS: 50.0000 mg | ORAL_TABLET | Freq: Three times a day (TID) | ORAL | 3 refills | Status: DC | PRN

## 2018-07-06 NOTE — Addendum Note (Signed)
Addended by: Mila MerryFISHER, DONALD E on: 07/06/2018 09:00 AM   Modules accepted: Orders

## 2018-08-06 ENCOUNTER — Emergency Department: Payer: Medicare Other

## 2018-08-06 ENCOUNTER — Encounter: Payer: Self-pay | Admitting: Emergency Medicine

## 2018-08-06 ENCOUNTER — Other Ambulatory Visit: Payer: Self-pay

## 2018-08-06 ENCOUNTER — Emergency Department
Admission: EM | Admit: 2018-08-06 | Discharge: 2018-08-06 | Disposition: A | Discharge status: Home / Self Care | Payer: Medicare Other | Attending: Emergency Medicine | Admitting: Emergency Medicine

## 2018-08-06 DIAGNOSIS — Z79899 Other long term (current) drug therapy: Secondary | ICD-10-CM | POA: Insufficient documentation

## 2018-08-06 DIAGNOSIS — Z7982 Long term (current) use of aspirin: Secondary | ICD-10-CM | POA: Insufficient documentation

## 2018-08-06 DIAGNOSIS — R0789 Other chest pain: Secondary | ICD-10-CM | POA: Insufficient documentation

## 2018-08-06 DIAGNOSIS — S299XXA Unspecified injury of thorax, initial encounter: Secondary | ICD-10-CM | POA: Diagnosis not present

## 2018-08-06 DIAGNOSIS — I10 Essential (primary) hypertension: Secondary | ICD-10-CM | POA: Diagnosis not present

## 2018-08-06 DIAGNOSIS — R0781 Pleurodynia: Secondary | ICD-10-CM | POA: Diagnosis not present

## 2018-08-06 DIAGNOSIS — R079 Chest pain, unspecified: Secondary | ICD-10-CM | POA: Diagnosis not present

## 2018-08-06 HISTORY — DX: Other amnesia: R41.3

## 2018-08-06 LAB — BASIC METABOLIC PANEL
ANION GAP: 7 (ref 5–15)
BUN: 16 mg/dL (ref 8–23)
CALCIUM: 9 mg/dL (ref 8.9–10.3)
CO2: 31 mmol/L (ref 22–32)
Chloride: 102 mmol/L (ref 98–111)
Creatinine, Ser: 0.76 mg/dL (ref 0.44–1.00)
GFR calc non Af Amer: >60 mL/min (ref >60)
Glucose, Bld: 102 mg/dL — ABNORMAL HIGH (ref 70–99)
POTASSIUM: 3.5 mmol/L (ref 3.5–5.1)
Sodium: 140 mmol/L (ref 135–145)

## 2018-08-06 LAB — CBC
HEMATOCRIT: 36.1 % (ref 35.0–47.0)
HEMOGLOBIN: 12.3 g/dL (ref 12.0–16.0)
MCH: 34.4 pg — AB (ref 26.0–34.0)
MCHC: 34.2 g/dL (ref 32.0–36.0)
MCV: 100.7 fL — ABNORMAL HIGH (ref 80.0–100.0)
Platelets: 260 10*3/uL (ref 150–440)
RBC: 3.58 MIL/uL — AB (ref 3.80–5.20)
RDW: 13.5 % (ref 11.5–14.5)
WBC: 5.5 10*3/uL (ref 3.6–11.0)

## 2018-08-06 LAB — HEPATIC FUNCTION PANEL
ALT: 14 U/L (ref 0–44)
AST: 23 U/L (ref 15–41)
Albumin: 3.7 g/dL (ref 3.5–5.0)
Alkaline Phosphatase: 50 U/L (ref 38–126)
Bilirubin, Direct: <0.1 mg/dL (ref 0.0–0.2)
Total Bilirubin: 1 mg/dL (ref 0.3–1.2)
Total Protein: 7.1 g/dL (ref 6.5–8.1)

## 2018-08-06 LAB — URINALYSIS, COMPLETE (UACMP) WITH MICROSCOPIC
BILIRUBIN URINE: NEGATIVE
Bacteria, UA: NONE SEEN
Glucose, UA: NEGATIVE mg/dL
HGB URINE DIPSTICK: NEGATIVE
KETONES UR: NEGATIVE mg/dL
NITRITE: NEGATIVE
PH: 6 (ref 5.0–8.0)
Protein, ur: NEGATIVE mg/dL
Specific Gravity, Urine: 1.014 (ref 1.005–1.030)

## 2018-08-06 LAB — TROPONIN I
TROPONIN I: 0.06 ng/mL — AB (ref <0.03)
TROPONIN I: 0.06 ng/mL — AB (ref <0.03)

## 2018-08-06 LAB — LIPASE, BLOOD: LIPASE: 27 U/L (ref 11–51)

## 2018-08-06 MED ORDER — ACETAMINOPHEN 325 MG PO TABS
650.0000 mg | ORAL_TABLET | Freq: Once | ORAL | Status: AC
  Administered 2018-08-06: 650 mg via ORAL
  Filled 2018-08-06: qty 2

## 2018-08-06 NOTE — ED Notes (Signed)
Pt given graham crackers and peanut butter, MD aware.

## 2018-08-06 NOTE — ED Notes (Signed)
ED Provider at bedside. 

## 2018-08-06 NOTE — ED Notes (Signed)
Called lab to verify that hepatic function and lipase could be ran on blood sent earlier. Lab verified.

## 2018-08-06 NOTE — Discharge Instructions (Addendum)
You would strongly prefer to go home at this time which is not unreasonable, however, if there is any change in her chest pain, any shortness of breath, or she appears worse in any way please return to the emergency department.  Baby aspirin every day and this is the risk of bleeding that you are concerned about.  Take Tylenol as we discussed.  If she has chest pain shortness of breath or any other change, and you are worried about her or even if it is relatively mild change in her chronic symptoms please return to the emergency room.  Otherwise we strongly advised that you call cardiology first thing the morning on Monday for close outpatient follow-up.  We also recommend that you call and follow-up with your primary care doctor.  We would ask you to have her take it easy for the next couple days no significant exertion, and again if she feels lightheaded has chest pain or any other concerns please return to the emergency room

## 2018-08-06 NOTE — ED Triage Notes (Signed)
Pt arrived via POV with son with reports of left side pain for the past 2 days, pt initially said she fell, but son says she did not fall.  Pt is tender on the left side to light palpation and radiates to the left shoulder.  Pt reports a little shortness of breath.  Pt does have some memory loss at baseline.

## 2018-08-06 NOTE — ED Provider Notes (Addendum)
[facility] Emergency Department Provider Note  ____________________________________________   I have reviewed the triage vital signs and the nursing notes. Where available I have reviewed prior notes and, if possible and indicated, outside hospital notes.    HISTORY  Chief Complaint Chest Pain    HPI Maria Bush is a 84 y.o. female  Who presents today with some rather vaguely described pain in her ribs. Been going on for years according to family she is seeing doctors for it and told her it was muscular skeletal.  She denies any shortness of breath nausea or vomiting.  States "sometimes it hurts in my ribs".  It is bilateral.  Most recently was in the right sided ribs.  It was according to patient and family not present unless she twisted the wrong way.  If she turns to pick something up or pushes to get out of bed then she has a discomfort which passes in a fleeting manner.  She denies any numbness weakness incontinence of bowel or bladder there is been no fall there is been no trauma she has no abdominal pain she is got no vomiting she has no dysuria no urinary frequency no flank pain.  She cannot exactly tell me where the pain is sometimes into the ribs on the right sometimes the ribs in the left is always when she moves the wrong way.  When she takes Tylenol at home the pain completely goes away.  Is been going on now for couple days, and they wanted to make sure that there was nothing else going on.  This is a recurrence of a problem that she is been having for years patient and family states.  The strong preference is not to be admitted to the hospital.    Past Medical History:  Diagnosis Date  . Acute blood loss anemia   . Arthritis 2005  . Diverticulosis   . GI bleed   . Heart murmur 2005  . Hypertension   . Memory loss     Patient Active Problem List   Diagnosis Date Noted  . GIB (gastrointestinal bleeding) 03/30/2017  . Gastroesophageal  reflux disease with esophagitis   . Acute post-hemorrhagic anemia 03/26/2017  . Bradycardia 03/26/2017  . Acute blood loss anemia 03/26/2017  . Diverticulosis   . GI bleed 03/25/2017  . Left shoulder pain 12/26/2015  . Edema 04/25/2015  . Shortness of breath 04/25/2015  . Unsteady gait 04/25/2015  . Allergic rhinitis 04/24/2015  . Arthropathia 04/24/2015  . B12 deficiency 04/24/2015  . Atherosclerosis of coronary artery 04/24/2015  . DDD (degenerative disc disease), lumbar 04/24/2015  . Clinical depression 04/24/2015  . DD (diverticular disease) 04/24/2015  . Elevated CK 04/24/2015  . Atrial premature depolarization 04/24/2015  . Hypercholesteremia 04/24/2015  . BP (high blood pressure) 04/24/2015  . Anemia, iron deficiency 04/24/2015  . 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 Lower esophageal ring 07/12/2014  . 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 Urge incontinence of urine 09/01/2013  . Incomplete emptying of bladder 09/01/2013  . Other symptoms involving urinary system(788.99) 09/01/2013  . Family history of malignant neoplasm of breast 02/10/2013  . Diffuse cystic mastopathy 02/10/2013  . Arthritis     Past Surgical History:  Procedure Laterality Date  . ABDOMINAL HYSTERECTOMY  1981  . BLADDER SURGERY  1992  . BREAST BIOPSY  1992  . CARDIAC CATHETERIZATION  2011  . CATARACT EXTRACTION Bilateral 2015  . CHOLECYSTECTOMY  08/09/2000  .  COLONOSCOPY  1982  . DILATION AND CURETTAGE OF UTERUS  1979  . GIVENS CAPSULE STUDY N/A 03/27/2017   Procedure: GIVENS CAPSULE STUDY;  Surgeon: Bernette Redbird, MD;  Location: [facility] ENDOSCOPY;  Service: Endoscopy;  Laterality: N/A;  . RECTOCELE REPAIR  1994    Prior to Admission medications   Medication Sig Start Date End Date Taking? Authorizing Provider  amLODipine (NORVASC) 5 MG tablet Take by mouth. 09/01/12   [provider]  aspirin EC 81 MG tablet Take 81 mg by mouth daily.     [provider]  atenolol (TENORMIN) 25 MG tablet TAKE ONE (1) TABLET BY MOUTH EVERY DAY 06/05/17   Malva Limes, MD  Calcium Carbonate-Vitamin D3 600-400 MG-UNIT TABS Take 1 tablet by mouth daily.    [provider]  ferrous sulfate 325 (65 FE) MG tablet Take 1 tablet (325 mg total) by mouth daily. 03/29/17 07/04/18  Rolly Salter, MD  furosemide (LASIX) 20 MG tablet TAKE ONE (1) TABLET BY MOUTH EVERY DAY 05/29/17   Malva Limes, MD  latanoprost (XALATAN) 0.005 % ophthalmic solution Place 1 drop into both eyes at bedtime.    [provider]  lisinopril (PRINIVIL,ZESTRIL) 20 MG tablet TAKE 1 TABLET BY MOUTH ONCE A DAY 11/01/17   Malva Limes, MD  loratadine (CLARITIN) 10 MG tablet Take by mouth daily as needed.     [provider]  mirtazapine (REMERON) 30 MG tablet Take by mouth. 06/19/14   [provider]  mometasone (ELOCON) 0.1 % cream Apply 1 application topically daily. As needed for neck/posterior ear rash 05/13/18   Anola Gurney, PA  omeprazole (PRILOSEC) 40 MG capsule TAKE ONE (1) CAPSULE EACH DAY 02/01/18   Malva Limes, MD  potassium chloride (K-DUR) 10 MEQ tablet Take 1 tablet (10 mEq total) by mouth daily. 12/28/17   Malva Limes, MD  traMADol (ULTRAM) 50 MG tablet Take 1 tablet (50 mg total) by mouth every 8 (eight) hours as needed. 07/06/18   Malva Limes, MD  Travoprost, BAK Free, (TRAVATAN Z) 0.004 % SOLN ophthalmic solution Place 1 drop into both eyes at bedtime.     [provider]    Allergies Cephalexin; Prednisone; Sulfa antibiotics; and Penicillins  Family History  Problem Relation Age of Onset  . Breast cancer Sister   . Early death Mother   . Stroke Father   . Heart disease Brother   . Heart attack Brother   . Heart disease Brother   . Heart attack Brother     Social History Social History   Tobacco Use  . Smoking status: Never Smoker  . Smokeless tobacco: Never Used  Substance Use  Topics  . Alcohol use: No  . Drug use: No    Review of Systems Constitutional: No fever/chills Eyes: No visual changes. ENT: No sore throat. No stiff neck no neck pain Cardiovascular: The HPI regarding chest pain. Respiratory: Denies shortness of breath. Gastrointestinal:   no vomiting.  No diarrhea.  No constipation. Genitourinary: Negative for dysuria. Musculoskeletal: Negative lower extremity swelling Skin: Negative for rash. Neurological: Negative for severe headaches, focal weakness or numbness.   ____________________________________________   PHYSICAL EXAM:  VITAL SIGNS: ED Triage Vitals  Enc Vitals Group     BP 08/06/18 1318 123/66     Pulse Rate 08/06/18 1318 63     Resp 08/06/18 1318 18     Temp 08/06/18 1318 98.1 F (36.7 C)     Temp  Source 08/06/18 1318 Oral     SpO2 08/06/18 1318 97 %     Weight 08/06/18 1318 120 lb (54.4 kg)     Height 08/06/18 1318 5\' 2"  (1.575 m)     Head Circumference --      Peak Flow --      Pain Score 08/06/18 1535 0     Pain Loc --      Pain Edu? --      Excl. in GC? --     Constitutional: Alert and oriented. Well appearing and in no acute distress. Eyes: Conjunctivae are normal Head: Atraumatic HEENT: No congestion/rhinnorhea. Mucous membranes are moist.  Oropharynx non-erythematous Neck:   Nontender with no meningismus, no masses, no stridor Cardiovascular: Normal rate, regular rhythm. Grossly normal heart sounds.  Good peripheral circulation. : There is minimal tenderness palpation of the ribs on both sides, there is no midline tenderness in the spine nothing to suggest radiculopathy there are no shingles identified.  Patient has no rib fractures palpated no crepitus no flail chest but I cannot elicit her discomfort by touching her ribs.  There is no tenderness to her abdomen underneath her ribs.  When she pulls up in the better twist in the bed this also elicits the discomfort. Respiratory: Normal respiratory effort.  No  retractions. Lungs CTAB. Abdominal: Soft and nontender. No distention. No guarding no rebound Back:  There is no focal tenderness or step off.  there is no midline tenderness there are no lesions noted. there is no CVA tenderness Musculoskeletal: No lower extremity tenderness, no upper extremity tenderness. No joint effusions, no DVT signs strong distal pulses no edema Neurologic:  Normal speech and language. No gross focal neurologic deficits are appreciated.  Skin:  Skin is warm, dry and intact. No rash noted. Psychiatric: Mood and affect are normal. Speech and behavior are normal.  ____________________________________________   LABS (all labs ordered are listed, but only abnormal results are displayed)  Labs Reviewed  BASIC METABOLIC PANEL - Abnormal; Notable for the following components:      Result Value   Glucose, Bld 102 (*)    All other components within normal limits  CBC - Abnormal; Notable for the following components:   RBC 3.58 (*)    MCV 100.7 (*)    MCH 34.4 (*)    All other components within normal limits  TROPONIN I - Abnormal; Notable for the following components:   Troponin I 0.06 (*)    All other components within normal limits  TROPONIN I - Abnormal; Notable for the following components:   Troponin I 0.06 (*)    All other components within normal limits  URINALYSIS, COMPLETE (UACMP) WITH MICROSCOPIC - Abnormal; Notable for the following components:   Color, Urine YELLOW (*)    APPearance CLEAR (*)    Leukocytes, UA TRACE (*)    All other components within normal limits  URINE CULTURE  HEPATIC FUNCTION PANEL  LIPASE, BLOOD    Pertinent labs  results that were available during my care of the patient were reviewed by me and considered in my medical decision making (see chart for details). ____________________________________________  EKG  I personally interpreted any EKGs ordered by me or triage Sinus rhythm rate 56 bpm, mild bradycardia noted no  ischemia changes acutely noted.  T waves anteriorly noted, similar to prior no recent EKG from 2015 ____________________________________________  RADIOLOGY  Pertinent labs & imaging results that were available during my care of the patient were reviewed  by me and considered in my medical decision making (see chart for details). If possible, patient and/or family made aware of any abnormal findings.  Dg Chest 2 View  Result Date: 08/06/2018 CLINICAL DATA:  84 year old female with acute LEFT chest and rib pain following fall yesterday. Initial encounter. EXAM: CHEST - 2 VIEW COMPARISON:  07/29/2013 radiograph FINDINGS: The cardiomediastinal silhouette is unremarkable. There is no evidence of focal airspace disease, pulmonary edema, suspicious pulmonary nodule/mass, pleural effusion, or pneumothorax. No acute bony abnormalities are identified. Degenerative changes within the LEFT shoulder noted. IMPRESSION: No active cardiopulmonary disease. Electronically Signed   By: Harmon PierJeffrey  Hu M.D.   On: 08/06/2018 14:02   ____________________________________________    PROCEDURES  Procedure(s) performed: None  Procedures  Critical Care performed: None  ____________________________________________   INITIAL IMPRESSION / ASSESSMENT AND PLAN / ED COURSE  Pertinent labs & imaging results that were available during my care of the patient were reviewed by me and considered in my medical decision making (see chart for details).  Patient here with very reproducible chest wall pain this been going on for years, is very positional and is very reproducible.  Troponin initially was 0.06 which is of some concern but on repeat it is the same.  There is no evidence of a trending up.  She is been having this discomfort for several days.  It is the patient's and family strong preference not to be admitted to the hospital.  This is certainly a very atypical presentation for possible ACS and I do not think it represents  a PE dissection or other intrathoracic pathology.  Serially checked her abdomen and it is completely benign.  She has no pain unless she moves the wrong way in the bed.  Liver function tests are normal.  Urinalysis is reassuring no evidence that this is pyelonephritis.  White count is normal, liver function tests and lipase are reassuring, blood work is quite unrevealing aside from a borderline troponin.  However this is not trending up.  I do not think she is a candidate for emergent heart cath given this history, we will see about trying to get her home as is her strong preference.  ----------------------------------------- 6:33 PM on 08/06/2018 -----------------------------------------  Troponin is not trending up, patient has no ongoing symptoms unless she moves after Tylenol here she feels much better, this has been going on for years according to family, I did offer admission to the hospital because of the troponin but there is strong preference would be to go home they will see the doctor in a few days.  She is not having any pleuritic pain, I did talk to Dr. Elease HashimotoNahser of cardiology, and it turns out the patient has actually seen Dr. Lady Garyfath of a different group years ago.  He was kind enough however to give me a consult.  He feels that the patient's troponin should not be enough to mandate admission to the hospital is unlikely that this would result in any acute intervention over the weekend anyway.  Given the family and patient strong preference to go home and her nature of her symptoms and the chronicity thereof, we will see about trying to discharge her.  At this time, there does not appear to be clinical evidence to support the diagnosis of pulmonary embolus, dissection, myocarditis, endocarditis, pericarditis, pericardial tamponade, acute coronary syndrome, pneumothorax, pneumonia, or any other acute intrathoracic pathology that will require admission or acute intervention. Nor is there evidence of any  significant intra-abdominal pathology causing  this discomfort.  Patient and family are acutely aware of the need to come back if there is any change in her condition.  ----------------------------------------- 6:42 PM on 08/06/2018 -----------------------------------------  Prior to discharge I did have another conversation with patient and family, it is still their strong preference to go home.  They state "only certain positions make this happen we do not think it is her heart" I agree given her history but again they understand the limitation of the work-up here and they will follow closely with cardiologist.  This and the risk factors going home and are very comfortable with it.  Advised baby asa till they can see card, tylenol, and if she feels worse they will come back.     ____________________________________________   FINAL CLINICAL IMPRESSION(S) / ED DIAGNOSES  Final diagnoses:  None      This chart was dictated using voice recognition software.  Despite best efforts to proofread,  errors can occur which can change meaning.      Jeanmarie Plant, MD 08/06/18 1823    Jeanmarie Plant, MD 08/06/18 Berna Spare    Jeanmarie Plant, MD 08/06/18 1843

## 2018-08-06 NOTE — ED Notes (Signed)
Lab verified urine culture could be added on to urine sent earlier.

## 2018-08-06 NOTE — ED Notes (Signed)
Pt assisted to bathroom. Peri care performed.

## 2018-08-08 LAB — URINE CULTURE

## 2018-08-10 ENCOUNTER — Encounter: Payer: Self-pay | Admitting: Family Medicine

## 2018-08-10 ENCOUNTER — Ambulatory Visit (INDEPENDENT_AMBULATORY_CARE_PROVIDER_SITE_OTHER): Payer: Medicare Other | Admitting: Family Medicine

## 2018-08-10 VITALS — BP 154/74 | HR 50 | Temp 97.5°F | Resp 16

## 2018-08-10 DIAGNOSIS — R748 Abnormal levels of other serum enzymes: Secondary | ICD-10-CM | POA: Diagnosis not present

## 2018-08-10 DIAGNOSIS — R778 Other specified abnormalities of plasma proteins: Secondary | ICD-10-CM

## 2018-08-10 DIAGNOSIS — R7989 Other specified abnormal findings of blood chemistry: Secondary | ICD-10-CM

## 2018-08-10 DIAGNOSIS — M940 Chondrocostal junction syndrome [Tietze]: Secondary | ICD-10-CM | POA: Diagnosis not present

## 2018-08-10 DIAGNOSIS — G580 Intercostal neuropathy: Secondary | ICD-10-CM | POA: Diagnosis not present

## 2018-08-10 NOTE — Patient Instructions (Signed)
   Pain is likely due to inflammation around ribs (called costochondritis) and irritation of the nerves that travel besides ribs (intercostal nerve impingement)   Apply ice for 8-10 minutes three times a day, and take OTC Tylenol up to three times a day as needed for pain.

## 2018-08-10 NOTE — Progress Notes (Signed)
Patient: Maria Bush Female    DOB: 1929/12/11   84 y.o.   MRN: [id number] Visit Date: 08/10/2018  Today's Provider: Mila Merry, MD   Chief Complaint  Patient presents with  . Follow-up   Subjective:    HPI   Follow up ER visit  Patient was seen in ER for Atypical Chest pain on 08/06/2018. She was treated for Atypical Chest pain. Treatment for this included; patient's family had a strong preference to not admit her.  Was noted to have troponin = 0.6. X 2. Hopitallist advised patient to take a baby asprin until she could see cardiology. Also may take tylenol if pain worsens. Recommended follow up with cardiology, Dr. Lady Gary in 2 days-Patient did not see cardiology. Also advised to follow up with pcp as soon as possible. She reports good compliance with treatment. She reports this condition is Improved. Patient's son states that he has not yet scheduled an appointment with Cardiology.   She was previously followed by Dr. Lady Gary and was last seen in 2015.   ------------------------------------------------------------------------------------    Allergies  Allergen Reactions  . Cephalexin Shortness Of Breath  . Prednisone Nausea And Vomiting  . Sulfa Antibiotics Itching  . Penicillins Rash     Current Outpatient Medications:  .  amLODipine (NORVASC) 5 MG tablet, Take by mouth., Disp: , Rfl:  .  aspirin EC 81 MG tablet, Take 81 mg by mouth daily., Disp: , Rfl:  .  atenolol (TENORMIN) 25 MG tablet, TAKE ONE (1) TABLET BY MOUTH EVERY DAY, Disp: 90 tablet, Rfl: 4 .  Calcium Carbonate-Vitamin D3 600-400 MG-UNIT TABS, Take 1 tablet by mouth daily., Disp: , Rfl:  .  furosemide (LASIX) 20 MG tablet, TAKE ONE (1) TABLET BY MOUTH EVERY DAY, Disp: 90 tablet, Rfl: 4 .  latanoprost (XALATAN) 0.005 % ophthalmic solution, Place 1 drop into both eyes at bedtime., Disp: , Rfl:  .  lisinopril (PRINIVIL,ZESTRIL) 20 MG tablet, TAKE 1 TABLET BY MOUTH ONCE A DAY, Disp: 90 tablet, Rfl:  3 .  loratadine (CLARITIN) 10 MG tablet, Take by mouth daily as needed. , Disp: , Rfl:  .  mirtazapine (REMERON) 30 MG tablet, Take by mouth., Disp: , Rfl:  .  mometasone (ELOCON) 0.1 % cream, Apply 1 application topically daily. As needed for neck/posterior ear rash, Disp: 45 g, Rfl: 0 .  omeprazole (PRILOSEC) 40 MG capsule, TAKE ONE (1) CAPSULE EACH DAY, Disp: 30 capsule, Rfl: 11 .  potassium chloride (K-DUR) 10 MEQ tablet, Take 1 tablet (10 mEq total) by mouth daily., Disp: 90 tablet, Rfl: 3 .  traMADol (ULTRAM) 50 MG tablet, Take 1 tablet (50 mg total) by mouth every 8 (eight) hours as needed., Disp: 30 tablet, Rfl: 3 .  Travoprost, BAK Free, (TRAVATAN Z) 0.004 % SOLN ophthalmic solution, Place 1 drop into both eyes at bedtime. , Disp: , Rfl:  .  ferrous sulfate 325 (65 FE) MG tablet, Take 1 tablet (325 mg total) by mouth daily., Disp: 30 tablet, Rfl: 3  Review of Systems  Constitutional: Negative for appetite change, chills, fatigue and fever.  Respiratory: Negative for chest tightness and shortness of breath.   Cardiovascular: Positive for chest pain (improved). Negative for palpitations.  Gastrointestinal: Negative for abdominal pain, nausea and vomiting.  Neurological: Negative for dizziness and weakness.    Social History   Tobacco Use  . Smoking status: Never Smoker  . Smokeless tobacco: Never Used  Substance Use Topics  .  Alcohol use: No   Objective:   BP (!) 154/74 (BP Location: Left Arm, Cuff Size: Normal)   Pulse (!) 50   Temp (!) 97.5 F (36.4 C) (Oral)   Resp 16   SpO2 97% Comment: room air Vitals:   08/10/18 0918 08/10/18 0922  BP: (!) 150/77 (!) 154/74  Pulse: (!) 50   Resp: 16   Temp: (!) 97.5 F (36.4 C)   TempSrc: Oral   SpO2: 97%      Physical Exam   General Appearance:    Alert, cooperative, no distress  Eyes:    PERRL, conjunctiva/corneas clear, EOM's intact       Lungs:     Clear to auscultation bilaterally, respirations unlabored  Heart:     Regular rate and rhythm  Neurologic:   Awake, alert, oriented x 3. No apparent focal neurological           defect.           Assessment & Plan:      1. Costochondritis Pain is reproducible with palpations.   2. Neuropathy, intercostal nerve Patient Instructions   Pain is likely due to inflammation around ribs (called costochondritis) and irritation of the nerves that travel besides ribs (intercostal nerve impingement)   Apply ice for 8-10 minutes three times a day, and take OTC Tylenol up to three times a day as needed for pain.     3. Elevated troponin level Pain is not c/w cardiac pain, but elevated troponin warrants further evaluation. Has not had follow up with cardiologist since 2015. Will refer back to consider additional evaluation.        Mila Merry, MD  [facility]

## 2018-08-15 ENCOUNTER — Telehealth: Payer: Self-pay | Admitting: *Deleted

## 2018-08-15 ENCOUNTER — Other Ambulatory Visit: Payer: Self-pay | Admitting: Family Medicine

## 2018-08-15 ENCOUNTER — Telehealth: Payer: Self-pay | Admitting: Family Medicine

## 2018-08-15 NOTE — Telephone Encounter (Signed)
Patient's son Maria Bush is requesting a handicap place card form be completed for his mother. Please advise?

## 2018-08-15 NOTE — Telephone Encounter (Signed)
Ethelene Browns was advised.

## 2018-08-15 NOTE — Telephone Encounter (Signed)
Please advise patient that one of her heart enzymes was mildly elevated when she was at the ER on the 21st. I don't think this is related to the pain she was having, but we do need to follow up on it. Huntley Dec is going to set her up to see a cardiologist to have this evaluated.

## 2018-08-15 NOTE — Telephone Encounter (Signed)
Patient's son was advised. Expressed understanding.

## 2018-08-15 NOTE — Telephone Encounter (Signed)
Form completed and read for pickup.

## 2018-08-18 ENCOUNTER — Other Ambulatory Visit: Payer: Self-pay | Admitting: Family Medicine

## 2018-09-01 ENCOUNTER — Ambulatory Visit: Payer: Medicare Other | Admitting: Podiatry

## 2018-09-01 DIAGNOSIS — R079 Chest pain, unspecified: Secondary | ICD-10-CM | POA: Diagnosis not present

## 2018-09-01 DIAGNOSIS — I10 Essential (primary) hypertension: Secondary | ICD-10-CM | POA: Diagnosis not present

## 2018-09-01 DIAGNOSIS — I2510 Atherosclerotic heart disease of native coronary artery without angina pectoris: Secondary | ICD-10-CM | POA: Diagnosis not present

## 2018-09-01 DIAGNOSIS — E782 Mixed hyperlipidemia: Secondary | ICD-10-CM | POA: Diagnosis not present

## 2018-09-01 DIAGNOSIS — I491 Atrial premature depolarization: Secondary | ICD-10-CM | POA: Diagnosis not present

## 2018-10-06 ENCOUNTER — Encounter: Payer: Self-pay | Admitting: Podiatry

## 2018-10-06 ENCOUNTER — Ambulatory Visit (INDEPENDENT_AMBULATORY_CARE_PROVIDER_SITE_OTHER): Payer: Medicare Other | Admitting: Podiatry

## 2018-10-06 DIAGNOSIS — Q828 Other specified congenital malformations of skin: Secondary | ICD-10-CM

## 2018-10-06 DIAGNOSIS — M79674 Pain in right toe(s): Secondary | ICD-10-CM | POA: Diagnosis not present

## 2018-10-06 DIAGNOSIS — B351 Tinea unguium: Secondary | ICD-10-CM | POA: Diagnosis not present

## 2018-10-06 DIAGNOSIS — M79675 Pain in left toe(s): Secondary | ICD-10-CM | POA: Diagnosis not present

## 2018-10-06 NOTE — Progress Notes (Signed)
Complaint:  Visit Type: Patient presents  to my office for preventative foot care services. Complaint: Patient states" my nails have grown long and thick and become painful to walk and wear shoes" Patient presents to the office today with her son  for an evaluation. The patient presents for preventative foot care services. No changes to ROS.  Patient also has painful callus left heel.  Podiatric Exam: Vascular: dorsalis pedis and posterior tibial pulses are palpable bilateral. Capillary return is immediate. Temperature gradient is WNL. Skin turgor WNL  Sensorium: Normal Semmes Weinstein monofilament test. Normal tactile sensation bilaterally. Nail Exam: Pt has thick disfigured discolored nails with subungual debris noted bilateral entire nail hallux through fifth toenails Ulcer Exam: There is no evidence of ulcer or pre-ulcerative changes or infection. Orthopedic Exam: Muscle tone and strength are WNL. No limitations in general ROM. No crepitus or effusions noted. Foot type and digits show no abnormalities. Bony prominences are unremarkable. Skin:  Porokeratosis left heel No infection or ulcers  Diagnosis:  Onychomycosis, , Pain in right toe, pain in left toes,  Porokeratosis  Treatment & Plan Procedures and Treatment: Consent by patient was obtained for treatment procedures.   Debridement of mycotic and hypertrophic toenails, 1 through 5 bilateral and clearing of subungual debris. No ulceration, no infection noted. Debridement of porokeratosis Return Visit-Office Procedure: Patient instructed to return to the office for a follow up visit 3 months for continued evaluation and treatment.    Helane GuntherGregory Arcelia Pals DPM

## 2018-10-31 ENCOUNTER — Other Ambulatory Visit: Payer: Self-pay | Admitting: Family Medicine

## 2018-10-31 DIAGNOSIS — I10 Essential (primary) hypertension: Secondary | ICD-10-CM

## 2018-11-08 ENCOUNTER — Other Ambulatory Visit: Payer: Self-pay | Admitting: Family Medicine

## 2018-11-08 NOTE — Telephone Encounter (Signed)
Please advise 

## 2018-11-08 NOTE — Telephone Encounter (Signed)
[facility] and patient needs Atenolol and Ferrous sulfate sent into [facility] on [address]

## 2018-11-10 MED ORDER — ATENOLOL 25 MG PO TABS: 25.0000 mg | ORAL_TABLET | Freq: Every day | ORAL | 4 refills | Status: DC

## 2018-11-10 MED ORDER — FEROSUL 325 (65 FE) MG PO TABS: 325.0000 mg | ORAL_TABLET | Freq: Every day | ORAL | 4 refills | Status: DC

## 2018-11-11 ENCOUNTER — Ambulatory Visit (INDEPENDENT_AMBULATORY_CARE_PROVIDER_SITE_OTHER): Payer: Medicare Other | Admitting: Physician Assistant

## 2018-11-11 ENCOUNTER — Encounter: Payer: Self-pay | Admitting: Physician Assistant

## 2018-11-11 VITALS — BP 144/71 | HR 59 | Temp 97.6°F | Resp 16 | Wt 125.0 lb

## 2018-11-11 DIAGNOSIS — R600 Localized edema: Secondary | ICD-10-CM

## 2018-11-11 NOTE — Progress Notes (Signed)
Patient: Maria LodgeMargaret L Bush Female    DOB: 01/03/1930   84 y.o.   MRN: [id number] Visit Date: 11/11/2018  Today's Provider: Margaretann LovelessJennifer M Mailey Landstrom, PA-C   Chief Complaint  Patient presents with  . Leg Swelling   Subjective:     HPI  Edema: Patient complains of edema. The location of the edema is knee(s) right, ankle(s) bilateral.  The edema has been moderate.  Onset of symptoms was a few days ago, unchanged since that time. The edema is present all day. The patient states never having before.  The swelling has been aggravated by nothing, relieved by nothing, and been associated with nothing. Cardiac risk factors include hypertension.  Son denies any changes in patient.   Current Outpatient Medications:  .  amLODipine (NORVASC) 5 MG tablet, Take by mouth., Disp: , Rfl:  .  aspirin EC 81 MG tablet, Take 81 mg by mouth daily., Disp: , Rfl:  .  atenolol (TENORMIN) 25 MG tablet, Take 1 tablet (25 mg total) by mouth daily., Disp: 90 tablet, Rfl: 4 .  Calcium Carbonate-Vitamin D3 600-400 MG-UNIT TABS, Take 1 tablet by mouth daily., Disp: 60 tablet, Rfl: 6 .  FEROSUL 325 (65 Fe) MG tablet, Take 1 tablet (325 mg total) by mouth daily., Disp: 90 tablet, Rfl: 4 .  furosemide (LASIX) 20 MG tablet, TAKE ONE (1) TABLET BY MOUTH EVERY DAY, Disp: 90 tablet, Rfl: 3 .  latanoprost (XALATAN) 0.005 % ophthalmic solution, Place 1 drop into both eyes at bedtime., Disp: , Rfl:  .  lisinopril (PRINIVIL,ZESTRIL) 20 MG tablet, TAKE 1 TABLET BY MOUTH ONCE A DAY, Disp: 90 tablet, Rfl: 5 .  omeprazole (PRILOSEC) 40 MG capsule, TAKE ONE (1) CAPSULE EACH DAY, Disp: 30 capsule, Rfl: 11 .  potassium chloride (K-DUR) 10 MEQ tablet, Take 1 tablet (10 mEq total) by mouth daily., Disp: 90 tablet, Rfl: 3 .  traMADol (ULTRAM) 50 MG tablet, Take 1 tablet (50 mg total) by mouth every 8 (eight) hours as needed., Disp: 30 tablet, Rfl: 3 .  Travoprost, BAK Free, (TRAVATAN Z) 0.004 % SOLN ophthalmic solution, Place 1 drop  into both eyes at bedtime. , Disp: , Rfl:  .  loratadine (CLARITIN) 10 MG tablet, Take by mouth daily as needed. , Disp: , Rfl:  .  mirtazapine (REMERON) 30 MG tablet, Take by mouth., Disp: , Rfl:  .  mometasone (ELOCON) 0.1 % cream, Apply 1 application topically daily. As needed for neck/posterior ear rash (Patient not taking: Reported on 11/11/2018), Disp: 45 g, Rfl: 0  Review of Systems  Constitutional: Negative.   Respiratory: Negative.   Cardiovascular: Positive for leg swelling.  Genitourinary: Negative.   Neurological: Negative.     Social History   Tobacco Use  . Smoking status: Never Smoker  . Smokeless tobacco: Never Used  Substance Use Topics  . Alcohol use: No      Objective:   BP (!) 144/71 (BP Location: Left Arm, Patient Position: Sitting, Cuff Size: Normal)   Pulse (!) 59   Temp 97.6 F (36.4 C) (Oral)   Resp 16   Wt 125 lb (56.7 kg)   SpO2 99%   BMI 22.86 kg/m  Vitals:   11/11/18 1501  BP: (!) 144/71  Pulse: (!) 59  Resp: 16  Temp: 97.6 F (36.4 C)  TempSrc: Oral  SpO2: 99%  Weight: 125 lb (56.7 kg)     Physical Exam Vitals signs reviewed.  Constitutional:  General: She is not in acute distress.    Appearance: She is well-developed. She is not diaphoretic.  Neck:     Musculoskeletal: Normal range of motion and neck supple.     Thyroid: No thyromegaly.     Vascular: No JVD.     Trachea: No tracheal deviation.  Cardiovascular:     Rate and Rhythm: Normal rate and regular rhythm.     Heart sounds: Murmur present. No friction rub. No gallop.   Pulmonary:     Effort: Pulmonary effort is normal. No respiratory distress.     Breath sounds: Normal breath sounds. No wheezing or rales.  Musculoskeletal:        General: Swelling (2+ pitting edema bilaterally ) present.        Assessment & Plan    1. Bilateral lower extremity edema Concerned for heart failure due to recent history. Will check labs as below and f/u pending results. If labs  are normal will increase furosemide to 40mg  and increase potassium to 20mEq. Continue atenolol 25mg  daily. F/u with Dr. Sherrie MustacheFisher in 2-4 weeks.  - Basic Metabolic Panel (BMET) - CBC w/Diff/Platelet - B Nat Peptide     Margaretann LovelessJennifer M Lorra Freeman, PA-C  [facility]

## 2018-11-11 NOTE — Patient Instructions (Signed)
Heart Failure °Heart failure is a condition in which the heart has trouble pumping blood because it has become weak or stiff. This means that the heart does not pump blood efficiently for the body to work well. For some people with heart failure, fluid may back up into the lungs and there may be swelling (edema) in the lower legs. Heart failure is usually a long-term (chronic) condition. It is important for you to take good care of yourself and follow the treatment plan from your health care provider. °What are the causes? °This condition is caused by some health problems, including: °· High blood pressure (hypertension). Hypertension causes the heart muscle to work harder than normal. High blood pressure eventually causes the heart to become stiff and weak. °· Coronary artery disease (CAD). CAD is the buildup of cholesterol and fat (plaques) in the arteries of the heart. °· Heart attack (myocardial infarction). Injured tissue, which is caused by the heart attack, does not contract as well and the heart's ability to pump blood is weakened. °· Abnormal heart valves. When the heart valves do not open and close properly, the heart muscle must pump harder to keep the blood flowing. °· Heart muscle disease (cardiomyopathy or myocarditis). Heart muscle disease is damage to the heart muscle from a variety of causes, such as drug or alcohol abuse, infections, or unknown causes. These can increase the risk of heart failure. °· Lung disease. When the lungs do not work properly, the heart must work harder. °What increases the risk? °Risk of heart failure increases as a person ages. This condition is also more likely to develop in people who: °· Are overweight. °· Are female. °· Smoke or chew tobacco. °· Abuse alcohol or illegal drugs. °· Have taken medicines that can damage the heart, such as chemotherapy drugs. °· Have diabetes. °? High blood sugar (glucose) is associated with high fat (lipid) levels in the blood. °? Diabetes  can also damage tiny blood vessels that carry nutrients to the heart muscle. °· Have abnormal heart rhythms. °· Have thyroid problems. °· Have low blood counts (anemia). °What are the signs or symptoms? °Symptoms of this condition include: °· Shortness of breath with activity, such as when climbing stairs. °· Persistent cough. °· Swelling of the feet, ankles, legs, or abdomen. °· Unexplained weight gain. °· Difficulty breathing when lying flat (orthopnea). °· Waking from sleep because of the need to sit up and get more air. °· Rapid heartbeat. °· Fatigue and loss of energy. °· Feeling light-headed, dizzy, or close to fainting. °· Loss of appetite. °· Nausea. °· Increased urination during the night (nocturia). °· Confusion. °How is this diagnosed? °This condition is diagnosed based on: °· Medical history, symptoms, and a physical exam. °· Diagnostic tests, which may include: °? Echocardiogram. °? Electrocardiogram (ECG). °? Chest X-ray. °? Blood tests. °? Exercise stress test. °? Radionuclide scans. °? Cardiac catheterization and angiogram. °How is this treated? °Treatment for this condition is aimed at managing the symptoms of heart failure. Medicines, behavioral changes, or other treatments may be necessary to treat heart failure. °Medicines °These may include: °· Angiotensin-converting enzyme (ACE) inhibitors. This type of medicine blocks the effects of a blood protein called angiotensin-converting enzyme. ACE inhibitors relax (dilate) the blood vessels and help to lower blood pressure. °· Angiotensin receptor blockers (ARBs). This type of medicine blocks the actions of a blood protein called angiotensin. ARBs dilate the blood vessels and help to lower blood pressure. °· Water pills (diuretics). Diuretics cause   the kidneys to remove salt and water from the blood. The extra fluid is removed through urination, leaving a lower volume of blood that the heart has to pump. °· Beta blockers. These improve heart muscle  strength and they prevent the heart from beating too quickly. °· Digoxin. This increases the force of the heartbeat. °Healthy behavior changes °These may include: °· Reaching and maintaining a healthy weight. °· Stopping smoking or chewing tobacco. °· Eating heart-healthy foods. °· Limiting or avoiding alcohol. °· Stopping use of street drugs (illegal drugs). °· Physical activity. °Other treatments °These may include: °· Surgery to open blocked coronary arteries or repair damaged heart valves. °· Placement of a biventricular pacemaker to improve heart muscle function (cardiac resynchronization therapy). This device paces both the right ventricle and left ventricle. °· Placement of a device to treat serious abnormal heart rhythms (implantable cardioverter defibrillator, or ICD). °· Placement of a device to improve the pumping ability of the heart (left ventricular assist device, or LVAD). °· Heart transplant. This can cure heart failure, and it is considered for certain patients who do not improve with other therapies. °Follow these instructions at home: °Medicines °· Take over-the-counter and prescription medicines only as told by your health care provider. Medicines are important in reducing the workload of your heart, slowing the progression of heart failure, and improving your symptoms. °? Do not stop taking your medicine unless your health care provider told you to do that. °? Do not skip any dose of medicine. °? Refill your prescriptions before you run out of medicine. You need your medicines every day. °Eating and drinking ° °· Eat heart-healthy foods. Talk with a dietitian to make an eating plan that is right for you. °? Choose foods that contain no trans fat and are low in saturated fat and cholesterol. Healthy choices include fresh or frozen fruits and vegetables, fish, lean meats, legumes, fat-free or low-fat dairy products, and whole-grain or high-fiber foods. °? Limit salt (sodium) if directed by your  health care provider. Sodium restriction may reduce symptoms of heart failure. Ask a dietitian to recommend heart-healthy seasonings. °? Use healthy cooking methods instead of frying. Healthy methods include roasting, grilling, broiling, baking, poaching, steaming, and stir-frying. °· Limit your fluid intake if directed by your health care provider. Fluid restriction may reduce symptoms of heart failure. °Lifestyle ° °· Stop smoking or using chewing tobacco. Nicotine and tobacco can damage your heart and your blood vessels. Do not use nicotine gum or patches before talking to your health care provider. °· Limit alcohol intake to no more than 1 drink per day for non-pregnant women and 2 drinks per day for men. One drink equals 12 oz of beer, 5 oz of wine, or 1½ oz of hard liquor. °? Drinking more than that is harmful to your heart. Tell your health care provider if you drink alcohol several times a week. °? Talk with your health care provider about whether any level of alcohol use is safe for you. °? If your heart has already been damaged by alcohol or you have severe heart failure, drinking alcohol should be stopped completely. °· Stop use of illegal drugs. °· Lose weight if directed by your health care provider. Weight loss may reduce symptoms of heart failure. °· Do moderate physical activity if directed by your health care provider. People who are elderly and people with severe heart failure should consult with a health care provider for physical activity recommendations. °Monitor important information ° °· Weigh   yourself every day. Keeping track of your weight daily helps you to notice excess fluid sooner. °? Weigh yourself every morning after you urinate and before you eat breakfast. °? Wear the same amount of clothing each time you weigh yourself. °? Record your daily weight. Provide your health care provider with your weight record. °· Monitor and record your blood pressure as told by your health care  provider. °· Check your pulse as told by your health care provider. °Dealing with extreme temperatures °· If the weather is extremely hot: °? Avoid vigorous physical activity. °? Use air conditioning or fans or seek a cooler location. °? Avoid caffeine and alcohol. °? Wear loose-fitting, lightweight, and light-colored clothing. °· If the weather is extremely cold: °? Avoid vigorous physical activity. °? Layer your clothes. °? Wear mittens or gloves, a hat, and a scarf when you go outside. °? Avoid alcohol. °General instructions °· Manage other health conditions such as hypertension, diabetes, thyroid disease, or abnormal heart rhythms as told by your health care provider. °· Learn to manage stress. If you need help to do this, ask your health care provider. °· Plan rest periods when fatigued. °· Get ongoing education and support as needed. °· Participate in or seek rehabilitation as needed to maintain or improve independence and quality of life. °· Stay up to date with immunizations. Keeping current on pneumococcal and influenza immunizations is especially important to prevent respiratory infections. °· Keep all follow-up visits as told by your health care provider. This is important. °Contact a health care provider if: °· You have a rapid weight gain. °· You have increasing shortness of breath that is unusual for you. °· You are unable to participate in your usual physical activities. °· You tire easily. °· You cough more than normal, especially with physical activity. °· You have any swelling or more swelling in areas such as your hands, feet, ankles, or abdomen. °· You are unable to sleep because it is hard to breathe. °· You feel like your heart is beating quickly (palpitations). °· You become dizzy or light-headed when you stand up. °Get help right away if: °· You have difficulty breathing. °· You notice or your family notices a change in your awareness, such as having trouble staying awake or having difficulty  with concentration. °· You have pain or discomfort in your chest. °· You have an episode of fainting (syncope). °This information is not intended to replace advice given to you by your health care provider. Make sure you discuss any questions you have with your health care provider. °Document Released: 11/02/2005 Document Revised: 10/01/2017 Document Reviewed: 05/27/2016 °Elsevier Interactive Patient Education © 2019 [employer] ° °

## 2018-11-12 ENCOUNTER — Telehealth: Payer: Self-pay

## 2018-11-12 LAB — CBC WITH DIFFERENTIAL/PLATELET
BASOS ABS: 0 10*3/uL (ref 0.0–0.2)
Basos: 1 %
EOS (ABSOLUTE): 0.1 10*3/uL (ref 0.0–0.4)
Eos: 3 %
Hematocrit: 34.3 % (ref 34.0–46.6)
Hemoglobin: 11.4 g/dL (ref 11.1–15.9)
Immature Grans (Abs): 0 10*3/uL (ref 0.0–0.1)
Immature Granulocytes: 0 %
LYMPHS ABS: 1.1 10*3/uL (ref 0.7–3.1)
Lymphs: 24 %
MCH: 32.9 pg (ref 26.6–33.0)
MCHC: 33.2 g/dL (ref 31.5–35.7)
MCV: 99 fL — ABNORMAL HIGH (ref 79–97)
MONOS ABS: 0.5 10*3/uL (ref 0.1–0.9)
Monocytes: 11 %
Neutrophils Absolute: 2.8 10*3/uL (ref 1.4–7.0)
Neutrophils: 61 %
Platelets: 280 10*3/uL (ref 150–450)
RBC: 3.46 x10E6/uL — AB (ref 3.77–5.28)
RDW: 11.9 % — AB (ref 12.3–15.4)
WBC: 4.6 10*3/uL (ref 3.4–10.8)

## 2018-11-12 LAB — BASIC METABOLIC PANEL
BUN / CREAT RATIO: 24 (ref 12–28)
BUN: 20 mg/dL (ref 8–27)
CHLORIDE: 104 mmol/L (ref 96–106)
CO2: 23 mmol/L (ref 20–29)
Calcium: 9.4 mg/dL (ref 8.7–10.3)
Creatinine, Ser: 0.84 mg/dL (ref 0.57–1.00)
GFR calc Af Amer: 73 mL/min/{1.73_m2} (ref >59)
GFR calc non Af Amer: 63 mL/min/{1.73_m2} (ref >59)
GLUCOSE: 62 mg/dL — AB (ref 65–99)
POTASSIUM: 4.4 mmol/L (ref 3.5–5.2)
SODIUM: 142 mmol/L (ref 134–144)

## 2018-11-12 LAB — BRAIN NATRIURETIC PEPTIDE: BNP: 55 pg/mL (ref 0.0–100.0)

## 2018-11-12 NOTE — Telephone Encounter (Signed)
Tried calling son, and no answer. Will try again later.

## 2018-11-12 NOTE — Telephone Encounter (Signed)
-----   Message from Margaretann LovelessJennifer M Burnette, PA-C sent at 11/12/2018 11:37 AM EST ----- Labs still ok. Kidney function good. Ok to increase furosemide to 40mg  (take 2 tabs of 20mg ) and take 2 potassium pills when on increased dose. Once swelling improves decrease back to 20mg .

## 2018-11-14 ENCOUNTER — Telehealth: Payer: Self-pay

## 2018-11-14 NOTE — Telephone Encounter (Signed)
Patient son Ethelene Brownsnthony returned call and was advised.

## 2018-11-14 NOTE — Telephone Encounter (Signed)
Patient son Anthony returned call and was advised. 

## 2018-11-14 NOTE — Telephone Encounter (Signed)
[email or web address] 

## 2018-11-14 NOTE — Telephone Encounter (Signed)
-----   Message from Margaretann LovelessJennifer M Burnette, PA-C sent at 11/12/2018  9:07 PM EST ----- BNP normal.

## 2018-11-25 ENCOUNTER — Telehealth: Payer: Self-pay | Admitting: Family Medicine

## 2018-11-25 NOTE — Telephone Encounter (Signed)
Grandson of pt calling to find out something pt can take over the counter for bloating and to release gas. Pt has an appt next week as well.  Please advise.  Thanks, [employer]

## 2018-11-25 NOTE — Telephone Encounter (Signed)
lmtcb

## 2018-11-25 NOTE — Telephone Encounter (Signed)
Patient advised as below. Patient verbalizes understanding and is in agreement with treatment plan.  

## 2018-11-25 NOTE — Telephone Encounter (Signed)
She can take OTC simethicone (Gas-X). If she is constipated then she could take OTC colace or Miralax.

## 2018-12-02 ENCOUNTER — Ambulatory Visit: Payer: Self-pay | Admitting: Family Medicine

## 2018-12-14 ENCOUNTER — Ambulatory Visit (INDEPENDENT_AMBULATORY_CARE_PROVIDER_SITE_OTHER): Payer: Medicare Other | Admitting: Family Medicine

## 2018-12-14 ENCOUNTER — Other Ambulatory Visit: Payer: Self-pay

## 2018-12-14 ENCOUNTER — Other Ambulatory Visit: Payer: Self-pay | Admitting: Family Medicine

## 2018-12-14 ENCOUNTER — Encounter: Payer: Self-pay | Admitting: Family Medicine

## 2018-12-14 DIAGNOSIS — I10 Essential (primary) hypertension: Secondary | ICD-10-CM

## 2018-12-14 DIAGNOSIS — R600 Localized edema: Secondary | ICD-10-CM

## 2018-12-14 MED ORDER — CHLORTHALIDONE 25 MG PO TABS: 25.0000 mg | ORAL_TABLET | Freq: Every day | ORAL | 1 refills | Status: DC

## 2018-12-14 NOTE — Progress Notes (Signed)
Patient: Maria Bush Female    DOB: 09-16-1930   85 y.o.   MRN: [id number] Visit Date: 12/14/2018  Today's Provider: Mila Merry, MD   No chief complaint on file.  Subjective:     HPI   Follow up for bilateral lower extremity edema  The patient was last seen for this 4 weeks ago by Rosemary Holms Changes made at last visit include increase furosemide to 40 mg QD and to take 2 potassium pills when on the increased dose and once swelling improves to decrease back to 20 mg QD.  Results for orders placed or performed in visit on 11/11/18  Basic Metabolic Panel (BMET)  Result Value Ref Range   Glucose 62 (L) 65 - 99 mg/dL   BUN 20 8 - 27 mg/dL   Creatinine, Ser 3.35 0.57 - 1.00 mg/dL   GFR calc non Af Amer 63 >59 mL/min/1.73   GFR calc Af Amer 73 >59 mL/min/1.73   BUN/Creatinine Ratio 24 12 - 28   Sodium 142 134 - 144 mmol/L   Potassium 4.4 3.5 - 5.2 mmol/L   Chloride 104 96 - 106 mmol/L   CO2 23 20 - 29 mmol/L   Calcium 9.4 8.7 - 10.3 mg/dL  CBC w/Diff/Platelet  Result Value Ref Range   WBC 4.6 3.4 - 10.8 x10E3/uL   RBC 3.46 (L) 3.77 - 5.28 x10E6/uL   Hemoglobin 11.4 11.1 - 15.9 g/dL   Hematocrit 45.6 25.6 - 46.6 %   MCV 99 (H) 79 - 97 fL   MCH 32.9 26.6 - 33.0 pg   MCHC 33.2 31.5 - 35.7 g/dL   RDW 38.9 (L) 37.3 - 42.8 %   Platelets 280 150 - 450 x10E3/uL  B Nat Peptide  Result Value Ref Range   BNP 55.0 0.0 - 100.0 pg/mL     She reports fair compliance with treatment. She feels that condition is Unchanged. She is not having side effects.   Pt's son reports that the swelling is still there but not as bad as 4 weeks ago.  He feels that pt may need to try the 40 mg of furosemide again.  ------------------------------------------------------------------------------------   Allergies  Allergen Reactions  . Cephalexin Shortness Of Breath  . Prednisone Nausea And Vomiting  . Sulfa Antibiotics Itching  . Penicillins Rash     Current  Outpatient Medications:  .  amLODipine (NORVASC) 5 MG tablet, Take by mouth., Disp: , Rfl:  .  aspirin EC 81 MG tablet, Take 81 mg by mouth daily., Disp: , Rfl:  .  atenolol (TENORMIN) 25 MG tablet, Take 1 tablet (25 mg total) by mouth daily., Disp: 90 tablet, Rfl: 4 .  Calcium Carbonate-Vitamin D3 600-400 MG-UNIT TABS, Take 1 tablet by mouth daily., Disp: 60 tablet, Rfl: 6 .  FEROSUL 325 (65 Fe) MG tablet, Take 1 tablet (325 mg total) by mouth daily., Disp: 90 tablet, Rfl: 4 .  furosemide (LASIX) 20 MG tablet, TAKE ONE (1) TABLET BY MOUTH EVERY DAY, Disp: 90 tablet, Rfl: 3 .  latanoprost (XALATAN) 0.005 % ophthalmic solution, Place 1 drop into both eyes at bedtime., Disp: , Rfl:  .  lisinopril (PRINIVIL,ZESTRIL) 20 MG tablet, TAKE 1 TABLET BY MOUTH ONCE A DAY, Disp: 90 tablet, Rfl: 5 .  loratadine (CLARITIN) 10 MG tablet, Take by mouth daily as needed. , Disp: , Rfl:  .  mirtazapine (REMERON) 30 MG tablet, Take by mouth., Disp: , Rfl:  .  mometasone (  ELOCON) 0.1 % cream, Apply 1 application topically daily. As needed for neck/posterior ear rash (Patient not taking: Reported on 11/11/2018), Disp: 45 g, Rfl: 0 .  omeprazole (PRILOSEC) 40 MG capsule, TAKE ONE (1) CAPSULE EACH DAY, Disp: 30 capsule, Rfl: 11 .  potassium chloride (K-DUR) 10 MEQ tablet, Take 1 tablet (10 mEq total) by mouth daily., Disp: 90 tablet, Rfl: 3 .  traMADol (ULTRAM) 50 MG tablet, Take 1 tablet (50 mg total) by mouth every 8 (eight) hours as needed., Disp: 30 tablet, Rfl: 3 .  Travoprost, BAK Free, (TRAVATAN Z) 0.004 % SOLN ophthalmic solution, Place 1 drop into both eyes at bedtime. , Disp: , Rfl:   Review of Systems  Constitutional: Negative.   HENT: Negative.   Eyes: Negative.   Respiratory: Negative.   Cardiovascular: Negative.   Gastrointestinal: Negative.   Endocrine: Negative.   Genitourinary: Negative.   Musculoskeletal: Negative.   Skin: Negative.   Allergic/Immunologic: Negative.   Neurological: Negative.     Hematological: Negative.   Psychiatric/Behavioral: Negative.     Social History   Tobacco Use  . Smoking status: Never Smoker  . Smokeless tobacco: Never Used  Substance Use Topics  . Alcohol use: No      Objective:   BP (!) 170/88 (BP Location: Right Arm, Patient Position: Sitting, Cuff Size: Normal)   Pulse (!) 57   Temp 97.9 F (36.6 C) (Oral)   Ht 5\' 2"  (1.575 m)   Wt 127 lb 9.6 oz (57.9 kg)   SpO2 98%   BMI 23.34 kg/m     Physical Exam  General appearance: alert, well developed, well nourished, cooperative and in no distress Head: Normocephalic, without obvious abnormality, atraumatic Respiratory: Respirations even and unlabored, normal respiratory rate Extremities: 1+ bipedal edeam.      Assessment & Plan    1. Essential hypertension Was prescribed hctz several years ago by Dr. Elease Hashimoto, but never fill because pharmacist told them it has sulfa in it. Counseled on low risk of cross reactivity with thiazides.  - chlorthalidone (HYGROTON) 25 MG tablet; Take 1 tablet (25 mg total) by mouth daily.  Dispense: 30 tablet; Refill: 1  2. Bilateral lower extremity edema Somewhat improved. Likely aggravated by CCB. add- chlorthalidone (HYGROTON) 25 MG tablet; Take 1 tablet (25 mg total) by mouth daily.  Dispense: 30 tablet; Refill: 1  Future Appointments  Date Time Provider Department Center  01/13/2019  4:20 PM Fisher, Demetrios Isaacs, MD [facility] None        Mila Merry, MD  [facility]

## 2018-12-14 NOTE — Patient Instructions (Signed)
.   Please review the attached list of medications and notify my office if there are any errors.   . Please bring all of your medications to every appointment so we can make sure that our medication list is the same as yours.   

## 2019-01-13 ENCOUNTER — Ambulatory Visit (INDEPENDENT_AMBULATORY_CARE_PROVIDER_SITE_OTHER): Payer: Medicare Other | Admitting: Family Medicine

## 2019-01-13 ENCOUNTER — Encounter: Payer: Self-pay | Admitting: Family Medicine

## 2019-01-13 VITALS — BP 142/78 | HR 60 | Temp 98.4°F | Resp 16 | Wt 126.0 lb

## 2019-01-13 DIAGNOSIS — F0390 Unspecified dementia without behavioral disturbance: Secondary | ICD-10-CM | POA: Diagnosis not present

## 2019-01-13 DIAGNOSIS — I10 Essential (primary) hypertension: Secondary | ICD-10-CM

## 2019-01-13 MED ORDER — POTASSIUM CHLORIDE ER 10 MEQ PO TBCR: 10.0000 meq | EXTENDED_RELEASE_TABLET | Freq: Every day | ORAL | 3 refills | Status: DC

## 2019-01-13 NOTE — Patient Instructions (Addendum)
.   Please review the attached list of medications and notify my office if there are any errors.   . Please bring all of your medications to every appointment so we can make sure that our medication list is the same as yours.   . Please go to the lab draw station in [address] on the second floor of [facility]. Normal hours are 8:00am to 12:30pm and 1:30pm to 4:00pm Monday through Friday   We might try a medicine call Aricept (donepezil) to help withy memory if the labs are normal

## 2019-01-13 NOTE — Progress Notes (Signed)
Patient: Maria Bush Female    DOB: 1930/10/28   85 y.o.   MRN: [id number] Visit Date: 01/13/2019  Today's Provider: Mila Merry, MD   Chief Complaint  Patient presents with  . Hypertension   Subjective:     HPI   Hypertension, follow-up:  BP Readings from Last 3 Encounters:  01/13/19 (!) 142/78  12/14/18 (!) 170/88  11/11/18 (!) 144/71    She was last seen for hypertension 1 months ago.  BP at that visit was 170/88. Management since that visit includes adding chlorthalidone 25mg  daily. She reports good compliance with treatment. She is not having side effects.  She is not exercising. She is adherent to low salt diet.   Outside blood pressures are not being checked. She is experiencing no side effects.  Patient denies exertional chest pressure/discomfort, lower extremity edema and palpitations.    Weight trend: stable Wt Readings from Last 3 Encounters:  01/13/19 126 lb (57.2 kg)  12/14/18 127 lb 9.6 oz (57.9 kg)  11/11/18 125 lb (56.7 kg)   She is here with her son today and reports that she has been increasingly confused and forgetful over the last couple of yeats. .   Allergies  Allergen Reactions  . Cephalexin Shortness Of Breath  . Prednisone Nausea And Vomiting  . Sulfa Antibiotics Itching  . Penicillins Rash     Current Outpatient Medications:  .  amLODipine (NORVASC) 5 MG tablet, Take by mouth., Disp: , Rfl:  .  aspirin EC 81 MG tablet, Take 81 mg by mouth daily., Disp: , Rfl:  .  atenolol (TENORMIN) 25 MG tablet, Take 1 tablet (25 mg total) by mouth daily., Disp: 90 tablet, Rfl: 4 .  Calcium Carbonate-Vitamin D3 600-400 MG-UNIT TABS, Take 1 tablet by mouth daily., Disp: 60 tablet, Rfl: 6 .  chlorthalidone (HYGROTON) 25 MG tablet, TAKE 1 TABLET(25 MG) BY MOUTH DAILY, Disp: 90 tablet, Rfl: 0 .  FEROSUL 325 (65 Fe) MG tablet, Take 1 tablet (325 mg total) by mouth daily., Disp: 90 tablet, Rfl: 4 .  furosemide (LASIX) 20 MG tablet, TAKE  ONE (1) TABLET BY MOUTH EVERY DAY, Disp: 90 tablet, Rfl: 3 .  latanoprost (XALATAN) 0.005 % ophthalmic solution, Place 1 drop into both eyes at bedtime., Disp: , Rfl:  .  lisinopril (PRINIVIL,ZESTRIL) 20 MG tablet, TAKE 1 TABLET BY MOUTH ONCE A DAY, Disp: 90 tablet, Rfl: 5 .  loratadine (CLARITIN) 10 MG tablet, Take by mouth daily as needed. , Disp: , Rfl:  .  mirtazapine (REMERON) 30 MG tablet, Take by mouth., Disp: , Rfl:  .  mometasone (ELOCON) 0.1 % cream, Apply 1 application topically daily. As needed for neck/posterior ear rash, Disp: 45 g, Rfl: 0 .  omeprazole (PRILOSEC) 40 MG capsule, TAKE ONE (1) CAPSULE EACH DAY, Disp: 30 capsule, Rfl: 11 .  potassium chloride (K-DUR) 10 MEQ tablet, Take 1 tablet (10 mEq total) by mouth daily., Disp: 90 tablet, Rfl: 3 .  traMADol (ULTRAM) 50 MG tablet, Take 1 tablet (50 mg total) by mouth every 8 (eight) hours as needed., Disp: 30 tablet, Rfl: 3 .  Travoprost, BAK Free, (TRAVATAN Z) 0.004 % SOLN ophthalmic solution, Place 1 drop into both eyes at bedtime. , Disp: , Rfl:   Review of Systems  Constitutional: Negative for activity change and fatigue.  Respiratory: Negative for cough and shortness of breath.   Cardiovascular: Negative for chest pain, palpitations and leg swelling.  Musculoskeletal: Positive for  arthralgias.  Neurological: Positive for headaches. Negative for dizziness.       Occasional headaches  Psychiatric/Behavioral: Negative.     Social History   Tobacco Use  . Smoking status: Never Smoker  . Smokeless tobacco: Never Used  Substance Use Topics  . Alcohol use: No      Objective:   BP (!) 142/78 (BP Location: Left Arm, Patient Position: Sitting, Cuff Size: Normal)   Pulse 60   Temp 98.4 F (36.9 C)   Resp 16   Wt 126 lb (57.2 kg)   SpO2 98%   BMI 23.05 kg/m  Vitals:   01/13/19 1629  BP: (!) 142/78  Pulse: 60  Resp: 16  Temp: 98.4 F (36.9 C)  SpO2: 98%  Weight: 126 lb (57.2 kg)     Physical  Exam   General Appearance:    Alert, cooperative, no distress  Eyes:    PERRL, conjunctiva/corneas clear, EOM's intact       Lungs:     Clear to auscultation bilaterally, respirations unlabored  Heart:    Regular rate and rhythm, 1+ bipedal edema.   Neurologic:   Awake, alert, oriented x 1. No apparent focal neurological           defect.       MMSE - Mini Mental State Exam 01/13/2019  Orientation to time 0  Orientation to Place 2  Registration 1  Attention/ Calculation 3  Recall 0  Language- name 2 objects 2  Language- repeat 1  Language- follow 3 step command 2  Language- read & follow direction 1  Write a sentence 0  Copy design 0  Total score 12        Assessment & Plan    1. Essential hypertension Much better .Continue current medications.  refill- potassium chloride (K-DUR) 10 MEQ tablet; Take 1 tablet (10 mEq total) by mouth daily.  Dispense: 90 tablet; Refill: 3 - Renal function panel  2. Dementia without behavioral disturbance, unspecified dementia type (HCC)  - Vitamin B12 - VITAMIN D 25 Hydroxy (Vit-D Deficiency, Fractures) - RPR  Discussed trial of donepezil. Her son would like to wait on results of labs. Mila Merry, MD  [facility]

## 2019-01-20 DIAGNOSIS — I10 Essential (primary) hypertension: Secondary | ICD-10-CM | POA: Diagnosis not present

## 2019-01-20 DIAGNOSIS — F0390 Unspecified dementia without behavioral disturbance: Secondary | ICD-10-CM | POA: Diagnosis not present

## 2019-01-21 LAB — RENAL FUNCTION PANEL
Albumin: 3.9 g/dL (ref 3.6–4.6)
BUN/Creatinine Ratio: 33 — ABNORMAL HIGH (ref 12–28)
BUN: 35 mg/dL — ABNORMAL HIGH (ref 8–27)
CO2: 24 mmol/L (ref 20–29)
Calcium: 9.2 mg/dL (ref 8.7–10.3)
Chloride: 102 mmol/L (ref 96–106)
Creatinine, Ser: 1.07 mg/dL — ABNORMAL HIGH (ref 0.57–1.00)
GFR, EST AFRICAN AMERICAN: 54 mL/min/{1.73_m2} — AB (ref >59)
GFR, EST NON AFRICAN AMERICAN: 47 mL/min/{1.73_m2} — AB (ref >59)
GLUCOSE: 92 mg/dL (ref 65–99)
PHOSPHORUS: 3.8 mg/dL (ref 3.0–4.3)
Potassium: 4.9 mmol/L (ref 3.5–5.2)
SODIUM: 140 mmol/L (ref 134–144)

## 2019-01-21 LAB — VITAMIN D 25 HYDROXY (VIT D DEFICIENCY, FRACTURES): Vit D, 25-Hydroxy: 24.4 ng/mL — ABNORMAL LOW (ref 30.0–100.0)

## 2019-01-21 LAB — VITAMIN B12: VITAMIN B 12: 354 pg/mL (ref 232–1245)

## 2019-01-21 LAB — RPR: RPR: NONREACTIVE

## 2019-01-23 ENCOUNTER — Telehealth: Payer: Self-pay

## 2019-01-23 ENCOUNTER — Other Ambulatory Visit: Payer: Self-pay | Admitting: Family Medicine

## 2019-01-23 DIAGNOSIS — F0390 Unspecified dementia without behavioral disturbance: Secondary | ICD-10-CM

## 2019-01-23 DIAGNOSIS — I10 Essential (primary) hypertension: Secondary | ICD-10-CM

## 2019-01-23 MED ORDER — DONEPEZIL HCL 5 MG PO TABS: 5.0000 mg | ORAL_TABLET | Freq: Every day | ORAL | 2 refills | Status: DC

## 2019-01-23 MED ORDER — LISINOPRIL 20 MG PO TABS: 20.0000 mg | ORAL_TABLET | Freq: Every day | ORAL | 3 refills | Status: DC

## 2019-01-23 NOTE — Telephone Encounter (Signed)
Pt's son is requesting a refill on lisinopril 20 mg to [facility] [address].

## 2019-01-23 NOTE — Addendum Note (Signed)
Addended by: Mila Merry E on: 01/23/2019 10:00 AM   Modules accepted: Orders

## 2019-02-05 ENCOUNTER — Other Ambulatory Visit: Payer: Self-pay | Admitting: Family Medicine

## 2019-02-05 DIAGNOSIS — R600 Localized edema: Secondary | ICD-10-CM

## 2019-02-05 DIAGNOSIS — I10 Essential (primary) hypertension: Secondary | ICD-10-CM

## 2019-03-07 ENCOUNTER — Other Ambulatory Visit: Payer: Self-pay | Admitting: Family Medicine

## 2019-03-09 DIAGNOSIS — I10 Essential (primary) hypertension: Secondary | ICD-10-CM | POA: Diagnosis not present

## 2019-03-09 DIAGNOSIS — I059 Rheumatic mitral valve disease, unspecified: Secondary | ICD-10-CM | POA: Diagnosis not present

## 2019-03-09 DIAGNOSIS — R079 Chest pain, unspecified: Secondary | ICD-10-CM | POA: Diagnosis not present

## 2019-03-09 DIAGNOSIS — E782 Mixed hyperlipidemia: Secondary | ICD-10-CM | POA: Diagnosis not present

## 2019-03-13 ENCOUNTER — Ambulatory Visit (INDEPENDENT_AMBULATORY_CARE_PROVIDER_SITE_OTHER): Payer: Medicare Other | Admitting: Family Medicine

## 2019-03-13 ENCOUNTER — Other Ambulatory Visit: Payer: Self-pay

## 2019-03-13 ENCOUNTER — Encounter: Payer: Self-pay | Admitting: Family Medicine

## 2019-03-13 VITALS — BP 132/70 | HR 58 | Temp 98.6°F | Resp 16 | Wt 124.0 lb

## 2019-03-13 DIAGNOSIS — S39012A Strain of muscle, fascia and tendon of lower back, initial encounter: Secondary | ICD-10-CM | POA: Diagnosis not present

## 2019-03-13 NOTE — Patient Instructions (Signed)
.   Please review the attached list of medications and notify my office if there are any errors.   . Take 200mg  ibuprofen caplets every eight hours for the next 5-6 days to help with pain and inflammation in your back   Alternate applying heat and ice every four hours for the next 5-6 days. Apply ice pack for about 10 minutes, then apply heat for 10 minutes four hours later, and so on and son on.

## 2019-03-13 NOTE — Progress Notes (Signed)
Patient: Maria Bush Female    DOB: [birth year]   85 y.o.   MRN: [id number] Visit Date: 03/13/2019  Today's Provider: Mila Merry, MD   Chief Complaint  Patient presents with  . Back Pain   Subjective:     Back Pain  This is a recurrent problem. The current episode started yesterday. The problem occurs intermittently. The problem is unchanged. The pain is present in the lumbar spine. The quality of the pain is described as aching. The pain does not radiate. The pain is moderate (can be severe). The pain is the same all the time. The symptoms are aggravated by lying down and standing. Pertinent negatives include no chest pain. She has tried NSAIDs for the symptoms. The treatment provided mild relief.  Has mostly resolved today. Recalls no injury.    Allergies  Allergen Reactions  . Cephalexin Shortness Of Breath  . Prednisone Nausea And Vomiting  . Sulfa Antibiotics Itching  . Penicillins Rash     Current Outpatient Medications:  .  amLODipine (NORVASC) 5 MG tablet, Take by mouth., Disp: , Rfl:  .  aspirin EC 81 MG tablet, Take 81 mg by mouth daily., Disp: , Rfl:  .  atenolol (TENORMIN) 25 MG tablet, Take 1 tablet (25 mg total) by mouth daily., Disp: 90 tablet, Rfl: 4 .  Calcium Carbonate-Vitamin D3 600-400 MG-UNIT TABS, Take 1 tablet by mouth daily., Disp: 60 tablet, Rfl: 6 .  chlorthalidone (HYGROTON) 25 MG tablet, TAKE 1 TABLET(25 MG) BY MOUTH DAILY, Disp: 30 tablet, Rfl: 12 .  donepezil (ARICEPT) 5 MG tablet, Take 1 tablet (5 mg total) by mouth at bedtime., Disp: 30 tablet, Rfl: 2 .  FEROSUL 325 (65 Fe) MG tablet, Take 1 tablet (325 mg total) by mouth daily., Disp: 90 tablet, Rfl: 4 .  furosemide (LASIX) 20 MG tablet, TAKE ONE (1) TABLET BY MOUTH EVERY DAY, Disp: 90 tablet, Rfl: 3 .  latanoprost (XALATAN) 0.005 % ophthalmic solution, Place 1 drop into both eyes at bedtime., Disp: , Rfl:  .  lisinopril (PRINIVIL,ZESTRIL) 20 MG tablet, Take 1 tablet (20 mg total)  by mouth daily., Disp: 90 tablet, Rfl: 3 .  loratadine (CLARITIN) 10 MG tablet, Take by mouth daily as needed. , Disp: , Rfl:  .  mirtazapine (REMERON) 30 MG tablet, Take by mouth., Disp: , Rfl:  .  mometasone (ELOCON) 0.1 % cream, Apply 1 application topically daily. As needed for neck/posterior ear rash, Disp: 45 g, Rfl: 0 .  omeprazole (PRILOSEC) 40 MG capsule, TAKE ONE CAPSULE BY MOUTH DAILY, Disp: 30 capsule, Rfl: 11 .  potassium chloride (K-DUR) 10 MEQ tablet, Take 1 tablet (10 mEq total) by mouth daily., Disp: 90 tablet, Rfl: 3 .  traMADol (ULTRAM) 50 MG tablet, Take 1 tablet (50 mg total) by mouth every 8 (eight) hours as needed., Disp: 30 tablet, Rfl: 3 .  Travoprost, BAK Free, (TRAVATAN Z) 0.004 % SOLN ophthalmic solution, Place 1 drop into both eyes at bedtime. , Disp: , Rfl:   Review of Systems  Constitutional: Positive for activity change. Negative for appetite change, chills, diaphoresis and fatigue.  Cardiovascular: Negative for chest pain, palpitations and leg swelling.  Musculoskeletal: Positive for arthralgias and back pain. Negative for joint swelling and myalgias.    Social History   Tobacco Use  . Smoking status: Never Smoker  . Smokeless tobacco: Never Used  Substance Use Topics  . Alcohol use: No      Objective:  BP 132/70 (BP Location: Left Arm, Patient Position: Sitting, Cuff Size: Normal)   Pulse (!) 58   Temp 98.6 F (37 C)   Resp 16   Wt 124 lb (56.2 kg)   SpO2 98%   BMI 22.68 kg/m  Vitals:   03/13/19 1514  BP: 132/70  Pulse: (!) 58  Resp: 16  Temp: 98.6 F (37 C)  SpO2: 98%  Weight: 124 lb (56.2 kg)     Physical Exam  General appearance: alert, well developed, well nourished, cooperative and in no distress Head: Normocephalic, without obvious abnormality, atraumatic Respiratory: Respirations even and unlabored, normal respiratory rate Extremities: No gross deformities MS: Slight tenderness right para lumbar muscles.     Assessment &  Plan     1. Strain of lumbar region, initial encounter Mostly resolved today. Discussed conservative treatment.  Call if symptoms change or if not rapidly improving.   Was also prescribed donepezil  Last month, but her son reports she hasn't really started taking it consistently yet.     Mila Merryonald Lela Murfin, MD  [facility]

## 2019-04-18 ENCOUNTER — Ambulatory Visit: Payer: Self-pay | Admitting: Family Medicine

## 2019-04-18 ENCOUNTER — Ambulatory Visit: Payer: Medicare Other | Admitting: Family Medicine

## 2019-04-27 DIAGNOSIS — H524 Presbyopia: Secondary | ICD-10-CM | POA: Diagnosis not present

## 2019-04-27 DIAGNOSIS — H5213 Myopia, bilateral: Secondary | ICD-10-CM | POA: Diagnosis not present

## 2019-04-27 DIAGNOSIS — H401213 Low-tension glaucoma, right eye, severe stage: Secondary | ICD-10-CM | POA: Diagnosis not present

## 2019-04-27 DIAGNOSIS — H52221 Regular astigmatism, right eye: Secondary | ICD-10-CM | POA: Diagnosis not present

## 2019-05-05 ENCOUNTER — Ambulatory Visit (INDEPENDENT_AMBULATORY_CARE_PROVIDER_SITE_OTHER): Payer: Medicare Other | Admitting: Family Medicine

## 2019-05-05 VITALS — BP 136/70 | HR 58 | Temp 98.1°F | Resp 16 | Wt 127.0 lb

## 2019-05-05 DIAGNOSIS — I10 Essential (primary) hypertension: Secondary | ICD-10-CM

## 2019-05-05 DIAGNOSIS — F0390 Unspecified dementia without behavioral disturbance: Secondary | ICD-10-CM

## 2019-05-05 MED ORDER — DONEPEZIL HCL 5 MG PO TABS: 5.0000 mg | ORAL_TABLET | Freq: Every day | ORAL | 3 refills | Status: DC

## 2019-05-05 NOTE — Patient Instructions (Signed)
Please go to the lab draw station in [address] on the second floor of [facility]. Normal hours are 8:00am to 12:30pm and 1:30pm to 4:00pm Monday through Friday  . Please review the attached list of medications and notify my office if there are any errors.   . Please bring all of your medications to every appointment so we can make sure that our medication list is the same as yours.   

## 2019-05-05 NOTE — Progress Notes (Signed)
Patient: Maria LodgeMargaret L Bush Female    DOB: 1930/07/23   85 y.o.   MRN: [id number] Visit Date: 05/05/2019  Today's Provider: Mila Merryonald Renesmee Raine, MD   Chief Complaint  Patient presents with  . Hypertension  . Dementia   Subjective:   HPI  Hypertension, follow-up:  BP Readings from Last 3 Encounters:  05/05/19 136/70  03/13/19 132/70  01/13/19 (!) 142/78    She was last seen for hypertension 4 months ago.  BP at that visit was 142/78. Management since that visit includes no changes. She reports good compliance with treatment. She is not having side effects.  She is not exercising. She is not adherent to low salt diet.   Outside blood pressures are not being checked. She is experiencing none.  Patient denies exertional chest pressure/discomfort, lower extremity edema and palpitations.    Weight trend: stable Wt Readings from Last 3 Encounters:  05/05/19 127 lb (57.6 kg)  03/13/19 124 lb (56.2 kg)  01/13/19 126 lb (57.2 kg)    Current diet: well balanced  Dementia, follow up: Patient was last seen in the office 4 months ago. Since last visit, she was started on Donepezil 5mg  daily. Patient reports that she has not started the medication yet.   Allergies  Allergen Reactions  . Cephalexin Shortness Of Breath  . Prednisone Nausea And Vomiting  . Sulfa Antibiotics Itching  . Penicillins Rash     Current Outpatient Medications:  .  amLODipine (NORVASC) 5 MG tablet, Take by mouth., Disp: , Rfl:  .  aspirin EC 81 MG tablet, Take 81 mg by mouth daily., Disp: , Rfl:  .  atenolol (TENORMIN) 25 MG tablet, Take 1 tablet (25 mg total) by mouth daily., Disp: 90 tablet, Rfl: 4 .  Calcium Carbonate-Vitamin D3 600-400 MG-UNIT TABS, Take 1 tablet by mouth daily., Disp: 60 tablet, Rfl: 6 .  chlorthalidone (HYGROTON) 25 MG tablet, TAKE 1 TABLET(25 MG) BY MOUTH DAILY, Disp: 30 tablet, Rfl: 12 .  donepezil (ARICEPT) 5 MG tablet, Take 1 tablet (5 mg total) by mouth at bedtime.,  Disp: 30 tablet, Rfl: 2 .  FEROSUL 325 (65 Fe) MG tablet, Take 1 tablet (325 mg total) by mouth daily., Disp: 90 tablet, Rfl: 4 .  furosemide (LASIX) 20 MG tablet, TAKE ONE (1) TABLET BY MOUTH EVERY DAY, Disp: 90 tablet, Rfl: 3 .  latanoprost (XALATAN) 0.005 % ophthalmic solution, Place 1 drop into both eyes at bedtime., Disp: , Rfl:  .  lisinopril (PRINIVIL,ZESTRIL) 20 MG tablet, Take 1 tablet (20 mg total) by mouth daily., Disp: 90 tablet, Rfl: 3 .  loratadine (CLARITIN) 10 MG tablet, Take by mouth daily as needed. , Disp: , Rfl:  .  mirtazapine (REMERON) 30 MG tablet, Take by mouth., Disp: , Rfl:  .  mometasone (ELOCON) 0.1 % cream, Apply 1 application topically daily. As needed for neck/posterior ear rash, Disp: 45 g, Rfl: 0 .  omeprazole (PRILOSEC) 40 MG capsule, TAKE ONE CAPSULE BY MOUTH DAILY, Disp: 30 capsule, Rfl: 11 .  potassium chloride (K-DUR) 10 MEQ tablet, Take 1 tablet (10 mEq total) by mouth daily., Disp: 90 tablet, Rfl: 3 .  traMADol (ULTRAM) 50 MG tablet, Take 1 tablet (50 mg total) by mouth every 8 (eight) hours as needed., Disp: 30 tablet, Rfl: 3 .  Travoprost, BAK Free, (TRAVATAN Z) 0.004 % SOLN ophthalmic solution, Place 1 drop into both eyes at bedtime. , Disp: , Rfl:   Review of Systems  Constitutional: Negative.   Respiratory: Negative.   Cardiovascular: Negative.   Musculoskeletal: Negative.   Skin: Negative.   Neurological: Negative.     Social History   Tobacco Use  . Smoking status: Never Smoker  . Smokeless tobacco: Never Used  Substance Use Topics  . Alcohol use: No      Objective:   BP 136/70 (BP Location: Left Arm, Patient Position: Sitting, Cuff Size: Normal)   Pulse (!) 58   Temp 98.1 F (36.7 C)   Resp 16   Wt 127 lb (57.6 kg)   BMI 23.23 kg/m  Vitals:   05/05/19 1606  BP: 136/70  Pulse: (!) 58  Resp: 16  Temp: 98.1 F (36.7 C)  Weight: 127 lb (57.6 kg)     Physical Exam   General Appearance:    Alert, cooperative, no distress   Eyes:    PERRL, conjunctiva/corneas clear, EOM's intact       Lungs:     Clear to auscultation bilaterally, respirations unlabored  Heart:    Regular rate and rhythm, 1+ pedal edema  Neurologic:   Awake, alert, oriented x 3. No apparent focal neurological           defect.           Assessment & Plan    1. Dementia without behavioral disturbance, unspecified dementia type ([place]) Has not yet started - donepezil (ARICEPT) 5 MG tablet; Take 1 tablet (5 mg total) by mouth at bedtime.  Dispense: 30 tablet; Refill: 3 New prescription sent to her pharmacy today. Anticipate scheduling follow up in a few months after reviewing her lab results.   2. Essential hypertension Well controlled.  Continue current medications.   - Renal function panel  The entirety of the information documented in the History of Present Illness, Review of Systems and Physical Exam were personally obtained by me. Portions of this information were initially documented by Wilburt Finlay, CMA and reviewed by me for thoroughness and accuracy.      Lelon Huh, MD  [facility]

## 2019-05-10 DIAGNOSIS — D500 Iron deficiency anemia secondary to blood loss (chronic): Secondary | ICD-10-CM | POA: Diagnosis not present

## 2019-05-10 DIAGNOSIS — I10 Essential (primary) hypertension: Secondary | ICD-10-CM | POA: Diagnosis not present

## 2019-05-11 LAB — RENAL FUNCTION PANEL
Albumin: 3.8 g/dL (ref 3.6–4.6)
BUN/Creatinine Ratio: 26 (ref 12–28)
BUN: 28 mg/dL — ABNORMAL HIGH (ref 8–27)
CO2: 23 mmol/L (ref 20–29)
Calcium: 9.3 mg/dL (ref 8.7–10.3)
Chloride: 103 mmol/L (ref 96–106)
Creatinine, Ser: 1.06 mg/dL — ABNORMAL HIGH (ref 0.57–1.00)
GFR calc Af Amer: 55 mL/min/{1.73_m2} — ABNORMAL LOW (ref >59)
GFR calc non Af Amer: 47 mL/min/{1.73_m2} — ABNORMAL LOW (ref >59)
Glucose: 125 mg/dL — ABNORMAL HIGH (ref 65–99)
Phosphorus: 4.1 mg/dL (ref 3.0–4.3)
Potassium: 4.4 mmol/L (ref 3.5–5.2)
Sodium: 141 mmol/L (ref 134–144)

## 2019-05-12 ENCOUNTER — Telehealth: Payer: Self-pay

## 2019-05-12 NOTE — Telephone Encounter (Signed)
Pt's son Maria Bush advised.  Apt made for 07/17/2019  Thanks,   -Mickel Baas

## 2019-05-12 NOTE — Telephone Encounter (Signed)
-----   Message from Birdie Sons, MD sent at 05/11/2019  8:55 AM EDT ----- Labs are good. Continue current medications.  Can start donepezil prescription if she hasn't already. Need to follow up about 2 months after starting medication

## 2019-05-18 ENCOUNTER — Telehealth: Payer: Self-pay | Admitting: Family Medicine

## 2019-05-18 NOTE — Chronic Care Management (AMB) (Signed)
°  Chronic Care Management   Outreach Note  05/18/2019 Name: Maria Bush MRN: [id number] DOB: [birth year]  Referred by: Birdie Sons, MD Reason for referral : Chronic Care Management (Initial CCM outreach was unsuccessful. )   An unsuccessful telephone outreach was attempted today. The patient was referred to the case management team by for assistance with chronic care management and care coordination.   Follow Up Plan: A HIPPA compliant phone message was left for the patient providing contact information and requesting a return call.  The care management team will reach out to the patient again over the next 7 days.  If patient returns call to provider office, please advise to call [place] at [place]  [email or web address]   ??[phone]

## 2019-05-26 NOTE — Chronic Care Management (AMB) (Signed)
Chronic Care Management   Note  05/26/2019 Name: Maria Bush MRN: [id number] DOB: 04/17/1930  [name] is a 85 y.o. year old female who is a primary care patient of Fisher, Kirstie Peri, MD. I reached out to [name] by phone today in response to a referral sent by Ms. Vicenta Dunning Kuna's health plan.    Ms. [place] was given information about Chronic Care Management services today including:  1. CCM service includes personalized support from designated clinical staff supervised by her physician, including individualized plan of care and coordination with other care providers 2. 24/7 contact phone numbers for assistance for urgent and routine care needs. 3. Service will only be billed when office clinical staff spend 20 minutes or more in a month to coordinate care. 4. Only one practitioner may furnish and bill the service in a calendar month. 5. The patient may stop CCM services at any time (effective at the end of the month) by phone call to the office staff. 6. The patient will be responsible for cost sharing (co-pay) of up to 20% of the service fee (after annual deductible is met).  Patients son Ruey Storer agreed to services and verbal consent obtained.   Follow up plan: Telephone appointment with CCM team member scheduled for: 06/20/2019  [place]  [email or web address]   ??[phone]

## 2019-06-20 ENCOUNTER — Other Ambulatory Visit: Payer: Self-pay

## 2019-06-20 ENCOUNTER — Ambulatory Visit: Payer: Medicare Other

## 2019-06-20 DIAGNOSIS — F0390 Unspecified dementia without behavioral disturbance: Secondary | ICD-10-CM

## 2019-06-20 DIAGNOSIS — I10 Essential (primary) hypertension: Secondary | ICD-10-CM

## 2019-06-20 DIAGNOSIS — E7800 Pure hypercholesterolemia, unspecified: Secondary | ICD-10-CM

## 2019-06-20 NOTE — Chronic Care Management (AMB) (Signed)
Chronic Care Management   Initial Visit Note  06/21/2019 Name: Maria Bush MRN: [id number] DOB: 18-Nov-1929  Subjective: per son "We are doing OK with her health but there are some things I need to do to make sure she is ok when I have to return to work"  Objective:  BP Readings from Last 3 Encounters:  05/05/19 136/70  03/13/19 132/70  01/13/19 (!) 142/78   Lab Results  Component Value Date   CHOL 152 01/29/2016   HDL 55 01/29/2016   LDLCALC 89 01/29/2016   TRIG 40 01/29/2016   No results found for: HGBA1C   Advanced Directives 06/20/2019  Does Patient Have a Medical Advance Directive? No  Type of Advance Directive -  Does patient want to make changes to medical advance directive? -  Copy of [place] in Chart? -  Would patient like information on creating a medical advance directive? Yes (MAU/Ambulatory/Procedural Areas - Information given)    Assessment: Maria Bush is an 85 year old female patient of Dr. Lelon Huh who was referred to chronic case management by her health plan. She has a history of but not limited to HTN, CAD, GERD, and Hypercholesteremia.  Review of patient status, including review of consultants reports, relevant laboratory and other test results, and collaboration with appropriate care team members and the patient's provider was performed as part of comprehensive patient evaluation and provision of chronic care management services.      Goals Addressed            This Visit's Progress   . per son "I am able to assist her everyday but I may have to go back to the office soon       Mr. Hadsall (son and live in caregiver) states his mother is doing well for her age although she is experiencing some memory loss. She has been prescribed Donepezil but per son they have not picked up from pharmacy yet. He is currently working from home secondary to covid pandemic but states he may have to go back to the office soon. Currently  he assists patient with meal prep, medications. Patient is able to take her meds once they are "laid out for her". She receives meals on wheels but does not eat. She prefers to cook but needs assistance.   Current Barriers:  Marland Kitchen Knowledge deficit related to knowing community resources for elder care  Nurse Case Manager Clinical Goal(s):  Marland Kitchen Over the next 30 days, son will call [place] to explore options for in home sitter and support  Interventions:  . Provided patient and/or caregiver with contact  information for [place] [facility]  Patient Self Care Activities:  . Currently UNABLE TO independently take medications without prompting, prepare meals, attend medical appointment without assistance  Initial goal documentation     . per son "I really need to be her healthcare POA but I don't know how to do that (pt-stated)       Patient does not have AD. Son states mother is currently able to make decisions and feels she is competent to complete AD. Request RN CM send documents via mail so he and patient can complete.   Current Barriers:  . Limited education about the importance of naming a healthcare power of attorney . Cognitive Barriers  Clinical Social Work Clinical Goal(s):  Marland Kitchen Over the next 30 days, patient will verbalize basic understanding of Advanced Directives and importance of completion . Over the next 45 days,  the patient will have Advance Directive notarized and provide a copy to his provider   Interventions: . Interviewed patient about Designer, industrial/productAdvanced Directives and provided education about the importance of completing advanced directives . Patient's son interviewed and appropriate assessments performed . Mailed the patient/son an EMMI educational handout on Advance Directives as well as an Engineer, productionAdvance Directive packet  Patient Self Care Activities:  . Self administers medications as prescribed . Attends all scheduled provider appointments . Performs ADL's independently  . Calls provider office for new concerns or questions  Initial goal documentation      . per son "mom does not have any prescription coverage" (pt-stated)       Son is currently paying for patient's medications out of pocket. States he spends approximately 50.00/month however he has not picked up her Aricept. States patient has Medicare A and B, and a [employer] police but unsure what [employer] provides.   Current Barriers:  Marland Kitchen. Knowledge deficit related to understanding Medicare benefits for prescription drug coverage  Nurse Case Manager Clinical Goal(s):  Marland Kitchen. Over the next 30 days, patient/son will explore prescription drug coverage options by utilizing [employer]  Interventions:  . Provided patient/son with contact information for [facility] [place] and encouraged him to call . Encouraged patient to call [employer] to discuss patients benefits and to discuss their part D plan offered beginning 2019.  Patient Self Care Activities:  . Patient/son will contact [employer]   Initial goal documentation         Follow up plan:   Telephone follow up appointment with care management team member scheduled for: 07/11/2019 at 3:30  Will [facility] Pharmacist for medication discrepancies discovered during med rec   Maria Bush E. Maria PortelaPayne, RN, BSN Nurse Care Coordinator [facility] Care Management [phone]

## 2019-06-21 NOTE — Patient Instructions (Signed)
Thank you allowing the Chronic Care Management Team to be a part of your care! It was a pleasure speaking with you today!  1. Please make appointment with [facility] office at [phone] to discuss prescription drug plans that may be appropriate for you 2. Please call [employer] to discuss part D coverage as they now have that plan as of 2019. You also my want to discuss what her current long term plan covers as it may provide in home assistance. 3. Enclosed you will find Advanced Directives documents. This is ONLY for HEALTHCARE. If you need full power of attorney, you may need to discuss with an elder law attorney. 4. Please call [facility] to discuss services you may benefit from including demential support. They may have an elder law attorney they can recommend.     [place]   [place]   [email or web address]  CCM (Chronic Care Management) Team   Trish Fountain RN, BSN Nurse Care Coordinator  [phone]  Ruben Reason PharmD  Clinical Pharmacist  [phone]   Elliot Gurney, LCSW Clinical Social Worker [phone]  Goals Addressed            This Visit's Progress   . per son "I am able to assist her everyday but I may have to go back to the office soon       Current Barriers:  Marland Kitchen Knowledge deficit related to knowing community resources for elder care  Nurse Case Manager Clinical Goal(s):  Marland Kitchen Over the next 30 days, son will call [place] to explore options for in home sitter and support  Interventions:  . Provided patient and/or caregiver with contact  information for [place] [facility]  Patient Self Care Activities:  . Currently UNABLE TO independently take medications without prompting, prepare meals, attend medical appointment without assistance  Initial goal documentation     . per son "I really need to be her healthcare POA but I don't know how to do that (pt-stated)       Current Barriers:   . Limited education about the importance of naming a healthcare power of attorney . Cognitive Barriers (patient)  Clinical Social Work Clinical Goal(s):  Marland Kitchen Over the next 30 days, patient will verbalize basic understanding of Advanced Directives and importance of completion . Over the next 45 days, the patient will have Advance Directive notarized and provide a copy to his provider   Interventions: . Interviewed patient about Financial controller and provided education about the importance of completing advanced directives . Patient's son interviewed and appropriate assessments performed . Mailed the patient/son an EMMI educational handout on Advance Directives as well as an Emergency planning/management officer  Patient Self Care Activities:  . Self administers medications as prescribed . Attends all scheduled provider appointments . Performs ADL's independently . Calls provider office for new concerns or questions  Initial goal documentation      . per son "mom does not have any prescription coverage" (pt-stated)       Current Barriers:  Marland Kitchen Knowledge deficit related to understanding Medicare benefits for prescription drug coverage  Nurse Case Manager Clinical Goal(s):  Marland Kitchen Over the next 30 days, patient/son will explore prescription drug coverage options by utilizing [employer]IIP  Interventions:  . Provided patient/son with contact information for [facility] office [facility] and encouraged him to call  Patient Self Care Activities:  . Patient/son will contact [employer]IIP   Initial goal documentation  Print copy of patient instructions provided.   Telephone follow up appointment with care management team member scheduled for:  SYMPTOMS OF A STROKE   You have any symptoms of stroke. "BE FAST" is an easy way to remember the main warning signs: ? B - Balance. Signs are dizziness, sudden trouble walking, or loss of balance. ? E - Eyes. Signs are trouble seeing or a sudden change in how you  see. ? F - Face. Signs are sudden weakness or loss of feeling of the face, or the face or eyelid drooping on one side. ? A - Arms. Signs are weakness or loss of feeling in an arm. This happens suddenly and usually on one side of the body. ? S - Speech. Signs are sudden trouble speaking, slurred speech, or trouble understanding what people say. ? T - Time. Time to call emergency services. Write down what time symptoms started.  You have other signs of stroke, such as: ? A sudden, very bad headache with no known cause. ? Feeling sick to your stomach (nausea). ? Throwing up (vomiting). ? Jerky movements you cannot control (seizure).  SYMPTOMS OF A HEART ATTACK  What are the signs or symptoms? Symptoms of this condition include:  Chest pain. It may feel like: ? Crushing or squeezing. ? Tightness, pressure, fullness, or heaviness.  Pain in the arm, neck, jaw, back, or upper body.  Shortness of breath.  Heartburn.  Indigestion.  Nausea.  Cold sweats.  Feeling tired.  Sudden lightheadedness.

## 2019-06-26 ENCOUNTER — Ambulatory Visit: Payer: Self-pay | Admitting: Pharmacist

## 2019-06-26 DIAGNOSIS — F0390 Unspecified dementia without behavioral disturbance: Secondary | ICD-10-CM

## 2019-06-26 DIAGNOSIS — I10 Essential (primary) hypertension: Secondary | ICD-10-CM

## 2019-06-26 DIAGNOSIS — E7800 Pure hypercholesterolemia, unspecified: Secondary | ICD-10-CM

## 2019-06-26 NOTE — Chronic Care Management (AMB) (Signed)
  Chronic Care Management   Note  06/26/2019 Name: Maria Bush MRN: [id number] DOB: 12-16-1929  Received internal referral from [place] for CCM clinical pharmacy services. Telephone outreach to Maria Bush son, Maria Bush, to introduce pharmacy services. HIPAA identifiers verified.  Set up CCM initial pharmacy visit for August 13 at 9 am.   Goals Addressed            This Visit's Progress   . per son "mom does not have any prescription coverage" (pt-stated)       Current Barriers:  Marland Kitchen Knowledge deficit related to understanding Medicare benefits for prescription drug coverage  Nurse Case Manager Clinical Goal(s):  Marland Kitchen Over the next 30 days, patient/son will explore prescription drug coverage options by utilizing [employer]IIP  Interventions:  . Provided patient/son with contact information for [facility] and encouraged him to call . Updated 8/10: engaged with CCM pharmacist, set up initial pharmacy consult .   Patient Self Care Activities:  . Patient/son will contact [employer]P   Please see past updates related to this goal by clicking on the "Past Updates" button in the selected goal          Follow up plan: Telephone follow up appointment with care management team member scheduled for: August 13 at 9 am  Maria Bush, [place] [phone]

## 2019-06-29 ENCOUNTER — Ambulatory Visit (INDEPENDENT_AMBULATORY_CARE_PROVIDER_SITE_OTHER): Payer: Medicare Other | Admitting: Pharmacist

## 2019-06-29 DIAGNOSIS — E7800 Pure hypercholesterolemia, unspecified: Secondary | ICD-10-CM

## 2019-06-29 DIAGNOSIS — F0390 Unspecified dementia without behavioral disturbance: Secondary | ICD-10-CM

## 2019-06-29 DIAGNOSIS — I10 Essential (primary) hypertension: Secondary | ICD-10-CM | POA: Diagnosis not present

## 2019-06-29 NOTE — Chronic Care Management (AMB) (Signed)
  Chronic Care Management   Note  06/29/2019 Name: RENETA NIEHAUS MRN: [id number] DOB: [birth year]  Received internal referral from [place] for CCM clinical pharmacy services. Telephone outreach to Curt Bears son, Dalynn Jhaveri, to for initial pharmacy consult. HIPAA identifiers verified. .   Goals Addressed            This Visit's Progress   . per son "mom does not have any prescription coverage" (pt-stated)       Current Barriers:  Marland Kitchen Knowledge deficit related to understanding Medicare benefits for prescription drug coverage  Nurse Case Manager Clinical Goal(s):  Marland Kitchen Over the next 30 days, patient/son will explore prescription drug coverage options by utilizing SHIIP  Interventions:  . Provided patient/son with contact information for [facility] and encouraged him to call . Updated 8/10: engaged with CCM pharmacist, set up initial pharmacy consult . Updated 8/13: provided education surrounding different parts of Medicare, why Medicare part D or Part C "Advantage" plans are necessary and different from supplements (the [employer] card) and what benefits she can receive from each part  Patient Self Care Activities:  . Patient/son will contact [employer]P   Please see past updates related to this goal by clicking on the "Past Updates" button in the selected goal          Follow up plan: Telephone follow up appointment with care management team member scheduled for: two weeks with pharmacist   Ruben Reason, PharmD Clinical Pharmacist [place] [phone]

## 2019-07-05 NOTE — Progress Notes (Signed)
Subjective:   Maria Bush is a 85 y.o. female who presents for Medicare Annual (Subsequent) preventive examination.    This visit is being conducted through telemedicine due to the COVID-19 pandemic. This patient has given me verbal consent via doximity to conduct this visit, patient states they are participating from their home address. Some vital signs may be absent or patient reported.    Patient identification: identified by name, DOB, and current address  Review of Systems:  N/A  Cardiac Risk Factors include: advanced age (>4555men, 29>65 women);hypertension     Objective:     Vitals: There were no vitals taken for this visit.  There is no height or weight on file to calculate BMI. Unable to obtain vitals due to visit being conducted via telephonically.   Advanced Directives 07/06/2019 06/20/2019 08/06/2018 07/04/2018 06/03/2017 03/30/2017 03/26/2017  Does Patient Have a Medical Advance Directive? Yes No Yes Yes;No Yes No -  Type of Advance Directive Living will;Healthcare Power of Attorney - Living will - Living will - Living will  Does patient want to make changes to medical advance directive? - - Yes (ED - Information included in AVS) Yes (MAU/Ambulatory/Procedural Areas - Information given) - - -  Copy of Healthcare Power of Attorney in Chart? No - copy requested - - - - - -  Would patient like information on creating a medical advance directive? - Yes (MAU/Ambulatory/Procedural Areas - Information given) - - - - -    Tobacco Social History   Tobacco Use  Smoking Status Never Smoker  Smokeless Tobacco Never Used     Counseling given: Not Answered   Clinical Intake:  Pre-visit preparation completed: Yes  Pain : No/denies pain Pain Score: 0-No pain     Nutritional Risks: Nausea/ vomitting/ diarrhea(Diarrhea occasionally)  How often do you need to have someone help you when you read instructions, pamphlets, or other written materials from your doctor or pharmacy?:  1 - Never  Interpreter Needed?: No  Information entered by :: [facility] Hospital[facility]markoski, LPN  Past Medical History:  Diagnosis Date  . Acute blood loss anemia   . Arthritis 2005  . Diverticulosis   . GI bleed   . Heart murmur 2005  . Hypertension   . Memory loss    Past Surgical History:  Procedure Laterality Date  . ABDOMINAL HYSTERECTOMY  1981  . BLADDER SURGERY  1992  . BREAST BIOPSY  1992  . CARDIAC CATHETERIZATION  2011  . CATARACT EXTRACTION Bilateral 2015  . CHOLECYSTECTOMY  08/09/2000  . COLONOSCOPY  1982  . DILATION AND CURETTAGE OF UTERUS  1979  . GIVENS CAPSULE STUDY N/A 03/27/2017   Procedure: GIVENS CAPSULE STUDY;  Surgeon: Bernette RedbirdBuccini, Robert, MD;  [facility];  Service: Endoscopy;  Laterality: N/A;  . RECTOCELE REPAIR  1994   Family History  Problem Relation Age of Onset  . Breast cancer Sister   . Early death Mother   . Stroke Father   . Heart disease Brother   . Heart attack Brother   . Heart disease Brother   . Heart attack Brother    Social History   Socioeconomic History  . Marital status: Widowed    Spouse name: Not on file  . Number of children: 2  . Years of education: HS  . Highest education level: High school graduate  Occupational History  . Occupation: Retired    Comment: Public relations account executiveMill Worker  Social Needs  . Financial resource strain: Not hard at all  . Food  insecurity    Worry: Never true    Inability: Never true  . Transportation needs    Medical: No    Non-medical: No  Tobacco Use  . Smoking status: Never Smoker  . Smokeless tobacco: Never Used  Substance and Sexual Activity  . Alcohol use: No  . Drug use: No  . Sexual activity: Not on file  Lifestyle  . Physical activity    Days per week: 0 days    Minutes per session: 0 min  . Stress: Not at all  Relationships  . Social Musician on phone: Patient refused    Gets together: Patient refused    Attends religious service: Patient refused    Active member of club or  organization: Patient refused    Attends meetings of clubs or organizations: Patient refused    Relationship status: Patient refused  Other Topics Concern  . Not on file  Social History Narrative   Pt lives in [place]. Her daughter lives with her, with 3 grandchildren 67, 67, 12. Her daughter is a Research scientist (medical). Pt takes care of her grandchildren at home.     Outpatient Encounter Medications as of 07/06/2019  Medication Sig  . atenolol (TENORMIN) 25 MG tablet Take 1 tablet (25 mg total) by mouth daily.  . Calcium Carbonate-Vitamin D3 600-400 MG-UNIT TABS Take 1 tablet by mouth daily.  . chlorthalidone (HYGROTON) 25 MG tablet TAKE 1 TABLET(25 MG) BY MOUTH DAILY  . FEROSUL 325 (65 Fe) MG tablet Take 1 tablet (325 mg total) by mouth daily.  . furosemide (LASIX) 20 MG tablet TAKE ONE (1) TABLET BY MOUTH EVERY DAY  . latanoprost (XALATAN) 0.005 % ophthalmic solution Place 1 drop into both eyes at bedtime.  Marland Kitchen lisinopril (PRINIVIL,ZESTRIL) 20 MG tablet Take 1 tablet (20 mg total) by mouth daily.  Marland Kitchen omeprazole (PRILOSEC) 40 MG capsule TAKE ONE CAPSULE BY MOUTH DAILY (Patient taking differently: Take 20 mg by mouth daily. )  . potassium chloride (K-DUR) 10 MEQ tablet Take 1 tablet (10 mEq total) by mouth daily.  . traMADol (ULTRAM) 50 MG tablet Take 1 tablet (50 mg total) by mouth every 8 (eight) hours as needed.  Marland Kitchen amLODipine (NORVASC) 5 MG tablet Take 5 mg by mouth daily.   Marland Kitchen aspirin EC 81 MG tablet Take 81 mg by mouth daily.  Marland Kitchen donepezil (ARICEPT) 5 MG tablet Take 1 tablet (5 mg total) by mouth at bedtime. (Patient not taking: Reported on 06/20/2019)  . loratadine (CLARITIN) 10 MG tablet Take by mouth daily as needed.   . mirtazapine (REMERON) 30 MG tablet Take by mouth.  . mometasone (ELOCON) 0.1 % cream Apply 1 application topically daily. As needed for neck/posterior ear rash (Patient not taking: Reported on 06/20/2019)  . Travoprost, BAK Free, (TRAVATAN Z) 0.004 % SOLN ophthalmic solution  Place 1 drop into both eyes at bedtime.    No facility-administered encounter medications on file as of 07/06/2019.     Activities of Daily Living In your present state of health, do you have any difficulty performing the following activities: 07/06/2019  Hearing? N  Vision? N  Comment Wears eye glasses.  Difficulty concentrating or making decisions? Y  Comment Currently changing medications for memory loss.  Walking or climbing stairs? Y  Comment Due to knee pains.  Dressing or bathing? N  Doing errands, shopping? Y  Comment Does not drive.  Preparing Food and eating ? Y  Comment Son does the cooking.  Using  the Toilet? N  In the past six months, have you accidently leaked urine? Y  Comment Occasionally, wears depends daily.  Do you have problems with loss of bowel control? Y  Comment Occasionally, wears depends daily.  Managing your Medications? Y  Comment Son manages medications.  Managing your Finances? Y  Comment Son manages finances.  Housekeeping or managing your Housekeeping? Y  Comment Does minimal cleaning.  Some recent data might be hidden    Patient Care Team: Malva LimesFisher, Donald E, MD as PCP - General (Family Medicine) Oscar LaPayne, Portia E, RN as Registered Nurse Andee PolesHedrick, Julie E, [facility] Regional Medical CenterRPH (Pharmacist)    Assessment:   This is a routine wellness examination for [facility].  Exercise Activities and Dietary recommendations Current Exercise Habits: The patient does not participate in regular exercise at present, Exercise limited by: orthopedic condition(s)  Goals      Patient Stated   . per son "I really need to be her healthcare POA but I don't know how to do that (pt-stated)     Current Barriers:  . Limited education about the importance of naming a healthcare power of attorney . Cognitive Barriers  Clinical Social Work Clinical Goal(s):  Marland Kitchen. Over the next 30 days, patient will verbalize basic understanding of Advanced Directives and importance of completion . Over the  next 45 days, the patient will have Advance Directive notarized and provide a copy to his provider   Interventions: . Interviewed patient about Designer, industrial/productAdvanced Directives and provided education about the importance of completing advanced directives . Patient's son interviewed and appropriate assessments performed . Mailed the patient/son an EMMI educational handout on Advance Directives as well as an Engineer, productionAdvance Directive packet  Patient Self Care Activities:  . Self administers medications as prescribed . Attends all scheduled provider appointments . Performs ADL's independently . Calls provider office for new concerns or questions  Initial goal documentation      . per son "mom does not have any prescription coverage" (pt-stated)     Current Barriers:  Marland Kitchen. Knowledge deficit related to understanding Medicare benefits for prescription drug coverage  Nurse Case Manager Clinical Goal(s):  Marland Kitchen. Over the next 30 days, patient/son will explore prescription drug coverage options by utilizing SHIIP  Interventions:  . Prov[facility] [place] and encouraged him to call . Updated 8/10: engaged with CCM pharmacist, set up initial pharmacy consult . Updated 8/13: provided education surrounding different parts of Medicare, why Medicare part D or Part C "Advantage" plans are necessary and different from supplements (the [employer] card) and what benefits she can receive from each part  Patient Self Care Activities:  . Patient/son will contact [employer]P   Please see past updates related to this goal by clicking on the "Past Updates" button in the selected goal        Other   . Increase water intake     Recommend increasing water intake to 4-6 glasses of water a day.     . per son "I am able to assist her everyday but I may have to go back to the office soon     Current Barriers:  Marland Kitchen. Knowledge deficit related to knowing community resources for elder care   Nurse Case Manager Clinical Goal(s):  Marland Kitchen. Over the next 30 days, son will call [facility] to explore options for in home sitter and support  Interventions:  . Provided patient and/or caregiver with contact  information for [facility] [facility]  Patient Self  Care Activities:  . Currently UNABLE TO independently take medications without prompting, prepare meals, attend medical appointment without assistance  Initial goal documentation        Fall Risk: Fall Risk  07/06/2019 12/14/2018 07/04/2018 06/03/2017 01/24/2016  Falls in the past year? 0 0 No Yes Yes  Number falls in past yr: - - - 1 2 or more  Injury with Fall? - - - No No  Risk Factor Category  - - - - -  Follow up - - - Falls prevention discussed -    FALL RISK PREVENTION PERTAINING TO THE HOME:  Any stairs in or around the home? No  If so, are there any without handrails? N/A  Home free of loose throw rugs in walkways, pet beds, electrical cords, etc? Yes  Adequate lighting in your home to reduce risk of falls? Yes   ASSISTIVE DEVICES UTILIZED TO PREVENT FALLS:  Life alert? No  Use of a cane, walker or w/c? Yes  Grab bars in the bathroom? Yes  Shower chair or bench in shower? No  Elevated toilet seat or a handicapped toilet? Yes    TIMED UP AND GO:  Was the test performed? No .    Depression Screen PHQ 2/9 Scores 07/06/2019 12/14/2018 07/04/2018 06/03/2017  PHQ - 2 Score 0 0 0 0  PHQ- 9 Score - - - 2     Cognitive Function: Not due currently.  MMSE - Mini Mental State Exam 01/13/2019  Orientation to time 0  Orientation to Place 2  Registration 1  Attention/ Calculation 3  Recall 0  Language- name 2 objects 2  Language- repeat 1  Language- follow 3 step command 2  Language- read & follow direction 1  Write a sentence 0  Copy design 0  Total score 12     6CIT Screen 06/03/2017  What Year? 0 points  What month? 0 points  What time? 0 points  Count back from 20 2 points  Months in reverse 4  points  Repeat phrase 10 points  Total Score 16    Immunization History  Administered Date(s) Administered  . Influenza, High Dose Seasonal PF 09/24/2015, 09/25/2017  . Pneumococcal Conjugate-13 09/24/2015  . Pneumococcal Polysaccharide-23 09/29/2004  . Td 07/22/2007    Qualifies for Shingles Vaccine? Yes . Due for Shingrix. Education has been provided regarding the importance of this vaccine. Pt has been advised to call insurance company to determine out of pocket expense. Advised may also receive vaccine at local pharmacy or Health Dept. Verbalized acceptance and understanding.  Tdap: Although this vaccine is not a covered service during a Wellness Exam, does the patient still wish to receive this vaccine today?  No .   Flu Vaccine: Due fall 2020  Pneumococcal Vaccine: Completed series  Screening Tests Health Maintenance  Topic Date Due  . TETANUS/TDAP  07/21/2017  . DEXA SCAN  02/06/2018  . INFLUENZA VACCINE  06/17/2019  . PNA vac Low Risk Adult  Completed    Cancer Screenings:  Colorectal Screening: No longer required.   Mammogram: No longer required.   Bone Density: Completed 02/06/13. Results reflect  OSTEOPENIA. Repeat every 5 years. Declined order today. Son to speak with PCP about this further at next in office apt.   Lung Cancer Screening: (Low Dose CT Chest recommended if Age 74-80 years, 30 pack-year currently smoking OR have quit w/in 15years.) does not qualify.   Additional Screening:  Vision Screening: Recommended annual ophthalmology exams for early detection of  glaucoma and other disorders of the eye.  Dental Screening: Recommended annual dental exams for proper oral hygiene  Community Resource Referral:  CRR required this visit?  No       Plan:  I have personally reviewed and addressed the Medicare Annual Wellness questionnaire and have noted the following in the patient's chart:  A. Medical and social history B. Use of alcohol, tobacco or  illicit drugs  C. Current medications and supplements D. Functional ability and status E.  Nutritional status F.  Physical activity G. Advance directives H. List of other physicians I.  Hospitalizations, surgeries, and ER visits in previous 12 months J.  [place] such as hearing and vision if needed, cognitive and depression L. Referrals and appointments   In addition, I have reviewed and discussed with patient certain preventive protocols, quality metrics, and best practice recommendations. A written personalized care plan for preventive services as well as general preventive health recommendations were provided to patient. Nurse Health Advisor  Signed,    Kamali Sakata [place], [place]  9/40/7680 Nurse Health Advisor   Nurse Notes: Pt to receive the influenza vaccine and speak with Dr Caryn Section about DEXA scan further at next in office visit.

## 2019-07-06 ENCOUNTER — Other Ambulatory Visit: Payer: Self-pay

## 2019-07-06 ENCOUNTER — Ambulatory Visit (INDEPENDENT_AMBULATORY_CARE_PROVIDER_SITE_OTHER): Payer: Medicare Other

## 2019-07-06 DIAGNOSIS — Z0000 Encounter for general adult medical examination without abnormal findings: Secondary | ICD-10-CM

## 2019-07-06 NOTE — Patient Instructions (Signed)
Maria Bush , Thank you for taking time to come for your Medicare Wellness Visit. I appreciate your ongoing commitment to your health goals. Please review the following plan we discussed and let me know if I can assist you in the future.   Screening recommendations/referrals: Colonoscopy: No longer required.  Mammogram: No longer required.  Bone Density: Declined order today. Son to speak with PCP about DEXA further Recommended yearly ophthalmology/optometry visit for glaucoma screening and checkup Recommended yearly dental visit for hygiene and checkup  Vaccinations: Influenza vaccine: Due fall 2020 Pneumococcal vaccine: Completed series Tdap vaccine: Pt declines today.  Shingles vaccine: Pt declines today.     Advanced directives: Please bring a copy of your POA (Power of Attorney) and/or Living Will to your next appointment.   Conditions/risks identified: Continue to increase water intake to to 6-8 8 oz glasses a day.  Next appointment: 07/17/19 @ 1:40 PM with Dr Caryn Section.   Preventive Care 20 Years and Older, Female Preventive care refers to lifestyle choices and visits with your health care provider that can promote health and wellness. What does preventive care include?  A yearly physical exam. This is also called an annual well check.  Dental exams once or twice a year.  Routine eye exams. Ask your health care provider how often you should have your eyes checked.  Personal lifestyle choices, including:  Daily care of your teeth and gums.  Regular physical activity.  Eating a healthy diet.  Avoiding tobacco and drug use.  Limiting alcohol use.  Practicing safe sex.  Taking low-dose aspirin every day.  Taking vitamin and mineral supplements as recommended by your health care provider. What happens during an annual well check? The services and screenings done by your health care provider during your annual well check will depend on your age, overall health,  lifestyle risk factors, and family history of disease. Counseling  Your health care provider may ask you questions about your:  Alcohol use.  Tobacco use.  Drug use.  Emotional well-being.  Home and relationship well-being.  Sexual activity.  Eating habits.  History of falls.  Memory and ability to understand (cognition).  Work and work Statistician.  Reproductive health. Screening  You may have the following tests or measurements:  Height, weight, and BMI.  Blood pressure.  Lipid and cholesterol levels. These may be checked every 5 years, or more frequently if you are over 35 years old.  Skin check.  Lung cancer screening. You may have this screening every year starting at age 49 if you have a 30-pack-year history of smoking and currently smoke or have quit within the past 15 years.  Fecal occult blood test (FOBT) of the stool. You may have this test every year starting at age 25.  Flexible sigmoidoscopy or colonoscopy. You may have a sigmoidoscopy every 5 years or a colonoscopy every 10 years starting at age 26.  Hepatitis C blood test.  Hepatitis B blood test.  Sexually transmitted disease (STD) testing.  Diabetes screening. This is done by checking your blood sugar (glucose) after you have not eaten for a while (fasting). You may have this done every 1-3 years.  Bone density scan. This is done to screen for osteoporosis. You may have this done starting at age 21.  Mammogram. This may be done every 1-2 years. Talk to your health care provider about how often you should have regular mammograms. Talk with your health care provider about your test results, treatment options, and if necessary,  the need for more tests. Vaccines  Your health care provider may recommend certain vaccines, such as:  Influenza vaccine. This is recommended every year.  Tetanus, diphtheria, and acellular pertussis (Tdap, Td) vaccine. You may need a Td booster every 10 years.  Zoster  vaccine. You may need this after age 85.  Pneumococcal 13-valent conjugate (PCV13) vaccine. One dose is recommended after age 48.  Pneumococcal polysaccharide (PPSV23) vaccine. One dose is recommended after age 83. Talk to your health care provider about which screenings and vaccines you need and how often you need them. This information is not intended to replace advice given to you by your health care provider. Make sure you discuss any questions you have with your health care provider. Document Released: 11/29/2015 Document Revised: 07/22/2016 Document Reviewed: 09/03/2015 Elsevier Interactive Patient Education  2017 [employer] Prevention in the Home Falls can cause injuries. They can happen to people of all ages. There are many things you can do to make your home safe and to help prevent falls. What can I do on the outside of my home?  Regularly fix the edges of walkways and driveways and fix any cracks.  Remove anything that might make you trip as you walk through a door, such as a raised step or threshold.  Trim any bushes or trees on the path to your home.  Use bright outdoor lighting.  Clear any walking paths of anything that might make someone trip, such as rocks or tools.  Regularly check to see if handrails are loose or broken. Make sure that both sides of any steps have handrails.  Any raised decks and porches should have guardrails on the edges.  Have any leaves, snow, or ice cleared regularly.  Use sand or salt on walking paths during winter.  Clean up any spills in your garage right away. This includes oil or grease spills. What can I do in the bathroom?  Use night lights.  Install grab bars by the toilet and in the tub and shower. Do not use towel bars as grab bars.  Use non-skid mats or decals in the tub or shower.  If you need to sit down in the shower, use a plastic, non-slip stool.  Keep the floor dry. Clean up any water that spills on the  floor as soon as it happens.  Remove soap buildup in the tub or shower regularly.  Attach bath mats securely with double-sided non-slip rug tape.  Do not have throw rugs and other things on the floor that can make you trip. What can I do in the bedroom?  Use night lights.  Make sure that you have a light by your bed that is easy to reach.  Do not use any sheets or blankets that are too big for your bed. They should not hang down onto the floor.  Have a firm chair that has side arms. You can use this for support while you get dressed.  Do not have throw rugs and other things on the floor that can make you trip. What can I do in the kitchen?  Clean up any spills right away.  Avoid walking on wet floors.  Keep items that you use a lot in easy-to-reach places.  If you need to reach something above you, use a strong step stool that has a grab bar.  Keep electrical cords out of the way.  Do not use floor polish or wax that makes floors slippery. If you must use  wax, use non-skid floor wax.  Do not have throw rugs and other things on the floor that can make you trip. What can I do with my stairs?  Do not leave any items on the stairs.  Make sure that there are handrails on both sides of the stairs and use them. Fix handrails that are broken or loose. Make sure that handrails are as long as the stairways.  Check any carpeting to make sure that it is firmly attached to the stairs. Fix any carpet that is loose or worn.  Avoid having throw rugs at the top or bottom of the stairs. If you do have throw rugs, attach them to the floor with carpet tape.  Make sure that you have a light switch at the top of the stairs and the bottom of the stairs. If you do not have them, ask someone to add them for you. What else can I do to help prevent falls?  Wear shoes that:  Do not have high heels.  Have rubber bottoms.  Are comfortable and fit you well.  Are closed at the toe. Do not wear  sandals.  If you use a stepladder:  Make sure that it is fully opened. Do not climb a closed stepladder.  Make sure that both sides of the stepladder are locked into place.  Ask someone to hold it for you, if possible.  Clearly mark and make sure that you can see:  Any grab bars or handrails.  First and last steps.  Where the edge of each step is.  Use tools that help you move around (mobility aids) if they are needed. These include:  Canes.  Walkers.  Scooters.  Crutches.  Turn on the lights when you go into a dark area. Replace any light bulbs as soon as they burn out.  Set up your furniture so you have a clear path. Avoid moving your furniture around.  If any of your floors are uneven, fix them.  If there are any pets around you, be aware of where they are.  Review your medicines with your doctor. Some medicines can make you feel dizzy. This can increase your chance of falling. Ask your doctor what other things that you can do to help prevent falls. This information is not intended to replace advice given to you by your health care provider. Make sure you discuss any questions you have with your health care provider. Document Released: 08/29/2009 Document Revised: 04/09/2016 Document Reviewed: 12/07/2014 Elsevier Interactive Patient Education  2017 [employer].

## 2019-07-07 ENCOUNTER — Ambulatory Visit: Payer: Medicare Other

## 2019-07-11 ENCOUNTER — Ambulatory Visit: Payer: Medicare Other

## 2019-07-11 ENCOUNTER — Other Ambulatory Visit: Payer: Self-pay

## 2019-07-11 DIAGNOSIS — I10 Essential (primary) hypertension: Secondary | ICD-10-CM | POA: Diagnosis not present

## 2019-07-11 DIAGNOSIS — E7800 Pure hypercholesterolemia, unspecified: Secondary | ICD-10-CM | POA: Diagnosis not present

## 2019-07-11 DIAGNOSIS — F0390 Unspecified dementia without behavioral disturbance: Secondary | ICD-10-CM

## 2019-07-11 NOTE — Patient Instructions (Signed)
Thank you allowing the Chronic Care Management Team to be a part of your care! It was a pleasure speaking with you today!  1. Please contact [place] office for assistance with medicare prescription coverage plans 2. [employer], corporate for inhome caregiver resources since you have longterm care insurance 3. Please ask at [facility] if they have Elder Law resources for you to get more information on Dual POA  CCM (Chronic Care Management) Team   Trish Fountain RN, BSN Nurse Care Coordinator  [phone]  Ruben Reason PharmD  Clinical Pharmacist  [phone]   Elliot Gurney, LCSW Clinical Social Worker [phone]  Goals Addressed            This Visit's Progress   . per son "I am able to assist her everyday but I may have to go back to the office soon   Not on track    Current Barriers:  Marland Kitchen Knowledge deficit related to knowing community resources for elder care  Nurse Case Manager Clinical Goal(s):  Marland Kitchen Over the next 30 days, son will call [facility] to explore options for in home sitter and support  Interventions:  . Again provided son/caregiver with contact information for [employer] and encouraged him to call to explore caregiver options  Patient Self Care Activities:  . Currently UNABLE TO independently take medications without prompting, prepare meals, attend medical appointment without assistance  Please see past updates in goals section for documentation of goal progression       . per son "I really need to be her healthcare POA but I don't know how to do that (pt-stated)   On track    Current Barriers:  . Limited education about the importance of naming a healthcare power of attorney . Cognitive Barriers  Clinical Social Work Clinical Goal(s):  Marland Kitchen Over the next 30 days, patient will verbalize basic understanding of Advanced Directives and importance of completion . Over the next 45 days, the patient will have Advance  Directive notarized and provide a copy to his provider   Interventions: . Assessed for receipt of Advanced Directives packet . Discussed difference between HCPOA and POA . Provided patient's son with contact information for [employer] for Elder law resource  Patient Self Care Activities:  . Self administers medications as prescribed . Attends all scheduled provider appointments . Performs ADL's independently . Calls provider office for new concerns or questions  Please see past updates related to this goal by clicking on the "Past Updates" button in the selected goal       . per son "mom does not have any prescription coverage" (pt-stated)   Not on track    Current Barriers:  Marland Kitchen Knowledge deficit related to understanding Medicare benefits for prescription drug coverage  Nurse Case Manager Clinical Goal(s):  Marland Kitchen Over the next 30 days, patient/son will explore prescription drug coverage options by utilizing [facility]  Interventions:  . Provided patient/son with contact information for [facility] and encouraged him to call . Updated 8/10: engaged with CCM pharmacist, set up initial pharmacy consult . Updated 8/13: provided education surrounding different parts of Medicare, why Medicare part D or Part C "Advantage" plans are necessary and different from supplements (the [employer] card) and what benefits she can receive from each part  Patient Self Care Activities:  . Patient/son will contact [facility]P   Please see past updates related to this goal by clicking on the "Past Updates" button in the selected  goal         The patient verbalized understanding of instructions provided today and declined a print copy of patient instruction materials.   The care management team will reach out to the patient again over the next 60 days.  The patient has been provided with contact information for the care management team and has been advised to call with any health related  questions or concerns.   SYMPTOMS OF A STROKE   You have any symptoms of stroke. "BE FAST" is an easy way to remember the main warning signs: ? B - Balance. Signs are dizziness, sudden trouble walking, or loss of balance. ? E - Eyes. Signs are trouble seeing or a sudden change in how you see. ? F - Face. Signs are sudden weakness or loss of feeling of the face, or the face or eyelid drooping on one side. ? A - Arms. Signs are weakness or loss of feeling in an arm. This happens suddenly and usually on one side of the body. ? S - Speech. Signs are sudden trouble speaking, slurred speech, or trouble understanding what people say. ? T - Time. Time to call emergency services. Write down what time symptoms started.  You have other signs of stroke, such as: ? A sudden, very bad headache with no known cause. ? Feeling sick to your stomach (nausea). ? Throwing up (vomiting). ? Jerky movements you cannot control (seizure).  SYMPTOMS OF A HEART ATTACK  What are the signs or symptoms? Symptoms of this condition include:  Chest pain. It may feel like: ? Crushing or squeezing. ? Tightness, pressure, fullness, or heaviness.  Pain in the arm, neck, jaw, back, or upper body.  Shortness of breath.  Heartburn.  Indigestion.  Nausea.  Cold sweats.  Feeling tired.  Sudden lightheadedness.

## 2019-07-11 NOTE — Chronic Care Management (AMB) (Signed)
Chronic Care Management   Follow Up Note   07/11/2019 Name: Maria Bush MRN: [id number] DOB: Oct 12, 1930  Referred by: Birdie Sons, MD Reason for referral : Chronic Care Management (follow up)   Maria Bush is a 85 y.o. year old female who is a primary care patient of Fisher, Kirstie Peri, MD. The CCM team was consulted for assistance with chronic disease management and care coordination needs.    Review of patient status, including review of consultants reports, relevant laboratory and other test results, and collaboration with appropriate care team members and the patient's provider was performed as part of comprehensive patient evaluation and provision of chronic care management services.    SDOH (Social Determinants of Health) screening performed today: None. See Care Plan for related entries.   Outpatient Encounter Medications as of 07/11/2019  Medication Sig  . amLODipine (NORVASC) 5 MG tablet Take 5 mg by mouth daily.   Marland Kitchen aspirin EC 81 MG tablet Take 81 mg by mouth daily.  Marland Kitchen atenolol (TENORMIN) 25 MG tablet Take 1 tablet (25 mg total) by mouth daily.  . Calcium Carbonate-Vitamin D3 600-400 MG-UNIT TABS Take 1 tablet by mouth daily.  . chlorthalidone (HYGROTON) 25 MG tablet TAKE 1 TABLET(25 MG) BY MOUTH DAILY  . donepezil (ARICEPT) 5 MG tablet Take 1 tablet (5 mg total) by mouth at bedtime. (Patient not taking: Reported on 06/20/2019)  . FEROSUL 325 (65 Fe) MG tablet Take 1 tablet (325 mg total) by mouth daily.  . furosemide (LASIX) 20 MG tablet TAKE ONE (1) TABLET BY MOUTH EVERY DAY  . latanoprost (XALATAN) 0.005 % ophthalmic solution Place 1 drop into both eyes at bedtime.  Marland Kitchen lisinopril (PRINIVIL,ZESTRIL) 20 MG tablet Take 1 tablet (20 mg total) by mouth daily.  Marland Kitchen loratadine (CLARITIN) 10 MG tablet Take by mouth daily as needed.   . mirtazapine (REMERON) 30 MG tablet Take by mouth.  . mometasone (ELOCON) 0.1 % cream Apply 1 application topically daily. As needed for  neck/posterior ear rash (Patient not taking: Reported on 06/20/2019)  . omeprazole (PRILOSEC) 40 MG capsule TAKE ONE CAPSULE BY MOUTH DAILY (Patient taking differently: Take 20 mg by mouth daily. )  . potassium chloride (K-DUR) 10 MEQ tablet Take 1 tablet (10 mEq total) by mouth daily.  . traMADol (ULTRAM) 50 MG tablet Take 1 tablet (50 mg total) by mouth every 8 (eight) hours as needed.  . Travoprost, BAK Free, (TRAVATAN Z) 0.004 % SOLN ophthalmic solution Place 1 drop into both eyes at bedtime.    No facility-administered encounter medications on file as of 07/11/2019.      Goals Addressed            This Visit's Progress   . per son "I am able to assist her everyday but I may have to go back to the office soon   Not on track    Son continues to work from home and able to provide care/assistance to patient. He has not contacted Museum/gallery curator for possible caregiver resources.   Current Barriers:  Marland Kitchen Knowledge deficit related to knowing community resources for elder care  Nurse Case Manager Clinical Goal(s):  Marland Kitchen Over the next 30 days, son will call [facility] to explore options for in home sitter and support  Interventions:  . Again provided son/caregiver with contact information for [employer] and encouraged him to call to explore caregiver options  Patient Self Care Activities:  . Currently UNABLE TO independently take medications without  prompting, prepare meals, attend medical appointment without assistance  Please see past updates in goals section for documentation of goal progression       . per son "I really need to be her healthcare POA but I don't know how to do that (pt-stated)   On track    Son admits to receiving Advanced Directives in the mail and hopes to have completed by Monday. He also expresses need to have dual POA as he currently attneds to all patients finances but nothing is in writing that this is OK. Patient is able to currently consent to  her son taking care of her bills, but he is afraid the time will come when her dementia will prevent her from consenting. He would like resources for information on Dual POA and how to obtain one.   Current Barriers:  . Limited education about the importance of naming a healthcare power of attorney . Cognitive Barriers  Clinical Social Work Clinical Goal(s):  Marland Kitchen. Over the next 30 days, patient will verbalize basic understanding of Advanced Directives and importance of completion . Over the next 45 days, the patient will have Advance Directive notarized and provide a copy to his provider   Interventions: . Assessed for receipt of Advanced Directives packet . Discussed difference between HCPOA and POA . Provided patient's son with contact information for [employer] [facility] for Elder law resource  Patient Self Care Activities:  . Self administers medications as prescribed . Attends all scheduled provider appointments . Performs ADL's independently . Calls provider office for new concerns or questions  Please see past updates related to this goal by clicking on the "Past Updates" button in the selected goal       . per son "mom does not have any prescription coverage" (pt-stated)   Not on track    [facility] prescription [facility]. [facility] but plans to do so this week.  Current Barriers:  Marland Kitchen. Knowledge deficit related to understanding Medicare benefits for prescription drug coverage  Nurse Case Manager Clinical Goal(s):  Marland Kitchen. Over the next 30 days, patient/son will explore prescription drug coverage options by utilizing [facility]  Interventions:  . [facility] [place] and encouraged him to call . Updated 8/10: engaged with CCM pharmacist, set up initial pharmacy consult . Updated 8/13: provided education surrounding different parts of Medicare, why  Medicare part D or Part C "Advantage" plans are necessary and different from supplements (the [employer] card) and what benefits she can receive from each part  Patient Self Care Activities:  . Patient/son will contact [facility]   Please see past updates related to this goal by clicking on the "Past Updates" button in the selected goal          The care management team will reach out to the patient again over the next 60 days.  The patient has been provided with contact information for the care management team and has been advised to call with any health related questions or concerns.    Drew Lips E. Suzie PortelaPayne, RN, BSN Nurse Care Coordinator [facility] Care Management [phone]

## 2019-07-13 ENCOUNTER — Ambulatory Visit: Payer: Self-pay | Admitting: Pharmacist

## 2019-07-13 ENCOUNTER — Telehealth: Payer: Self-pay

## 2019-07-13 NOTE — Chronic Care Management (AMB) (Signed)
  Chronic Care Management   Note  07/13/2019 Name: Maria Bush MRN: [id number] DOB: May 11, 1930  85 y.o. year old female referred to Chronic Care Management by health plan . Engaged with [place] and PharmD to assist with medication management and assistance- no prescription coverage and prepare patient for open enrollment, HCPOA, etc.   Was unable to reach patient's son Palmer Shorey via telephone today and have left HIPAA compliant voicemail asking patient to return my call. (unsuccessful outreach #1).  Follow up plan: A HIPPA compliant phone message was left for the patient providing contact information and requesting a return call.  The care management team will reach out to the patient again over the next 5-7 days.   Ruben Reason, PharmD Clinical Pharmacist [place] [phone]

## 2019-07-17 ENCOUNTER — Other Ambulatory Visit: Payer: Self-pay

## 2019-07-17 ENCOUNTER — Ambulatory Visit (INDEPENDENT_AMBULATORY_CARE_PROVIDER_SITE_OTHER): Payer: Medicare Other | Admitting: Family Medicine

## 2019-07-17 ENCOUNTER — Encounter: Payer: Self-pay | Admitting: Family Medicine

## 2019-07-17 VITALS — BP 117/68 | HR 51 | Temp 97.1°F | Wt 128.0 lb

## 2019-07-17 DIAGNOSIS — Z23 Encounter for immunization: Secondary | ICD-10-CM

## 2019-07-17 DIAGNOSIS — R32 Unspecified urinary incontinence: Secondary | ICD-10-CM | POA: Diagnosis not present

## 2019-07-17 DIAGNOSIS — F0390 Unspecified dementia without behavioral disturbance: Secondary | ICD-10-CM | POA: Diagnosis not present

## 2019-07-17 NOTE — Progress Notes (Signed)
Patient: Maria LodgeMargaret L Kienitz Female    DOB: 1929/12/21   85 y.o.   MRN: [id number] Visit Date: 07/17/2019  Today's Provider: Mila Merryonald Nochum Fenter, MD   Chief Complaint  Patient presents with  . Dementia   Subjective:     HPI    Follow up for Dementia  The patient was last seen for this 2 months ago. Changes made at last visit include Started Donepezil 5mg  daily.  She reports fair compliance with treatment.  Pts son reports she started the medicine a few days ago. She feels that condition is Unchanged. She is not having side effects.   ------------------------------------------------------------------------------------ As patient was leaving office, her son is with her today and also requests referral to urology for worsening urinary incontinence. She was previously follow up by Dr. Achilles Dunkope, but not seen for several years,  and they would like to establish with another local urologist.    Allergies  Allergen Reactions  . Cephalexin Shortness Of Breath  . Prednisone Nausea And Vomiting  . Sulfa Antibiotics Itching  . Penicillins Rash     Current Outpatient Medications:  .  amLODipine (NORVASC) 5 MG tablet, Take 5 mg by mouth daily. , Disp: , Rfl:  .  Calcium Carbonate-Vitamin D3 600-400 MG-UNIT TABS, Take 1 tablet by mouth daily., Disp: 60 tablet, Rfl: 6 .  chlorthalidone (HYGROTON) 25 MG tablet, TAKE 1 TABLET(25 MG) BY MOUTH DAILY, Disp: 30 tablet, Rfl: 12 .  donepezil (ARICEPT) 5 MG tablet, Take 1 tablet (5 mg total) by mouth at bedtime., Disp: 30 tablet, Rfl: 3 .  FEROSUL 325 (65 Fe) MG tablet, Take 1 tablet (325 mg total) by mouth daily., Disp: 90 tablet, Rfl: 4 .  furosemide (LASIX) 20 MG tablet, TAKE ONE (1) TABLET BY MOUTH EVERY DAY, Disp: 90 tablet, Rfl: 3 .  latanoprost (XALATAN) 0.005 % ophthalmic solution, Place 1 drop into both eyes at bedtime., Disp: , Rfl:  .  lisinopril (PRINIVIL,ZESTRIL) 20 MG tablet, Take 1 tablet (20 mg total) by mouth daily., Disp: 90  tablet, Rfl: 3 .  loratadine (CLARITIN) 10 MG tablet, Take by mouth daily as needed. , Disp: , Rfl:  .  mirtazapine (REMERON) 30 MG tablet, Take by mouth., Disp: , Rfl:  .  omeprazole (PRILOSEC) 40 MG capsule, TAKE ONE CAPSULE BY MOUTH DAILY (Patient taking differently: Take 20 mg by mouth daily. ), Disp: 30 capsule, Rfl: 11 .  potassium chloride (K-DUR) 10 MEQ tablet, Take 1 tablet (10 mEq total) by mouth daily., Disp: 90 tablet, Rfl: 3 .  traMADol (ULTRAM) 50 MG tablet, Take 1 tablet (50 mg total) by mouth every 8 (eight) hours as needed., Disp: 30 tablet, Rfl: 3 .  aspirin EC 81 MG tablet, Take 81 mg by mouth daily., Disp: , Rfl:  .  atenolol (TENORMIN) 25 MG tablet, Take 1 tablet (25 mg total) by mouth daily. (Patient not taking: Reported on 07/17/2019), Disp: 90 tablet, Rfl: 4 .  mometasone (ELOCON) 0.1 % cream, Apply 1 application topically daily. As needed for neck/posterior ear rash (Patient not taking: Reported on 06/20/2019), Disp: 45 g, Rfl: 0 .  Travoprost, BAK Free, (TRAVATAN Z) 0.004 % SOLN ophthalmic solution, Place 1 drop into both eyes at bedtime. , Disp: , Rfl:   Review of Systems  Constitutional: Negative.   Respiratory: Negative.   Gastrointestinal: Negative.   Psychiatric/Behavioral: Positive for confusion. Negative for agitation, behavioral problems, decreased concentration, dysphoric mood, hallucinations, self-injury, sleep disturbance and suicidal ideas.  The patient is not nervous/anxious and is not hyperactive.     Social History   Tobacco Use  . Smoking status: Never Smoker  . Smokeless tobacco: Never Used  Substance Use Topics  . Alcohol use: No      Objective:   BP 117/68 (BP Location: Right Arm, Patient Position: Sitting, Cuff Size: Normal)   Pulse (!) 51   Temp (!) 97.1 F (36.2 C) (Temporal)   Wt 128 lb (58.1 kg)   BMI 23.41 kg/m  Vitals:   07/17/19 1401  BP: 117/68  Pulse: (!) 51  Temp: (!) 97.1 F (36.2 C)  TempSrc: Temporal  Weight: 128 lb  (58.1 kg)  Body mass index is 23.41 kg/m.   Physical Exam  General appearance: alert, well developed, well nourished, cooperative and in no distress Head: Normocephalic, without obvious abnormality, atraumatic Respiratory: Respirations even and unlabored, normal respiratory rate Extremities: All extremities are intact.  Skin: Skin color, texture, turgor normal. No rashes seen  Psych: Appropriate mood and affect. Neurologic: Mental status: Alert, oriented to person, place     Assessment & Plan    1. Urinary incontinence, unspecified type Would like to re-establish with urology as she has not seen Dr. Jacqlyn Larsen in several years.  - Ambulatory referral to Urology  2. Need for influenza vaccination  - Flu Vaccine QUAD High Dose(Fluad)  3. Dementia without behavioral disturbance, unspecified dementia type ([place]) Has only been taking donepezil for less that a week, will return in about 2 months to assess effectiveness of medication.    The entirety of the information documented in the History of Present Illness, Review of Systems and Physical Exam were personally obtained by me. Portions of this information were initially documented by Ashley Royalty, CMA and reviewed by me for thoroughness and accuracy.      Lelon Huh, MD  [facility]

## 2019-07-17 NOTE — Patient Instructions (Signed)
.   Please review the attached list of medications and notify my office if there are any errors.   . Please bring all of your medications to every appointment so we can make sure that our medication list is the same as yours.   . It is especially important to get the annual flu vaccine this year. If you haven't had it already, please go to your pharmacy or call the office as soon as possible to schedule you flu shot.  

## 2019-07-20 ENCOUNTER — Ambulatory Visit: Payer: Medicare Other | Admitting: Pharmacist

## 2019-07-20 DIAGNOSIS — F0390 Unspecified dementia without behavioral disturbance: Secondary | ICD-10-CM

## 2019-07-20 NOTE — Chronic Care Management (AMB) (Signed)
  Chronic Care Management   Note  07/20/2019 Name: Maria Bush MRN: [id number] DOB: 02/24/1930  Follow up outreach to Alexander Mt, son of Maria Bush, today. Unfortunately Mr. Fedorchak is at work, does not have his documents/notes with him. We rescheduled pharmacy follow up for September 10  Follow up plan: Telephone follow up appointment with care management team member scheduled for: September 10  Ruben Reason, PharmD Clinical Pharmacist [place] [phone]

## 2019-07-27 ENCOUNTER — Ambulatory Visit (INDEPENDENT_AMBULATORY_CARE_PROVIDER_SITE_OTHER): Payer: Medicare Other | Admitting: Pharmacist

## 2019-07-27 DIAGNOSIS — R32 Unspecified urinary incontinence: Secondary | ICD-10-CM

## 2019-07-27 DIAGNOSIS — F0390 Unspecified dementia without behavioral disturbance: Secondary | ICD-10-CM

## 2019-07-27 NOTE — Chronic Care Management (AMB) (Signed)
  Chronic Care Management   Follow Up Note   07/27/2019 Name: Maria Bush MRN: [id number] DOB: 1930/09/30  Subjective Maria Bush is a 85 y.o. year old female who is a primary care patient of Maria Bush, Maria Peri, MD. The CCM clinical pharmacist following up today with son Maria Bush (DPR)  Regarding healthcare paperwork and [employer]. HIPAA identifiers verified.  Assessment Review of patient status, including review of consultants reports, relevant laboratory and other test results, and collaboration with appropriate care team members and the patient's provider was performed as part of comprehensive patient evaluation and provision of chronic care management services.     Outpatient Encounter Medications as of 07/27/2019  Medication Sig  . amLODipine (NORVASC) 5 MG tablet Take 5 mg by mouth daily.   Marland Kitchen aspirin EC 81 MG tablet Take 81 mg by mouth daily.  Marland Kitchen atenolol (TENORMIN) 25 MG tablet Take 1 tablet (25 mg total) by mouth daily. (Patient not taking: Reported on 07/17/2019)  . Calcium Carbonate-Vitamin D3 600-400 MG-UNIT TABS Take 1 tablet by mouth daily.  . chlorthalidone (HYGROTON) 25 MG tablet TAKE 1 TABLET(25 MG) BY MOUTH DAILY  . donepezil (ARICEPT) 5 MG tablet Take 1 tablet (5 mg total) by mouth at bedtime.  . FEROSUL 325 (65 Fe) MG tablet Take 1 tablet (325 mg total) by mouth daily.  . furosemide (LASIX) 20 MG tablet TAKE ONE (1) TABLET BY MOUTH EVERY DAY  . latanoprost (XALATAN) 0.005 % ophthalmic solution Place 1 drop into both eyes at bedtime.  Marland Kitchen lisinopril (PRINIVIL,ZESTRIL) 20 MG tablet Take 1 tablet (20 mg total) by mouth daily.  Marland Kitchen loratadine (CLARITIN) 10 MG tablet Take by mouth daily as needed.   . mirtazapine (REMERON) 30 MG tablet Take by mouth.  . mometasone (ELOCON) 0.1 % cream Apply 1 application topically daily. As needed for neck/posterior ear rash (Patient not taking: Reported on 06/20/2019)  . omeprazole (PRILOSEC) 40 MG capsule TAKE ONE CAPSULE BY MOUTH  DAILY (Patient taking differently: Take 20 mg by mouth daily. )  . potassium chloride (K-DUR) 10 MEQ tablet Take 1 tablet (10 mEq total) by mouth daily.  . traMADol (ULTRAM) 50 MG tablet Take 1 tablet (50 mg total) by mouth every 8 (eight) hours as needed.  . Travoprost, BAK Free, (TRAVATAN Z) 0.004 % SOLN ophthalmic solution Place 1 drop into both eyes at bedtime.    No facility-administered encounter medications on file as of 07/27/2019.      Plan: Mr. Maria Bush states that he still hasn't had time to go through The Maria Bush paperwork. Plans to go through Maria Bush and living will paperwork by early next week. CCM pharmacist will discontinue outreach at this time until Mr. Maria Bush is ready for pharmacy engagement in the form of medication management, assistance (patient does not have prescription benefits), or medication adherence packaging (pill packs).   Follow up The patient has been provided with contact information for the care management team and has been advised to call with any health related questions or concerns.    Also provided patient's son Maria Bush with contact information for LCSW Maria Bush to assist him in setting up long term care or home care.    Maria Bush, PharmD Clinical Pharmacist [place] [phone]

## 2019-08-04 ENCOUNTER — Other Ambulatory Visit: Payer: Self-pay | Admitting: Family Medicine

## 2019-08-21 ENCOUNTER — Other Ambulatory Visit: Payer: Self-pay

## 2019-08-21 ENCOUNTER — Encounter: Payer: Self-pay | Admitting: Urology

## 2019-08-21 ENCOUNTER — Ambulatory Visit (INDEPENDENT_AMBULATORY_CARE_PROVIDER_SITE_OTHER): Payer: Medicare Other | Admitting: Urology

## 2019-08-21 VITALS — BP 109/69 | HR 51 | Ht 62.0 in | Wt 130.0 lb

## 2019-08-21 DIAGNOSIS — N3946 Mixed incontinence: Secondary | ICD-10-CM

## 2019-08-21 MED ORDER — MIRABEGRON ER 50 MG PO TB24: 50.0000 mg | ORAL_TABLET | Freq: Every day | ORAL | 11 refills | Status: DC

## 2019-08-21 NOTE — Progress Notes (Signed)
08/21/2019 11:34 AM   Maria Bush 07/18/1930 [id number]  Referring provider: Malva LimesFisher, Bush E, MD [address] [place] [phone]  Chief Complaint  Patient presents with   Urinary Incontinence    New Patient    HPI: I was consulted to assist the patient is urinary incontinence.  Her family was most helpful in the history.  She has dementia.  She wears 3 pads a day moderately wet I think primarily has urge incontinence.  He cannot tell if she has bedwetting.  She is frequency.  There is no documented history of bladder infections.  She may have had bladder surgery many years ago.  No other neurologic issues  Modifying factors: There are no other modifying factors  Associated signs and symptoms: There are no other associated signs and symptoms Aggravating and relieving factors: There are no other aggravating or relieving factors Severity: Moderate Duration: Persistent   PMH: Past Medical History:  Diagnosis Date   Acute blood loss anemia    Arthritis 2005   Diverticulosis    GI bleed    Heart murmur 2005   Hypertension    Memory loss     Surgical History: Past Surgical History:  Procedure Laterality Date   ABDOMINAL HYSTERECTOMY  1981   BLADDER SURGERY  1992   BREAST BIOPSY  1992   CARDIAC CATHETERIZATION  2011   CATARACT EXTRACTION Bilateral 2015   CHOLECYSTECTOMY  08/09/2000   COLONOSCOPY  1982   DILATION AND CURETTAGE OF UTERUS  1979   GIVENS CAPSULE STUDY N/A 03/27/2017   Procedure: GIVENS CAPSULE STUDY;  Surgeon: Bernette RedbirdBuccini, Robert, MD;  [facility];  Service: Endoscopy;  Laterality: N/A;   RECTOCELE REPAIR  1994    Home Medications:  Allergies as of 08/21/2019      Reactions   Cephalexin Shortness Of Breath   Prednisone Nausea And Vomiting   Sulfa Antibiotics Itching   Penicillins Rash      Medication List       Accurate as of August 21, 2019 11:34 AM. If you have any questions, ask your nurse  or doctor.        amLODipine 5 MG tablet Commonly known as: NORVASC Take 5 mg by mouth daily.   aspirin EC 81 MG tablet Take 81 mg by mouth daily.   atenolol 25 MG tablet Commonly known as: TENORMIN TAKE 1 TABLET BY MOUTH DAILY   Calcium Carbonate-Vitamin D3 600-400 MG-UNIT Tabs Take 1 tablet by mouth daily.   chlorthalidone 25 MG tablet Commonly known as: HYGROTON TAKE 1 TABLET(25 MG) BY MOUTH DAILY   donepezil 5 MG tablet Commonly known as: Aricept Take 1 tablet (5 mg total) by mouth at bedtime.   FeroSul 325 (65 FE) MG tablet Generic drug: ferrous sulfate Take 1 tablet (325 mg total) by mouth daily.   furosemide 20 MG tablet Commonly known as: LASIX TAKE 1 TABLET BY MOUTH EVERY DAY   latanoprost 0.005 % ophthalmic solution Commonly known as: XALATAN Place 1 drop into both eyes at bedtime.   lisinopril 20 MG tablet Commonly known as: ZESTRIL Take 1 tablet (20 mg total) by mouth daily.   loratadine 10 MG tablet Commonly known as: CLARITIN Take by mouth daily as needed.   mirtazapine 30 MG tablet Commonly known as: REMERON Take by mouth.   mometasone 0.1 % cream Commonly known as: ELOCON Apply 1 application topically daily. As needed for neck/posterior ear rash   omeprazole 40 MG capsule Commonly  known as: PRILOSEC TAKE ONE CAPSULE BY MOUTH DAILY What changed: how much to take   potassium chloride 10 MEQ tablet Commonly known as: KLOR-CON Take 1 tablet (10 mEq total) by mouth daily.   traMADol 50 MG tablet Commonly known as: ULTRAM Take 1 tablet (50 mg total) by mouth every 8 (eight) hours as needed.   Travatan Z 0.004 % Soln ophthalmic solution Generic drug: Travoprost (BAK Free) Place 1 drop into both eyes at bedtime.       Allergies:  Allergies  Allergen Reactions   Cephalexin Shortness Of Breath   Prednisone Nausea And Vomiting   Sulfa Antibiotics Itching   Penicillins Rash    Family History: Family History  Problem Relation  Age of Onset   Breast cancer Sister    Early death Mother    Stroke Father    Heart disease Brother    Heart attack Brother    Heart disease Brother    Heart attack Brother     Social History:  reports that she has never smoked. She has never used smokeless tobacco. She reports that she does not drink alcohol or use drugs.  ROS: UROLOGY Frequent Urination?: No Hard to postpone urination?: Yes Burning/pain with urination?: No Get up at night to urinate?: No Leakage of urine?: Yes Urine stream starts and stops?: No Trouble starting stream?: No Do you have to strain to urinate?: No Blood in urine?: No Urinary tract infection?: No Sexually transmitted disease?: No Injury to kidneys or bladder?: No Painful intercourse?: No Weak stream?: No Currently pregnant?: No Vaginal bleeding?: No Last menstrual period?: n  Gastrointestinal Nausea?: No Vomiting?: No Indigestion/heartburn?: No Diarrhea?: No Constipation?: No  Constitutional Fever: No Night sweats?: No Weight loss?: No Fatigue?: No  Skin Skin rash/lesions?: No Itching?: No  Eyes Blurred vision?: No Double vision?: No  Ears/Nose/Throat Sore throat?: No Sinus problems?: No  Hematologic/Lymphatic Swollen glands?: No Easy bruising?: No  Cardiovascular Leg swelling?: No Chest pain?: No  Respiratory Cough?: No Shortness of breath?: No  Endocrine Excessive thirst?: No  Musculoskeletal Back pain?: No Joint pain?: Yes  Neurological Headaches?: No Dizziness?: No  Psychologic Depression?: No Anxiety?: No  Physical Exam: BP 109/69    Pulse (!) 51    Ht 5\' 2"  (1.575 m)    Wt 59 kg    BMI 23.78 kg/m   Constitutional:  Alert and oriented, No acute distress. HEENT: [place] AT, moist mucus membranes.  Trachea midline, no masses. Cardiovascular: No clubbing, cyanosis, or edema. Respiratory: Normal respiratory effort, no increased work of breathing. GI: Abdomen is soft, nontender, nondistended,  no abdominal masses GU: No CVA tenderness.  No bladder tenderness Skin: No rashes, bruises or suspicious lesions. Lymph: No cervical or inguinal adenopathy. Neurologic: Grossly intact, no focal deficits, moving all 4 extremities. Psychiatric: Normal mood and affect.  Laboratory Data: Lab Results  Component Value Date   WBC 4.6 11/11/2018   HGB 11.4 11/11/2018   HCT 34.3 11/11/2018   MCV 99 (H) 11/11/2018   PLT 280 11/11/2018    Lab Results  Component Value Date   CREATININE 1.06 (H) 05/10/2019    No results found for: PSA  No results found for: TESTOSTERONE  No results found for: HGBA1C  Urinalysis    Component Value Date/Time   COLORURINE YELLOW (A) 08/06/2018 1704   APPEARANCEUR CLEAR (A) 08/06/2018 1704   APPEARANCEUR Clear 08/14/2014 2204   LABSPEC 1.014 08/06/2018 1704   LABSPEC 1.006 08/14/2014 2204   PHURINE 6.0 08/06/2018  1704   GLUCOSEU NEGATIVE 08/06/2018 1704   GLUCOSEU Negative 08/14/2014 2204   HGBUR NEGATIVE 08/06/2018 1704   BILIRUBINUR NEGATIVE 08/06/2018 1704   BILIRUBINUR negative 09/10/2017 1636   BILIRUBINUR Negative 08/14/2014 2204   KETONESUR NEGATIVE 08/06/2018 1704   PROTEINUR NEGATIVE 08/06/2018 1704   UROBILINOGEN 0.2 09/10/2017 1636   NITRITE NEGATIVE 08/06/2018 1704   LEUKOCYTESUR TRACE (A) 08/06/2018 1704   LEUKOCYTESUR Negative 08/14/2014 2204    Pertinent Imaging:   Assessment & Plan: We were not able to get a sample today.  I will try her on Myrbetriq 50 mg samples and prescription and perform a pelvic examination cystoscopy first safety reasons in about 6 weeks.  I think this will help give assurance to her family as well    There are no diagnoses linked to this encounter.  No follow-ups on file.  Martina Sinner, MD  [facility] [address] [place], [place] [address] [phone]

## 2019-09-19 ENCOUNTER — Ambulatory Visit (INDEPENDENT_AMBULATORY_CARE_PROVIDER_SITE_OTHER): Payer: Medicare Other | Admitting: Family Medicine

## 2019-09-19 ENCOUNTER — Other Ambulatory Visit: Payer: Self-pay

## 2019-09-19 ENCOUNTER — Encounter: Payer: Self-pay | Admitting: Family Medicine

## 2019-09-19 VITALS — BP 122/64 | HR 53 | Temp 97.1°F | Resp 16 | Wt 123.0 lb

## 2019-09-19 DIAGNOSIS — E2839 Other primary ovarian failure: Secondary | ICD-10-CM | POA: Diagnosis not present

## 2019-09-19 DIAGNOSIS — Z7409 Other reduced mobility: Secondary | ICD-10-CM

## 2019-09-19 DIAGNOSIS — F0390 Unspecified dementia without behavioral disturbance: Secondary | ICD-10-CM | POA: Diagnosis not present

## 2019-09-19 DIAGNOSIS — R2681 Unsteadiness on feet: Secondary | ICD-10-CM

## 2019-09-19 NOTE — Patient Instructions (Signed)
.   Please review the attached list of medications and notify my office if there are any errors.   . Please bring all of your medications to every appointment so we can make sure that our medication list is the same as yours.   . It is especially important to get the annual flu vaccine this year. If you haven't had it already, please go to your pharmacy or call the office as soon as possible to schedule you flu shot.  

## 2019-09-19 NOTE — Progress Notes (Addendum)
Patient: Maria Bush Female    DOB: [birth year]   85 y.o.   MRN: [id number] Visit Date: 09/19/2019  Today's Provider: Lelon Huh, MD   Chief Complaint  Patient presents with   Dementia   Subjective:     HPI  Follow up for Dementia:  The patient was last seen for this 2 months ago. Changes made at last visit include none; patient was to continue Donepezil and return in 2 months to assess effectiveness of medication.  She reports poor compliance with treatment. Patient's son states he stopping giving her the Donepezil due to it causing Hallucinations.  She feels that condition is Unchanged.  ------------------------------------------------------------------------------------  Allergies  Allergen Reactions   Cephalexin Shortness Of Breath   Prednisone Nausea And Vomiting   Sulfa Antibiotics Itching   Penicillins Rash     Current Outpatient Medications:    amLODipine (NORVASC) 5 MG tablet, Take 5 mg by mouth daily. , Disp: , Rfl:    atenolol (TENORMIN) 25 MG tablet, TAKE 1 TABLET BY MOUTH DAILY, Disp: 270 tablet, Rfl: 3   Calcium Carbonate-Vitamin D3 600-400 MG-UNIT TABS, Take 1 tablet by mouth daily., Disp: 60 tablet, Rfl: 6   chlorthalidone (HYGROTON) 25 MG tablet, TAKE 1 TABLET(25 MG) BY MOUTH DAILY, Disp: 30 tablet, Rfl: 12   furosemide (LASIX) 20 MG tablet, TAKE 1 TABLET BY MOUTH EVERY DAY, Disp: 90 tablet, Rfl: 3   latanoprost (XALATAN) 0.005 % ophthalmic solution, Place 1 drop into both eyes at bedtime., Disp: , Rfl:    lisinopril (PRINIVIL,ZESTRIL) 20 MG tablet, Take 1 tablet (20 mg total) by mouth daily., Disp: 90 tablet, Rfl: 3   omeprazole (PRILOSEC) 40 MG capsule, TAKE ONE CAPSULE BY MOUTH DAILY (Patient taking differently: Take 20 mg by mouth daily. ), Disp: 30 capsule, Rfl: 11   potassium chloride (K-DUR) 10 MEQ tablet, Take 1 tablet (10 mEq total) by mouth daily., Disp: 90 tablet, Rfl: 3   Travoprost, BAK Free, (TRAVATAN Z) 0.004 %  SOLN ophthalmic solution, Place 1 drop into both eyes at bedtime. , Disp: , Rfl:    aspirin EC 81 MG tablet, Take 81 mg by mouth daily., Disp: , Rfl:    donepezil (ARICEPT) 5 MG tablet, Take 1 tablet (5 mg total) by mouth at bedtime. (Patient not taking: Reported on 09/19/2019), Disp: 30 tablet, Rfl: 3   FEROSUL 325 (65 Fe) MG tablet, Take 1 tablet (325 mg total) by mouth daily. (Patient not taking: Reported on 09/19/2019), Disp: 90 tablet, Rfl: 4   loratadine (CLARITIN) 10 MG tablet, Take by mouth daily as needed. , Disp: , Rfl:    mirabegron ER (MYRBETRIQ) 50 MG TB24 tablet, Take 1 tablet (50 mg total) by mouth daily. (Patient not taking: Reported on 09/19/2019), Disp: 30 tablet, Rfl: 11   mirtazapine (REMERON) 30 MG tablet, Take by mouth., Disp: , Rfl:    mometasone (ELOCON) 0.1 % cream, Apply 1 application topically daily. As needed for neck/posterior ear rash (Patient not taking: Reported on 06/20/2019), Disp: 45 g, Rfl: 0   traMADol (ULTRAM) 50 MG tablet, Take 1 tablet (50 mg total) by mouth every 8 (eight) hours as needed. (Patient not taking: Reported on 09/19/2019), Disp: 30 tablet, Rfl: 3  Review of Systems  Constitutional: Negative for appetite change, chills, fatigue and fever.  Respiratory: Negative for chest tightness and shortness of breath.   Cardiovascular: Negative for chest pain and palpitations.  Gastrointestinal: Negative for abdominal pain, nausea and vomiting.  Neurological: Negative for dizziness and weakness.  Psychiatric/Behavioral: Positive for confusion, decreased concentration and hallucinations.    Social History   Tobacco Use   Smoking status: Never Smoker   Smokeless tobacco: Never Used  Substance Use Topics   Alcohol use: No      Objective:   BP 122/64 (BP Location: Left Arm, Patient Position: Sitting, Cuff Size: Normal)    Pulse (!) 53    Temp (!) 97.1 F (36.2 C) (Temporal)    Resp 16    Wt 123 lb (55.8 kg)    SpO2 98% Comment: room air   BMI  22.50 kg/m  Vitals:   09/19/19 1342  BP: 122/64  Pulse: (!) 53  Resp: 16  Temp: (!) 97.1 F (36.2 C)  TempSrc: Temporal  SpO2: 98%  Weight: 123 lb (55.8 kg)  Body mass index is 22.5 kg/m.   Physical Exam  General appearance: Well developed, well nourished female, cooperative and in no acute distress Head: Normocephalic, without obvious abnormality, atraumatic Respiratory: Respirations even and unlabored, normal respiratory rate Extremities: All extremities are intact. Requires assistance standing and ambulating due to generalized weakness and balance issues. Skin: Skin color, texture, turgor normal. No rashes seen  Psych: Appropriate mood and affect. Neurologic: Mental status: Alert, oriented to person only      Assessment & Plan    1. Dementia without behavioral disturbance, unspecified dementia type (HCC) No significant improvement with donepezil. Discussed medication options including adding another medication such as Namenda or change to Exelon. Patient's son Ethelene Browns prefers to hold off on any other medication at this time, but will consider between now and next visit.   2. Limited mobility Is requiring more assistance to get up and ambulated and would benefit from use of walker which allows her to sit to rest such as a Rollator. . Discussed working with Physical Therapist. He is going to go to medical supply store and given prescription   3. Unsteady gait   4. Estrogen deficiency  - DG Bone Density; Future  The entirety of the information documented in the History of Present Illness, Review of Systems and Physical Exam were personally obtained by me. Portions of this information were initially documented by Awilda Bill, CMA and reviewed by me for thoroughness and accuracy.      Mila Merry, MD  [facility]

## 2019-09-24 DIAGNOSIS — F0390 Unspecified dementia without behavioral disturbance: Secondary | ICD-10-CM | POA: Insufficient documentation

## 2019-10-02 ENCOUNTER — Other Ambulatory Visit: Payer: Self-pay

## 2019-10-02 ENCOUNTER — Ambulatory Visit (INDEPENDENT_AMBULATORY_CARE_PROVIDER_SITE_OTHER): Payer: Medicare Other | Admitting: Urology

## 2019-10-02 ENCOUNTER — Encounter: Payer: Self-pay | Admitting: Urology

## 2019-10-02 VITALS — BP 114/61 | HR 51 | Ht 62.0 in | Wt 123.0 lb

## 2019-10-02 DIAGNOSIS — N3020 Other chronic cystitis without hematuria: Secondary | ICD-10-CM | POA: Diagnosis not present

## 2019-10-02 LAB — URINALYSIS, COMPLETE
Bilirubin, UA: NEGATIVE
Glucose, UA: NEGATIVE
Ketones, UA: NEGATIVE
Nitrite, UA: POSITIVE — AB
Protein,UA: NEGATIVE
RBC, UA: NEGATIVE
Specific Gravity, UA: 1.015 (ref 1.005–1.030)
Urobilinogen, Ur: 0.2 mg/dL (ref 0.2–1.0)
pH, UA: 6 (ref 5.0–7.5)

## 2019-10-02 LAB — MICROSCOPIC EXAMINATION: RBC, Urine: NONE SEEN /hpf (ref 0–2)

## 2019-10-02 MED ORDER — CIPROFLOXACIN HCL 250 MG PO TABS: 250.0000 mg | ORAL_TABLET | Freq: Two times a day (BID) | ORAL | 0 refills | Status: DC

## 2019-10-02 MED ORDER — NITROFURANTOIN MACROCRYSTAL 100 MG PO CAPS: 100.0000 mg | ORAL_CAPSULE | Freq: Four times a day (QID) | ORAL | 11 refills | Status: DC

## 2019-10-02 NOTE — Addendum Note (Signed)
Addended by: Verlene Mayer A on: 10/02/2019 03:06 PM   Modules accepted: Orders

## 2019-10-02 NOTE — Progress Notes (Signed)
10/02/2019 2:43 PM   Maria Bush February 02, 1930 [id number]  Referring provider: Malva Limes, MD [address] [place],  [place] [address]  Chief Complaint  Patient presents with  . Cysto    HPI: I was consulted to assist the patient is urinary incontinence.  Her family was most helpful in the history.  She has dementia.  She wears 3 pads a day moderately wet I think primarily has urge incontinence.  I cannot tell if she has bedwetting.  She is frequency.  There is no documented history of bladder infections.  She may have had bladder surgery many years ago.  We were not able to get a sample today.  I will try her on Myrbetriq 50 mg samples and prescription and perform a pelvic examination cystoscopy first safety reasons in about 6 weeks.  I think this will help give assurance to her family as well  Today Frequency somewhat improved but she was not taking it every day.  Her son was helpful.  Clinically not infected.  Cystoscopy: Patient underwent flexible cystoscopy utilizing sterile technique.  She had minimal diffuse bladder erythema but a lot of cloudy urine with white flecks.  Urine aspirated for culture.  She tolerated well.  No significant prolapse and mild meatal stenosis      PMH: Past Medical History:  Diagnosis Date  . Acute blood loss anemia   . Arthritis 2005  . Diverticulosis   . GI bleed   . Heart murmur 2005  . Hypertension   . Memory loss     Surgical History: Past Surgical History:  Procedure Laterality Date  . ABDOMINAL HYSTERECTOMY  1981  . BLADDER SURGERY  1992  . BREAST BIOPSY  1992  . CARDIAC CATHETERIZATION  2011  . CATARACT EXTRACTION Bilateral 2015  . CHOLECYSTECTOMY  08/09/2000  . COLONOSCOPY  1982  . DILATION AND CURETTAGE OF UTERUS  1979  . GIVENS CAPSULE STUDY N/A 03/27/2017   Procedure: GIVENS CAPSULE STUDY;  Surgeon: Bernette Redbird, MD;  Location: [facility] ENDOSCOPY;  Service: Endoscopy;  Laterality: N/A;  . RECTOCELE  REPAIR  1994    Home Medications:  Allergies as of 10/02/2019      Reactions   Cephalexin Shortness Of Breath   Aricept [donepezil]    Hallucinations   Prednisone Nausea And Vomiting   Sulfa Antibiotics Itching   Penicillins Rash      Medication List       Accurate as of October 02, 2019  2:43 PM. If you have any questions, ask your nurse or doctor.        amLODipine 5 MG tablet Commonly known as: NORVASC Take 5 mg by mouth daily.   atenolol 25 MG tablet Commonly known as: TENORMIN TAKE 1 TABLET BY MOUTH DAILY   Calcium Carbonate-Vitamin D3 600-400 MG-UNIT Tabs Take 1 tablet by mouth daily.   chlorthalidone 25 MG tablet Commonly known as: HYGROTON TAKE 1 TABLET(25 MG) BY MOUTH DAILY   FeroSul 325 (65 FE) MG tablet Generic drug: ferrous sulfate Take 1 tablet (325 mg total) by mouth daily.   furosemide 20 MG tablet Commonly known as: LASIX TAKE 1 TABLET BY MOUTH EVERY DAY   latanoprost 0.005 % ophthalmic solution Commonly known as: XALATAN Place 1 drop into both eyes at bedtime.   lisinopril 20 MG tablet Commonly known as: ZESTRIL Take 1 tablet (20 mg total) by mouth daily.   loratadine 10 MG tablet Commonly known as: CLARITIN Take by mouth daily as  needed.   mirabegron ER 50 MG Tb24 tablet Commonly known as: MYRBETRIQ Take 1 tablet (50 mg total) by mouth daily.   mirtazapine 30 MG tablet Commonly known as: REMERON Take by mouth.   mometasone 0.1 % cream Commonly known as: ELOCON Apply 1 application topically daily. As needed for neck/posterior ear rash   omeprazole 40 MG capsule Commonly known as: PRILOSEC TAKE ONE CAPSULE BY MOUTH DAILY What changed: how much to take   potassium chloride 10 MEQ tablet Commonly known as: KLOR-CON Take 1 tablet (10 mEq total) by mouth daily.   traMADol 50 MG tablet Commonly known as: ULTRAM Take 1 tablet (50 mg total) by mouth every 8 (eight) hours as needed.   Travatan Z 0.004 % Soln ophthalmic solution  Generic drug: Travoprost (BAK Free) Place 1 drop into both eyes at bedtime.       Allergies:  Allergies  Allergen Reactions  . Cephalexin Shortness Of Breath  . Aricept [Donepezil]     Hallucinations   . Prednisone Nausea And Vomiting  . Sulfa Antibiotics Itching  . Penicillins Rash    Family History: Family History  Problem Relation Age of Onset  . Breast cancer Sister   . Early death Mother   . Stroke Father   . Heart disease Brother   . Heart attack Brother   . Heart disease Brother   . Heart attack Brother     Social History:  reports that she has never smoked. She has never used smokeless tobacco. She reports that she does not drink alcohol or use drugs.  ROS:                                        Physical Exam: There were no vitals taken for this visit.  Constitutional:  Alert and oriented, No acute distress.  Laboratory Data: Lab Results  Component Value Date   WBC 4.6 11/11/2018   HGB 11.4 11/11/2018   HCT 34.3 11/11/2018   MCV 99 (H) 11/11/2018   PLT 280 11/11/2018    Lab Results  Component Value Date   CREATININE 1.06 (H) 05/10/2019    No results found for: PSA  No results found for: TESTOSTERONE  No results found for: HGBA1C  Urinalysis    Component Value Date/Time   COLORURINE YELLOW (A) 08/06/2018 1704   APPEARANCEUR CLEAR (A) 08/06/2018 1704   APPEARANCEUR Clear 08/14/2014 2204   LABSPEC 1.014 08/06/2018 1704   LABSPEC 1.006 08/14/2014 2204   PHURINE 6.0 08/06/2018 1704   GLUCOSEU NEGATIVE 08/06/2018 1704   GLUCOSEU Negative 08/14/2014 2204   HGBUR NEGATIVE 08/06/2018 1704   BILIRUBINUR NEGATIVE 08/06/2018 1704   BILIRUBINUR negative 09/10/2017 1636   BILIRUBINUR Negative 08/14/2014 2204   KETONESUR NEGATIVE 08/06/2018 1704   PROTEINUR NEGATIVE 08/06/2018 1704   UROBILINOGEN 0.2 09/10/2017 1636   NITRITE NEGATIVE 08/06/2018 1704   LEUKOCYTESUR TRACE (A) 08/06/2018 1704   LEUKOCYTESUR Negative  08/14/2014 2204    Pertinent Imaging:   Assessment & Plan: Patient allergic to sulfa and Keflex.  Gets itching with sulfa.  I called in ciprofloxacin 250 mg twice a day for 1 week.  I sent urine for culture.  I called in daily Macrodantin.  Reassess in 8 weeks.  Consider restarting Myrbetriq depending on how she does  There are no diagnoses linked to this encounter.  No follow-ups on file.  Reece Packer, MD  [facility] [address] [place],  [address] [phone]

## 2019-10-02 NOTE — Addendum Note (Signed)
Addended by: Verlene Mayer A on: 10/02/2019 03:02 PM   Modules accepted: Orders

## 2019-10-06 LAB — CULTURE, URINE COMPREHENSIVE

## 2019-10-31 ENCOUNTER — Other Ambulatory Visit: Payer: Medicare Other

## 2019-11-02 ENCOUNTER — Other Ambulatory Visit: Payer: Self-pay | Admitting: Family Medicine

## 2019-11-02 DIAGNOSIS — I10 Essential (primary) hypertension: Secondary | ICD-10-CM

## 2019-11-08 ENCOUNTER — Other Ambulatory Visit: Payer: Medicare Other

## 2019-11-15 ENCOUNTER — Other Ambulatory Visit: Payer: Self-pay | Admitting: Family Medicine

## 2019-11-15 ENCOUNTER — Ambulatory Visit: Payer: Self-pay | Admitting: *Deleted

## 2019-11-15 NOTE — Telephone Encounter (Signed)
Patient's son calling requesting to speak to RN regarding patient's recent fatigue. He states patient also having decreased appetite.       Returned call to patient's son regarding his mom's decrease in energy and appetite. He is wanting to know if she might need to have blood work. He stated she saw a urologist and had a urine sample taking and was put on a cipro to start for one week and then on macrodantin for daily use. She is not c/o any pain and no fever.  He feels like she is alert within her normal being. He is requesting a virtual appointment. He would also like to discuss home health needs.  Routing to [facility] for review.  Reason for Disposition . Nursing judgment  Protocols used: NO GUIDELINE OR REFERENCE AVAILABLE-A-AH

## 2019-11-16 ENCOUNTER — Emergency Department: Payer: Medicare Other

## 2019-11-16 ENCOUNTER — Other Ambulatory Visit: Payer: Self-pay

## 2019-11-16 ENCOUNTER — Inpatient Hospital Stay
Admission: EM | Admit: 2019-11-16 | Discharge: 2019-11-21 | DRG: 683 | Disposition: A | Discharge status: Skilled Nursing Facility | Payer: Medicare Other | Attending: Internal Medicine | Admitting: Internal Medicine

## 2019-11-16 DIAGNOSIS — R64 Cachexia: Secondary | ICD-10-CM | POA: Diagnosis present

## 2019-11-16 DIAGNOSIS — E860 Dehydration: Secondary | ICD-10-CM | POA: Diagnosis present

## 2019-11-16 DIAGNOSIS — Z20822 Contact with and (suspected) exposure to covid-19: Secondary | ICD-10-CM | POA: Diagnosis present

## 2019-11-16 DIAGNOSIS — N179 Acute kidney failure, unspecified: Principal | ICD-10-CM | POA: Diagnosis present

## 2019-11-16 DIAGNOSIS — Z880 Allergy status to penicillin: Secondary | ICD-10-CM

## 2019-11-16 DIAGNOSIS — Z7982 Long term (current) use of aspirin: Secondary | ICD-10-CM

## 2019-11-16 DIAGNOSIS — Z9841 Cataract extraction status, right eye: Secondary | ICD-10-CM

## 2019-11-16 DIAGNOSIS — Z803 Family history of malignant neoplasm of breast: Secondary | ICD-10-CM

## 2019-11-16 DIAGNOSIS — R059 Cough, unspecified: Secondary | ICD-10-CM

## 2019-11-16 DIAGNOSIS — Z6820 Body mass index (BMI) 20.0-20.9, adult: Secondary | ICD-10-CM

## 2019-11-16 DIAGNOSIS — R05 Cough: Secondary | ICD-10-CM | POA: Diagnosis not present

## 2019-11-16 DIAGNOSIS — I2510 Atherosclerotic heart disease of native coronary artery without angina pectoris: Secondary | ICD-10-CM | POA: Diagnosis present

## 2019-11-16 DIAGNOSIS — R5381 Other malaise: Secondary | ICD-10-CM | POA: Diagnosis not present

## 2019-11-16 DIAGNOSIS — Z9049 Acquired absence of other specified parts of digestive tract: Secondary | ICD-10-CM

## 2019-11-16 DIAGNOSIS — Z8249 Family history of ischemic heart disease and other diseases of the circulatory system: Secondary | ICD-10-CM

## 2019-11-16 DIAGNOSIS — Z79891 Long term (current) use of opiate analgesic: Secondary | ICD-10-CM

## 2019-11-16 DIAGNOSIS — Z823 Family history of stroke: Secondary | ICD-10-CM

## 2019-11-16 DIAGNOSIS — R410 Disorientation, unspecified: Secondary | ICD-10-CM | POA: Diagnosis not present

## 2019-11-16 DIAGNOSIS — R7989 Other specified abnormal findings of blood chemistry: Secondary | ICD-10-CM | POA: Diagnosis not present

## 2019-11-16 DIAGNOSIS — R531 Weakness: Secondary | ICD-10-CM

## 2019-11-16 DIAGNOSIS — Z79899 Other long term (current) drug therapy: Secondary | ICD-10-CM

## 2019-11-16 DIAGNOSIS — F0390 Unspecified dementia without behavioral disturbance: Secondary | ICD-10-CM | POA: Diagnosis present

## 2019-11-16 DIAGNOSIS — I10 Essential (primary) hypertension: Secondary | ICD-10-CM | POA: Diagnosis present

## 2019-11-16 DIAGNOSIS — R778 Other specified abnormalities of plasma proteins: Secondary | ICD-10-CM | POA: Diagnosis present

## 2019-11-16 DIAGNOSIS — Z90710 Acquired absence of both cervix and uterus: Secondary | ICD-10-CM

## 2019-11-16 DIAGNOSIS — Z882 Allergy status to sulfonamides status: Secondary | ICD-10-CM

## 2019-11-16 DIAGNOSIS — Z20828 Contact with and (suspected) exposure to other viral communicable diseases: Secondary | ICD-10-CM | POA: Diagnosis not present

## 2019-11-16 DIAGNOSIS — I082 Rheumatic disorders of both aortic and tricuspid valves: Secondary | ICD-10-CM | POA: Diagnosis present

## 2019-11-16 DIAGNOSIS — Z9842 Cataract extraction status, left eye: Secondary | ICD-10-CM

## 2019-11-16 DIAGNOSIS — R350 Frequency of micturition: Secondary | ICD-10-CM | POA: Diagnosis present

## 2019-11-16 DIAGNOSIS — Z881 Allergy status to other antibiotic agents status: Secondary | ICD-10-CM

## 2019-11-16 DIAGNOSIS — Z888 Allergy status to other drugs, medicaments and biological substances status: Secondary | ICD-10-CM

## 2019-11-16 DIAGNOSIS — K219 Gastro-esophageal reflux disease without esophagitis: Secondary | ICD-10-CM | POA: Diagnosis present

## 2019-11-16 DIAGNOSIS — R0902 Hypoxemia: Secondary | ICD-10-CM | POA: Diagnosis not present

## 2019-11-16 DIAGNOSIS — R627 Adult failure to thrive: Secondary | ICD-10-CM | POA: Diagnosis present

## 2019-11-16 LAB — URINALYSIS, COMPLETE (UACMP) WITH MICROSCOPIC
Bacteria, UA: NONE SEEN
Bilirubin Urine: NEGATIVE
Glucose, UA: NEGATIVE mg/dL
Hgb urine dipstick: NEGATIVE
Ketones, ur: 5 mg/dL — AB
Leukocytes,Ua: NEGATIVE
Nitrite: NEGATIVE
Protein, ur: NEGATIVE mg/dL
Specific Gravity, Urine: 1.015 (ref 1.005–1.030)
pH: 6 (ref 5.0–8.0)

## 2019-11-16 LAB — BASIC METABOLIC PANEL
Anion gap: 13 (ref 5–15)
BUN: 40 mg/dL — ABNORMAL HIGH (ref 8–23)
CO2: 25 mmol/L (ref 22–32)
Calcium: 9.1 mg/dL (ref 8.9–10.3)
Chloride: 101 mmol/L (ref 98–111)
Creatinine, Ser: 1.07 mg/dL — ABNORMAL HIGH (ref 0.44–1.00)
GFR calc Af Amer: 54 mL/min — ABNORMAL LOW (ref >60)
GFR calc non Af Amer: 47 mL/min — ABNORMAL LOW (ref >60)
Glucose, Bld: 140 mg/dL — ABNORMAL HIGH (ref 70–99)
Potassium: 4 mmol/L (ref 3.5–5.1)
Sodium: 139 mmol/L (ref 135–145)

## 2019-11-16 LAB — CBC
HCT: 38.7 % (ref 36.0–46.0)
Hemoglobin: 12.8 g/dL (ref 12.0–15.0)
MCH: 33.3 pg (ref 26.0–34.0)
MCHC: 33.1 g/dL (ref 30.0–36.0)
MCV: 100.8 fL — ABNORMAL HIGH (ref 80.0–100.0)
Platelets: 298 10*3/uL (ref 150–400)
RBC: 3.84 MIL/uL — ABNORMAL LOW (ref 3.87–5.11)
RDW: 11.4 % — ABNORMAL LOW (ref 11.5–15.5)
WBC: 14.7 10*3/uL — ABNORMAL HIGH (ref 4.0–10.5)
nRBC: 0 % (ref 0.0–0.2)

## 2019-11-16 LAB — TROPONIN I (HIGH SENSITIVITY)
Troponin I (High Sensitivity): 49 ng/L — ABNORMAL HIGH (ref <18)
Troponin I (High Sensitivity): 87 ng/L — ABNORMAL HIGH (ref <18)

## 2019-11-16 LAB — POC SARS CORONAVIRUS 2 AG: SARS Coronavirus 2 Ag: NEGATIVE

## 2019-11-16 MED ORDER — SODIUM CHLORIDE 0.9 % IV BOLUS
500.0000 mL | Freq: Once | INTRAVENOUS | Status: AC
  Administered 2019-11-16: 500 mL via INTRAVENOUS

## 2019-11-16 NOTE — Telephone Encounter (Signed)
FYI

## 2019-11-16 NOTE — ED Notes (Signed)
Pt sitting in lobby with no distress noted, family with pt due to hx dementia & confusion; reviewed pt's chart and protocol added

## 2019-11-16 NOTE — ED Notes (Signed)
Recollect trop sent to lab

## 2019-11-16 NOTE — ED Triage Notes (Signed)
PT to ED via Ems for increased weakness and decreased appetite, and decreased acitvity. PT disoriented x4, dementia at baseline. PTs son states that she might be peeing more frequently than normal and urine has a strong smell.

## 2019-11-16 NOTE — ED Triage Notes (Signed)
FIRST NURSE NOTE - pt has dementia. Has not been eating and drinking. Generalized weakness.  NAD. VSS with EMS, CBG normal with EMS

## 2019-11-16 NOTE — ED Provider Notes (Signed)
[facility] Emergency Department Provider Note  ____________________________________________  Time seen: Approximately 11:08 PM  I have reviewed the triage vital signs and the nursing notes.   HISTORY  Chief Complaint Weakness    Level 5 Caveat: Portions of the History and Physical including HPI and review of systems are unable to be completely obtained due to patient dementia  History obtained from son at bedside.  HPI Maria Bush is a 85 y.o. female with a history of GI bleed hypertension dementia  who is brought to the ED due to decreased oral intake and generalized weakness.  No falls or trauma.  No focal pain complaints.  Son reports that patient's urinary frequency has increased and seems to have a strong smell.     Past Medical History:  Diagnosis Date  . Acute blood loss anemia   . Arthritis 2005  . Diverticulosis   . GI bleed   . Heart murmur 2005  . Hypertension   . Memory loss      Patient Active Problem List   Diagnosis Date Noted  . Dementia without behavioral disturbance (HCC) 09/24/2019  . GIB (gastrointestinal bleeding) 03/30/2017  . Gastroesophageal reflux disease with esophagitis   . Acute post-hemorrhagic anemia 03/26/2017  . Bradycardia 03/26/2017  . Acute blood loss anemia 03/26/2017  . Diverticulosis   . GI bleed 03/25/2017  . Left shoulder pain 12/26/2015  . Edema 04/25/2015  . Shortness of breath 04/25/2015  . Unsteady gait 04/25/2015  . Allergic rhinitis 04/24/2015  . Arthropathia 04/24/2015  . B12 deficiency 04/24/2015  . Atherosclerosis of coronary artery 04/24/2015  . DDD (degenerative disc disease), lumbar 04/24/2015  . Clinical depression 04/24/2015  . DD (diverticular disease) 04/24/2015  . Elevated CK 04/24/2015  . Atrial premature depolarization 04/24/2015  . Hypercholesteremia 04/24/2015  . BP (high blood pressure) 04/24/2015  . Anemia, iron deficiency 04/24/2015  . 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 Lower esophageal ring 07/12/2014  . 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 Urge incontinence of urine 09/01/2013  . Incomplete emptying of bladder 09/01/2013  . Other symptoms involving urinary system(788.99) 09/01/2013  . Family history of malignant neoplasm of breast 02/10/2013  . Diffuse cystic mastopathy 02/10/2013  . Arthritis      Past Surgical History:  Procedure Laterality Date  . ABDOMINAL HYSTERECTOMY  1981  . BLADDER SURGERY  1992  . BREAST BIOPSY  1992  . CARDIAC CATHETERIZATION  2011  . CATARACT EXTRACTION Bilateral 2015  . CHOLECYSTECTOMY  08/09/2000  . COLONOSCOPY  1982  . DILATION AND CURETTAGE OF UTERUS  1979  . GIVENS CAPSULE STUDY N/A 03/27/2017   Procedure: GIVENS CAPSULE STUDY;  Surgeon: Bernette Redbird, MD;  Location: [facility] ENDOSCOPY;  Service: Endoscopy;  Laterality: N/A;  . RECTOCELE REPAIR  1994     Prior to Admission medications   Medication Sig Start Date End Date Taking? Authorizing Provider  amLODipine (NORVASC) 5 MG tablet Take 5 mg by mouth daily.  09/01/12   [provider]  atenolol (TENORMIN) 25 MG tablet TAKE 1 TABLET BY MOUTH DAILY 08/04/19   Malva Limes, MD  Calcium Carbonate-Vitamin D3 600-400 MG-UNIT TABS Take 1 tablet by mouth daily. 10/31/18   Malva Limes, MD  chlorthalidone (HYGROTON) 25 MG tablet TAKE 1 TABLET(25 MG) BY MOUTH DAILY 02/05/19   Malva Limes, MD  ciprofloxacin (CIPRO) 250 MG tablet Take 1 tablet (250 mg total) by mouth 2 (two) times daily. 10/02/19  Bjorn Loser, MD  FEROSUL 325 (65 Fe) MG tablet TAKE 1 TABLET(325 MG) BY MOUTH DAILY 11/16/19   Birdie Sons, MD  furosemide (LASIX) 20 MG tablet TAKE 1 TABLET BY MOUTH EVERY DAY 08/04/19   Birdie Sons, MD  latanoprost (XALATAN) 0.005 % ophthalmic solution Place 1 drop into both eyes at bedtime.    [provider]  lisinopril (PRINIVIL,ZESTRIL) 20 MG tablet Take 1 tablet (20 mg total)  by mouth daily. 01/23/19   Birdie Sons, MD  loratadine (CLARITIN) 10 MG tablet Take by mouth daily as needed.     [provider]  mirabegron ER (MYRBETRIQ) 50 MG TB24 tablet Take 1 tablet (50 mg total) by mouth daily. Patient not taking: Reported on 09/19/2019 08/21/19   Bjorn Loser, MD  mirtazapine (REMERON) 30 MG tablet Take by mouth. 06/19/14   [provider]  mometasone (ELOCON) 0.1 % cream Apply 1 application topically daily. As needed for neck/posterior ear rash Patient not taking: Reported on 06/20/2019 05/13/18   Carmon Ginsberg, PA  nitrofurantoin (MACRODANTIN) 100 MG capsule Take 1 capsule (100 mg total) by mouth 4 (four) times daily. 10/02/19   Bjorn Loser, MD  omeprazole (PRILOSEC) 40 MG capsule TAKE ONE CAPSULE BY MOUTH DAILY Patient taking differently: Take 20 mg by mouth daily.  03/07/19   Birdie Sons, MD  potassium chloride (KLOR-CON) 10 MEQ tablet TAKE 1 TABLET(10 MEQ) BY MOUTH DAILY 11/02/19   Birdie Sons, MD  traMADol (ULTRAM) 50 MG tablet Take 1 tablet (50 mg total) by mouth every 8 (eight) hours as needed. Patient not taking: Reported on 09/19/2019 07/06/18   Birdie Sons, MD  Travoprost, BAK Free, (TRAVATAN Z) 0.004 % SOLN ophthalmic solution Place 1 drop into both eyes at bedtime.     [provider]     Allergies Cephalexin, Aricept [donepezil], Prednisone, Sulfa antibiotics, and Penicillins   Family History  Problem Relation Age of Onset  . Breast cancer Sister   . Early death Mother   . Stroke Father   . Heart disease Brother   . Heart attack Brother   . Heart disease Brother   . Heart attack Brother     Social History Social History   Tobacco Use  . Smoking status: Never Smoker  . Smokeless tobacco: Never Used  Substance Use Topics  . Alcohol use: No  . Drug use: No    Review of Systems Level 5 Caveat: Portions of the History and Physical including HPI and review of systems are unable to be  completely obtained due to patient being a poor historian   Constitutional:   No known fever.  ENT:   No rhinorrhea. Cardiovascular:   No chest pain or syncope. Respiratory:   No dyspnea or cough. Gastrointestinal:   Negative for abdominal pain, vomiting and diarrhea.  Musculoskeletal:   Negative for focal pain or swelling ____________________________________________   PHYSICAL EXAM:  VITAL SIGNS: ED Triage Vitals  Enc Vitals Group     BP 11/16/19 1547 100/67     Pulse Rate 11/16/19 1547 73     Resp 11/16/19 1547 16     Temp 11/16/19 1547 98.5 F (36.9 C)     Temp src --      SpO2 11/16/19 1547 99 %     Weight 11/16/19 1549 120 lb (54.4 kg)     Height 11/16/19 1549 5\' 2"  (1.575 m)     Head Circumference --  Peak Flow --      Pain Score --      Pain Loc --      Pain Edu? --      Excl. in GC? --     Vital signs reviewed, nursing assessments reviewed.   Constitutional: Awake and alert.  Oriented to self.. Non-toxic appearance. Eyes:   Conjunctivae are normal. EOMI. PERRL. ENT      Head:   Normocephalic and atraumatic.      Nose:   No congestion/rhinnorhea.       Mouth/Throat:   Dry mucous membranes, no pharyngeal erythema. No peritonsillar mass.       Neck:   No meningismus. Full ROM. Hematological/Lymphatic/Immunilogical:   No cervical lymphadenopathy. Cardiovascular:   RRR. Symmetric bilateral radial and DP pulses.  No murmurs. Cap refill less than 2 seconds. Respiratory:   Normal respiratory effort without tachypnea/retractions. Breath sounds are clear and equal bilaterally. No wheezes/rales/rhonchi. Gastrointestinal:   Soft with epigastric tenderness. Non distended. There is no CVA tenderness.  No rebound, rigidity, or guarding.  Musculoskeletal:   Normal range of motion in all extremities. No joint effusions.  No lower extremity tenderness.  No edema. Neurologic:   Normal speech and language.  Follows commands Motor grossly intact. No acute focal neurologic  deficits are appreciated.  Skin:    Skin is warm, dry and intact. No rash noted.  No petechiae, purpura, or bullae.  ____________________________________________    LABS (pertinent positives/negatives) (all labs ordered are listed, but only abnormal results are displayed) Labs Reviewed  BASIC METABOLIC PANEL - Abnormal; Notable for the following components:      Result Value   Glucose, Bld 140 (*)    BUN 40 (*)    Creatinine, Ser 1.07 (*)    GFR calc non Af Amer 47 (*)    GFR calc Af Amer 54 (*)    All other components within normal limits  CBC - Abnormal; Notable for the following components:   WBC 14.7 (*)    RBC 3.84 (*)    MCV 100.8 (*)    RDW 11.4 (*)    All other components within normal limits  TROPONIN I (HIGH SENSITIVITY) - Abnormal; Notable for the following components:   Troponin I (High Sensitivity) 49 (*)    All other components within normal limits  URINALYSIS, COMPLETE (UACMP) WITH MICROSCOPIC  POC SARS CORONAVIRUS 2 AG -  ED  POC SARS CORONAVIRUS 2 AG  TROPONIN I (HIGH SENSITIVITY)   ____________________________________________   EKG  Interpreted by me  Date: 11/16/2019  Rate: 68  Rhythm: normal sinus rhythm  QRS Axis: normal  Intervals: normal  ST/T Wave abnormalities: normal  Conduction Disutrbances: none  Narrative Interpretation: unremarkable      ____________________________________________    RADIOLOGY  CT Head Wo Contrast  Result Date: 11/16/2019 CLINICAL DATA:  Head trauma, minor (Age >= 65y) Dementia patient with weakness and decreased appetite, decreased activity. EXAM: CT HEAD WITHOUT CONTRAST TECHNIQUE: Contiguous axial images were obtained from the base of the skull through the vertex without intravenous contrast. COMPARISON:  Remote exam 01/02/2010 FINDINGS: Brain: No intracranial hemorrhage, mass effect, or midline shift. Generalized atrophy. No hydrocephalus. The basilar cisterns are patent. Moderate periventricular and deep  white matter hypodensity most consistent with chronic small vessel ischemia. No evidence of territorial infarct or acute ischemia. No extra-axial or intracranial fluid collection. Vascular: Atherosclerosis of skullbase vasculature without hyperdense vessel or abnormal calcification. Skull: No fracture or focal lesion. Sinuses/Orbits: No acute  findings. Bilateral cataract resection. Other: None. IMPRESSION: 1. No acute intracranial abnormality. No skull fracture. 2. Generalized atrophy and chronic small vessel ischemia. Electronically Signed   By: Narda Rutherford M.D.   On: 11/16/2019 22:26    ____________________________________________   PROCEDURES Procedures  ____________________________________________  DIFFERENTIAL DIAGNOSIS   Dehydration, electrolyte abnormality, UTI, intracranial hemorrhage, Covid  CLINICAL IMPRESSION / ASSESSMENT AND PLAN / ED COURSE  Medications ordered in the ED: Medications  sodium chloride 0.9 % bolus 500 mL (500 mLs Intravenous New Bag/Given 11/16/19 2150)    Pertinent labs & imaging results that were available during my care of the patient were reviewed by me and considered in my medical decision making (see chart for details).   Maria Bush was evaluated in Emergency Department on 11/16/2019 for the symptoms described in the history of present illness. She was evaluated in the context of the global COVID-19 pandemic, which necessitated consideration that the patient might be at risk for infection with the SARS-CoV-2 virus that causes COVID-19. Institutional protocols and algorithms that pertain to the evaluation of patients at risk for COVID-19 are in a state of rapid change based on information released by regulatory bodies including the CDC and federal and state organizations. These policies and algorithms were followed during the patient's care in the ED.   Patient presents with generalized weakness, found to have some suprapubic tenderness on exam.   Vital signs are normal, she is not septic.  May be dehydrated or have a UTI.  Will check labs, CT head, Covid test.  ----------------------------------------- 12:13 AM on 11/17/2019 -----------------------------------------  CT head negative, Covid test negative, labs are reassuring.  Urinalysis is normal.  However, troponin initially was elevated at about 50 and increased to about 90 on repeat assessment.  Discussed with the son who would prefer for patient to be hospitalized for cardiac work-up given her significant generalized weakness which is sudden onset.  Clinical Course as of Nov 16 2307  Thu Nov 16, 2019  2116 MCHC: 33.1 [PS]    Clinical Course User Index [PS] Sharman Cheek, MD     ____________________________________________   FINAL CLINICAL IMPRESSION(S) / ED DIAGNOSES    Final diagnoses:  Generalized weakness     ED Discharge Orders    None      Portions of this note were generated with dragon dictation software. Dictation errors may occur despite best attempts at proofreading.   Sharman Cheek, MD 11/17/19 [phone]

## 2019-11-16 NOTE — ED Notes (Signed)
Pt had saturated brief on. Pt cleaned up and clean brief placed. Pt pulled up in bed.

## 2019-11-16 NOTE — Telephone Encounter (Signed)
L.O.V. was on 09/19/2019 and upcoming appointment on 11/20/2019.

## 2019-11-17 ENCOUNTER — Inpatient Hospital Stay
Admit: 2019-11-17 | Discharge: 2019-11-17 | Disposition: A | Discharge status: Home / Self Care | Payer: Medicare Other | Attending: Cardiology | Admitting: Cardiology

## 2019-11-17 ENCOUNTER — Observation Stay: Payer: Medicare Other

## 2019-11-17 DIAGNOSIS — Z6820 Body mass index (BMI) 20.0-20.9, adult: Secondary | ICD-10-CM | POA: Diagnosis not present

## 2019-11-17 DIAGNOSIS — R627 Adult failure to thrive: Secondary | ICD-10-CM | POA: Diagnosis not present

## 2019-11-17 DIAGNOSIS — N171 Acute kidney failure with acute cortical necrosis: Secondary | ICD-10-CM | POA: Diagnosis not present

## 2019-11-17 DIAGNOSIS — Z7982 Long term (current) use of aspirin: Secondary | ICD-10-CM | POA: Diagnosis not present

## 2019-11-17 DIAGNOSIS — Z803 Family history of malignant neoplasm of breast: Secondary | ICD-10-CM | POA: Diagnosis not present

## 2019-11-17 DIAGNOSIS — Z888 Allergy status to other drugs, medicaments and biological substances status: Secondary | ICD-10-CM | POA: Diagnosis not present

## 2019-11-17 DIAGNOSIS — M5186 Other intervertebral disc disorders, lumbar region: Secondary | ICD-10-CM | POA: Diagnosis not present

## 2019-11-17 DIAGNOSIS — K222 Esophageal obstruction: Secondary | ICD-10-CM | POA: Diagnosis not present

## 2019-11-17 DIAGNOSIS — Z882 Allergy status to sulfonamides status: Secondary | ICD-10-CM | POA: Diagnosis not present

## 2019-11-17 DIAGNOSIS — Z79891 Long term (current) use of opiate analgesic: Secondary | ICD-10-CM | POA: Diagnosis not present

## 2019-11-17 DIAGNOSIS — R531 Weakness: Secondary | ICD-10-CM

## 2019-11-17 DIAGNOSIS — R778 Other specified abnormalities of plasma proteins: Secondary | ICD-10-CM

## 2019-11-17 DIAGNOSIS — I10 Essential (primary) hypertension: Secondary | ICD-10-CM | POA: Diagnosis not present

## 2019-11-17 DIAGNOSIS — E860 Dehydration: Secondary | ICD-10-CM | POA: Diagnosis not present

## 2019-11-17 DIAGNOSIS — R64 Cachexia: Secondary | ICD-10-CM | POA: Diagnosis present

## 2019-11-17 DIAGNOSIS — M6281 Muscle weakness (generalized): Secondary | ICD-10-CM | POA: Diagnosis not present

## 2019-11-17 DIAGNOSIS — Z741 Need for assistance with personal care: Secondary | ICD-10-CM | POA: Diagnosis not present

## 2019-11-17 DIAGNOSIS — R2681 Unsteadiness on feet: Secondary | ICD-10-CM | POA: Diagnosis not present

## 2019-11-17 DIAGNOSIS — R41841 Cognitive communication deficit: Secondary | ICD-10-CM | POA: Diagnosis not present

## 2019-11-17 DIAGNOSIS — Z881 Allergy status to other antibiotic agents status: Secondary | ICD-10-CM | POA: Diagnosis not present

## 2019-11-17 DIAGNOSIS — K219 Gastro-esophageal reflux disease without esophagitis: Secondary | ICD-10-CM | POA: Diagnosis present

## 2019-11-17 DIAGNOSIS — Z9842 Cataract extraction status, left eye: Secondary | ICD-10-CM | POA: Diagnosis not present

## 2019-11-17 DIAGNOSIS — Z880 Allergy status to penicillin: Secondary | ICD-10-CM | POA: Diagnosis not present

## 2019-11-17 DIAGNOSIS — K2100 Gastro-esophageal reflux disease with esophagitis, without bleeding: Secondary | ICD-10-CM | POA: Diagnosis not present

## 2019-11-17 DIAGNOSIS — Z823 Family history of stroke: Secondary | ICD-10-CM | POA: Diagnosis not present

## 2019-11-17 DIAGNOSIS — R05 Cough: Secondary | ICD-10-CM

## 2019-11-17 DIAGNOSIS — Z8249 Family history of ischemic heart disease and other diseases of the circulatory system: Secondary | ICD-10-CM | POA: Diagnosis not present

## 2019-11-17 DIAGNOSIS — N170 Acute kidney failure with tubular necrosis: Secondary | ICD-10-CM | POA: Diagnosis not present

## 2019-11-17 DIAGNOSIS — Z90710 Acquired absence of both cervix and uterus: Secondary | ICD-10-CM | POA: Diagnosis not present

## 2019-11-17 DIAGNOSIS — R1311 Dysphagia, oral phase: Secondary | ICD-10-CM | POA: Diagnosis not present

## 2019-11-17 DIAGNOSIS — F0280 Dementia in other diseases classified elsewhere without behavioral disturbance: Secondary | ICD-10-CM

## 2019-11-17 DIAGNOSIS — N179 Acute kidney failure, unspecified: Secondary | ICD-10-CM | POA: Diagnosis present

## 2019-11-17 DIAGNOSIS — Z9049 Acquired absence of other specified parts of digestive tract: Secondary | ICD-10-CM | POA: Diagnosis not present

## 2019-11-17 DIAGNOSIS — Z79899 Other long term (current) drug therapy: Secondary | ICD-10-CM | POA: Diagnosis not present

## 2019-11-17 DIAGNOSIS — Z9841 Cataract extraction status, right eye: Secondary | ICD-10-CM | POA: Diagnosis not present

## 2019-11-17 DIAGNOSIS — R7989 Other specified abnormal findings of blood chemistry: Secondary | ICD-10-CM | POA: Diagnosis not present

## 2019-11-17 DIAGNOSIS — G309 Alzheimer's disease, unspecified: Secondary | ICD-10-CM

## 2019-11-17 DIAGNOSIS — Z20822 Contact with and (suspected) exposure to covid-19: Secondary | ICD-10-CM | POA: Diagnosis present

## 2019-11-17 DIAGNOSIS — F0390 Unspecified dementia without behavioral disturbance: Secondary | ICD-10-CM | POA: Diagnosis not present

## 2019-11-17 LAB — COMPREHENSIVE METABOLIC PANEL
ALT: 21 U/L (ref 0–44)
AST: 30 U/L (ref 15–41)
Albumin: 3.4 g/dL — ABNORMAL LOW (ref 3.5–5.0)
Alkaline Phosphatase: 52 U/L (ref 38–126)
Anion gap: 13 (ref 5–15)
BUN: 56 mg/dL — ABNORMAL HIGH (ref 8–23)
CO2: 26 mmol/L (ref 22–32)
Calcium: 8.6 mg/dL — ABNORMAL LOW (ref 8.9–10.3)
Chloride: 103 mmol/L (ref 98–111)
Creatinine, Ser: 1.47 mg/dL — ABNORMAL HIGH (ref 0.44–1.00)
GFR calc Af Amer: 37 mL/min — ABNORMAL LOW (ref >60)
GFR calc non Af Amer: 32 mL/min — ABNORMAL LOW (ref >60)
Glucose, Bld: 114 mg/dL — ABNORMAL HIGH (ref 70–99)
Potassium: 3.9 mmol/L (ref 3.5–5.1)
Sodium: 142 mmol/L (ref 135–145)
Total Bilirubin: 1.8 mg/dL — ABNORMAL HIGH (ref 0.3–1.2)
Total Protein: 7.4 g/dL (ref 6.5–8.1)

## 2019-11-17 LAB — CBC
HCT: 35.3 % — ABNORMAL LOW (ref 36.0–46.0)
Hemoglobin: 12.2 g/dL (ref 12.0–15.0)
MCH: 33.7 pg (ref 26.0–34.0)
MCHC: 34.6 g/dL (ref 30.0–36.0)
MCV: 97.5 fL (ref 80.0–100.0)
Platelets: 276 10*3/uL (ref 150–400)
RBC: 3.62 MIL/uL — ABNORMAL LOW (ref 3.87–5.11)
RDW: 11.5 % (ref 11.5–15.5)
WBC: 13.3 10*3/uL — ABNORMAL HIGH (ref 4.0–10.5)
nRBC: 0 % (ref 0.0–0.2)

## 2019-11-17 LAB — LIPID PANEL
Cholesterol: 149 mg/dL (ref 0–200)
HDL: 31 mg/dL — ABNORMAL LOW (ref >40)
LDL Cholesterol: 102 mg/dL — ABNORMAL HIGH (ref 0–99)
Total CHOL/HDL Ratio: 4.8 RATIO
Triglycerides: 81 mg/dL (ref <150)
VLDL: 16 mg/dL (ref 0–40)

## 2019-11-17 LAB — TROPONIN I (HIGH SENSITIVITY): Troponin I (High Sensitivity): 65 ng/L — ABNORMAL HIGH (ref <18)

## 2019-11-17 LAB — SARS CORONAVIRUS 2 (TAT 6-24 HRS): SARS Coronavirus 2: NEGATIVE

## 2019-11-17 MED ORDER — CALCIUM CARBONATE-VITAMIN D 500-200 MG-UNIT PO TABS
1.0000 | ORAL_TABLET | Freq: Every day | ORAL | Status: DC
  Administered 2019-11-18: 1 via ORAL
  Filled 2019-11-17: qty 1

## 2019-11-17 MED ORDER — ENOXAPARIN SODIUM 30 MG/0.3ML ~~LOC~~ SOLN
30.0000 mg | SUBCUTANEOUS | Status: DC
  Filled 2019-11-17: qty 0.3

## 2019-11-17 MED ORDER — ORAL CARE MOUTH RINSE
15.0000 mL | Freq: Two times a day (BID) | OROMUCOSAL | Status: DC
  Administered 2019-11-17 – 2019-11-21 (×5): 15 mL via OROMUCOSAL

## 2019-11-17 MED ORDER — AMLODIPINE BESYLATE 5 MG PO TABS
5.0000 mg | ORAL_TABLET | Freq: Every day | ORAL | Status: DC
  Filled 2019-11-17: qty 1

## 2019-11-17 MED ORDER — LISINOPRIL 20 MG PO TABS
20.0000 mg | ORAL_TABLET | Freq: Every day | ORAL | Status: DC
  Filled 2019-11-17: qty 2

## 2019-11-17 MED ORDER — MIRABEGRON ER 25 MG PO TB24
25.0000 mg | ORAL_TABLET | Freq: Every day | ORAL | Status: DC
  Administered 2019-11-17 – 2019-11-21 (×4): 25 mg via ORAL
  Filled 2019-11-17 (×5): qty 1

## 2019-11-17 MED ORDER — CALCIUM CARBONATE-VITAMIN D 500-200 MG-UNIT PO TABS
1.0000 | ORAL_TABLET | Freq: Every day | ORAL | Status: DC
  Administered 2019-11-17 – 2019-11-21 (×5): 1 via ORAL
  Filled 2019-11-17 (×6): qty 1

## 2019-11-17 MED ORDER — ZOLPIDEM TARTRATE 5 MG PO TABS: 5.0000 mg | ORAL_TABLET | Freq: Every evening | ORAL | Status: DC | PRN

## 2019-11-17 MED ORDER — ACETAMINOPHEN 325 MG PO TABS: 650.0000 mg | ORAL_TABLET | ORAL | Status: DC | PRN

## 2019-11-17 MED ORDER — FERROUS SULFATE 325 (65 FE) MG PO TABS
325.0000 mg | ORAL_TABLET | Freq: Every day | ORAL | Status: DC
  Administered 2019-11-17 – 2019-11-21 (×5): 325 mg via ORAL
  Filled 2019-11-17 (×6): qty 1

## 2019-11-17 MED ORDER — SODIUM CHLORIDE 0.9 % IV SOLN: INTRAVENOUS | Status: DC

## 2019-11-17 MED ORDER — LATANOPROST 0.005 % OP SOLN: 1.0000 [drp] | Freq: Every day | OPHTHALMIC | Status: DC

## 2019-11-17 MED ORDER — ASPIRIN 81 MG PO TBEC: 81.0000 mg | DELAYED_RELEASE_TABLET | Freq: Every day | ORAL | Status: DC

## 2019-11-17 MED ORDER — TRAMADOL HCL 50 MG PO TABS: 50.0000 mg | ORAL_TABLET | Freq: Two times a day (BID) | ORAL | Status: DC | PRN

## 2019-11-17 MED ORDER — LATANOPROST 0.005 % OP SOLN
1.0000 [drp] | Freq: Every day | OPHTHALMIC | Status: DC
  Administered 2019-11-17 – 2019-11-19 (×3): 1 [drp] via OPHTHALMIC
  Filled 2019-11-17 (×2): qty 2.5

## 2019-11-17 MED ORDER — ATENOLOL 25 MG PO TABS
25.0000 mg | ORAL_TABLET | Freq: Every day | ORAL | Status: DC
  Filled 2019-11-17 (×2): qty 1

## 2019-11-17 MED ORDER — MIRABEGRON ER 50 MG PO TB24: 50.0000 mg | ORAL_TABLET | Freq: Every day | ORAL | Status: DC

## 2019-11-17 MED ORDER — ENOXAPARIN SODIUM 30 MG/0.3ML ~~LOC~~ SOLN
30.0000 mg | SUBCUTANEOUS | Status: DC
  Administered 2019-11-17 – 2019-11-19 (×3): 30 mg via SUBCUTANEOUS
  Filled 2019-11-17 (×4): qty 0.3

## 2019-11-17 MED ORDER — POTASSIUM CHLORIDE ER 10 MEQ PO TBCR: 10.0000 meq | EXTENDED_RELEASE_TABLET | Freq: Every day | ORAL | Status: DC

## 2019-11-17 MED ORDER — MIRTAZAPINE 15 MG PO TABS
30.0000 mg | ORAL_TABLET | Freq: Every day | ORAL | Status: DC
  Administered 2019-11-17 – 2019-11-20 (×2): 30 mg via ORAL
  Filled 2019-11-17 (×2): qty 2

## 2019-11-17 MED ORDER — ONDANSETRON HCL 4 MG/2ML IJ SOLN: 4.0000 mg | Freq: Four times a day (QID) | INTRAMUSCULAR | Status: DC | PRN

## 2019-11-17 MED ORDER — PANTOPRAZOLE SODIUM 40 MG PO TBEC
40.0000 mg | DELAYED_RELEASE_TABLET | Freq: Every day | ORAL | Status: DC
  Administered 2019-11-17 – 2019-11-21 (×5): 40 mg via ORAL
  Filled 2019-11-17 (×6): qty 1

## 2019-11-17 MED ORDER — LORATADINE 10 MG PO TABS
10.0000 mg | ORAL_TABLET | Freq: Every day | ORAL | Status: DC
  Administered 2019-11-17 – 2019-11-21 (×5): 10 mg via ORAL
  Filled 2019-11-17 (×6): qty 1

## 2019-11-17 MED ORDER — GUAIFENESIN ER 600 MG PO TB12
600.0000 mg | ORAL_TABLET | Freq: Two times a day (BID) | ORAL | Status: DC
  Administered 2019-11-17 – 2019-11-20 (×8): 600 mg via ORAL
  Filled 2019-11-17 (×10): qty 1

## 2019-11-17 MED ORDER — ASPIRIN EC 81 MG PO TBEC
81.0000 mg | DELAYED_RELEASE_TABLET | Freq: Every day | ORAL | Status: DC
  Administered 2019-11-17 – 2019-11-21 (×5): 81 mg via ORAL
  Filled 2019-11-17 (×6): qty 1

## 2019-11-17 NOTE — ED Notes (Signed)
Pt continues to remove her cardiac leads. No resp distresss. Pt is resting but hands are fidgety. Son at the bedside.

## 2019-11-17 NOTE — ED Notes (Signed)
Patient is resting comfortably. 

## 2019-11-17 NOTE — ED Notes (Signed)
Report given. Pending transport.

## 2019-11-17 NOTE — Progress Notes (Signed)
*  PRELIMINARY RESULTS* Echocardiogram 2D Echocardiogram has been performed.  Joanette Gula Chesney Suares 11/17/2019, 1:37 PM

## 2019-11-17 NOTE — H&P (Signed)
[place] at [facility]   PATIENT NAME: Maria Bush    MR#:  [id number]  DATE OF BIRTH:  [birth year]  DATE OF ADMISSION:  11/16/2018  PRIMARY CARE PHYSICIAN: Malva Limes, MD   REQUESTING/REFERRING PHYSICIAN: Bayard Males, MD CHIEF COMPLAINT:   Chief Complaint  Patient presents with  . Weakness    HISTORY OF PRESENT ILLNESS:  Maria Bush  is a 86 y.o. female with a known history of dementia and diverticulosis, who presented to the emergency room with acute onset of generalized weakness.  Her son for the last couple of days with significant anorexia.  She did not have any reported nausea or vomiting or abdominal pain.  No reported chest pain or dyspnea however she had mild cough with no fever or chills.  He is not aware of urinary symptoms however noted that her urine smells strong.  The patient is a nonhistorian due to her dementia.  Upon presentation to the emergency room, vital signs were within normal.  Labs were remarkable for a BUN of 40 with a creatinine 1.07 and leukocytosis of 14.7 with macrocytosis.  High-sensitivity troponin I was 49 and later 87.  Her COVID-19 antigen came back negative and PCR is pending.  Urinalysis was unremarkable EKG showed no sinus rhythm with rate of 68 with anteroseptal T wave inversion.  The patient was given 500 mill IV normal saline bolus.  She will be admitted to an observation medical monitored bed for further evaluation and management. PAST MEDICAL HISTORY:   Past Medical History:  Diagnosis Date  . Acute blood loss anemia   . Arthritis 2005  . Diverticulosis   . GI bleed   . Heart murmur 2005  . Hypertension   . Memory loss     PAST SURGICAL HISTORY:   Past Surgical History:  Procedure Laterality Date  . ABDOMINAL HYSTERECTOMY  1981  . BLADDER SURGERY  1992  . BREAST BIOPSY  1992  . CARDIAC CATHETERIZATION  2011  . CATARACT EXTRACTION Bilateral 2015  . CHOLECYSTECTOMY  08/09/2000  . COLONOSCOPY  1982    . DILATION AND CURETTAGE OF UTERUS  1979  . GIVENS CAPSULE STUDY N/A 03/27/2017   Procedure: GIVENS CAPSULE STUDY;  Surgeon: Bernette Redbird, MD;  Location: [facility] ENDOSCOPY;  Service: Endoscopy;  Laterality: N/A;  . RECTOCELE REPAIR  1994    SOCIAL HISTORY:   Social History   Tobacco Use  . Smoking status: Never Smoker  . Smokeless tobacco: Never Used  Substance Use Topics  . Alcohol use: No    FAMILY HISTORY:   Family History  Problem Relation Age of Onset  . Breast cancer Sister   . Early death Mother   . Stroke Father   . Heart disease Brother   . Heart attack Brother   . Heart disease Brother   . Heart attack Brother     DRUG ALLERGIES:   Allergies  Allergen Reactions  . Cephalexin Shortness Of Breath  . Aricept [Donepezil]     Hallucinations   . Prednisone Nausea And Vomiting  . Sulfa Antibiotics Itching  . Penicillins Rash    REVIEW OF SYSTEMS:   ROS As per history of present illness. All pertinent systems were reviewed above. Constitutional,  HEENT, cardiovascular, respiratory, GI, GU, musculoskeletal, neuro, psychiatric, endocrine,  integumentary and hematologic systems were reviewed and are otherwise  negative/unremarkable except for positive findings mentioned above in the HPI.   MEDICATIONS AT HOME:   Prior to Admission medications  Medication Sig Start Date End Date Taking? Authorizing Provider  amLODipine (NORVASC) 5 MG tablet Take 5 mg by mouth daily.  09/01/12  Yes [provider]  aspirin 81 MG EC tablet Take 81 mg by mouth daily. 09/01/12  Yes [provider]  atenolol (TENORMIN) 25 MG tablet TAKE 1 TABLET BY MOUTH DAILY 08/04/19  Yes Birdie Sons, MD  Calcium Carbonate-Vitamin D 600-200 MG-UNIT TABS Take 1 tablet by mouth daily.   Yes [provider]  Calcium Carbonate-Vitamin D3 600-400 MG-UNIT TABS Take 1 tablet by mouth daily. 10/31/18  Yes Birdie Sons, MD  chlorthalidone (HYGROTON) 25 MG tablet TAKE 1  TABLET(25 MG) BY MOUTH DAILY 02/05/19  Yes Birdie Sons, MD  FEROSUL 325 (65 Fe) MG tablet TAKE 1 TABLET(325 MG) BY MOUTH DAILY 11/16/19  Yes Birdie Sons, MD  furosemide (LASIX) 20 MG tablet TAKE 1 TABLET BY MOUTH EVERY DAY 08/04/19  Yes Birdie Sons, MD  lisinopril (PRINIVIL,ZESTRIL) 20 MG tablet Take 1 tablet (20 mg total) by mouth daily. 01/23/19  Yes Birdie Sons, MD  loratadine (CLARITIN) 10 MG tablet Take by mouth daily as needed.    Yes [provider]  mirtazapine (REMERON) 30 MG tablet Take 30 mg by mouth at bedtime.  06/19/14  Yes [provider]  nitrofurantoin (MACRODANTIN) 100 MG capsule Take 1 capsule (100 mg total) by mouth 4 (four) times daily. 10/02/19  Yes MacDiarmid, Nicki Reaper, MD  omeprazole (PRILOSEC) 40 MG capsule TAKE ONE CAPSULE BY MOUTH DAILY Patient taking differently: Take 20 mg by mouth daily.  03/07/19  Yes Birdie Sons, MD  potassium chloride (KLOR-CON) 10 MEQ tablet TAKE 1 TABLET(10 MEQ) BY MOUTH DAILY 11/02/19  Yes Birdie Sons, MD  latanoprost (XALATAN) 0.005 % ophthalmic solution Place 1 drop into both eyes at bedtime.    [provider]  mirabegron ER (MYRBETRIQ) 50 MG TB24 tablet Take 1 tablet (50 mg total) by mouth daily. Patient not taking: Reported on 09/19/2019 08/21/19   Bjorn Loser, MD  mometasone (ELOCON) 0.1 % cream Apply 1 application topically daily. As needed for neck/posterior ear rash Patient not taking: Reported on 06/20/2019 05/13/18   Carmon Ginsberg, PA  traMADol (ULTRAM) 50 MG tablet Take 1 tablet (50 mg total) by mouth every 8 (eight) hours as needed. Patient not taking: Reported on 09/19/2019 07/06/18   Birdie Sons, MD  Travoprost, BAK Free, (TRAVATAN Z) 0.004 % SOLN ophthalmic solution Place 1 drop into both eyes at bedtime.     [provider]      VITAL SIGNS:  Blood pressure (!) 143/74, pulse (!) 59, temperature 98.5 F (36.9 C), resp. rate 16, height 5\' 2"  (1.575 m), weight 54.4  kg, SpO2 100 %.  PHYSICAL EXAMINATION:  Physical Exam  GENERAL:  86 y.o.-year-old African-American lethargic female patient lying in the bed with no acute distress.  EYES: Pupils equal, round, reactive to light and accommodation. No scleral icterus. Extraocular muscles intact.  HEENT: Head atraumatic, normocephalic. Oropharynx and nasopharynx clear.  NECK:  Supple, no jugular venous distention. No thyroid enlargement, no tenderness.  LUNGS: Normal breath sounds bilaterally, no wheezing, rales,rhonchi or crepitation. No use of accessory muscles of respiration.  CARDIOVASCULAR: Regular rate and rhythm, S1, S2 normal. No murmurs, rubs, or gallops.  ABDOMEN: Soft, nondistended, nontender. Bowel sounds present. No organomegaly or mass.  EXTREMITIES: No pedal edema, cyanosis, or clubbing.  NEUROLOGIC: Cranial nerves II through XII are intact. Muscle strength 5/5 in  all extremities. Sensation intact. Gait not checked.  SKIN: No obvious rash, lesion, or ulcer.   LABORATORY PANEL:   CBC Recent Labs  Lab 11/16/19 1559  WBC 14.7*  HGB 12.8  HCT 38.7  PLT 298   ------------------------------------------------------------------------------------------------------------------  Chemistries  Recent Labs  Lab 11/16/19 1559  NA 139  K 4.0  CL 101  CO2 25  GLUCOSE 140*  BUN 40*  CREATININE 1.07*  CALCIUM 9.1   ------------------------------------------------------------------------------------------------------------------  Cardiac Enzymes No results for input(s): TROPONINI in the last 168 hours. ------------------------------------------------------------------------------------------------------------------  RADIOLOGY:  CT Head Wo Contrast  Result Date: 11/16/2019 CLINICAL DATA:  Head trauma, minor (Age >= 65y) Dementia patient with weakness and decreased appetite, decreased activity. EXAM: CT HEAD WITHOUT CONTRAST TECHNIQUE: Contiguous axial images were obtained from the base of  the skull through the vertex without intravenous contrast. COMPARISON:  Remote exam 01/02/2010 FINDINGS: Brain: No intracranial hemorrhage, mass effect, or midline shift. Generalized atrophy. No hydrocephalus. The basilar cisterns are patent. Moderate periventricular and deep white matter hypodensity most consistent with chronic small vessel ischemia. No evidence of territorial infarct or acute ischemia. No extra-axial or intracranial fluid collection. Vascular: Atherosclerosis of skullbase vasculature without hyperdense vessel or abnormal calcification. Skull: No fracture or focal lesion. Sinuses/Orbits: No acute findings. Bilateral cataract resection. Other: None. IMPRESSION: 1. No acute intracranial abnormality. No skull fracture. 2. Generalized atrophy and chronic small vessel ischemia. Electronically Signed   By: Narda Rutherford M.D.   On: 11/16/2019 22:26      IMPRESSION AND PLAN:   1.  Generalized weakness.  The patient will be admitted to an observation telemetry bed.  Physical therapy consult will be obtained.  Given elevated troponin I we will need to rule out acute coronary syndrome.  2.  Elevated troponin I.  This could be contributing to #1.  Patient will be followed with serial troponin levels.  Will obtain a 2D echo and a cardiology consultation in a.m.  We will continue aspirin and beta-blocker therapy as well as ACE inhibitor therapy.  3.  Cough.  Will obtain chest x-ray and place the patient on p.o. Mucinex.  4.  Dehydration.  This could be contributing to #1.  Will place on hydration with IV normal saline and hold diuretic therapy.  5.  Hypertension.  We will continue Zestril and amlodipine as well as atenolol.  6.  GERD.  We will continue PPI therapy.  7.  DVT prophylaxis.  Subcutaneous Lovenox.  All the records are reviewed and case discussed with ED provider. The plan of care was discussed in details with the patient's son and next of kin/POA. I answered all questions.  The patient agreed to proceed with the above mentioned plan. Further management will depend upon hospital course.   CODE STATUS: Full code  TOTAL TIME TAKING CARE OF THIS PATIENT: 55 minutes.    Hannah Beat M.D on 11/17/2019 at 3:35 AM  [facility]   From 7 PM-7 AM, contact night-coverage [email or web address]  CC: Primary care physician; Malva Limes, MD   Note: This dictation was prepared with Dragon dictation along with smaller phrase technology. Any transcriptional errors that result from this process are unintentional.

## 2019-11-17 NOTE — Progress Notes (Addendum)
Patient admitted to unit. Oriented to room, call bell, and staff. Patient pleasantly confused at baseline per son. Bed in lowest position - low bed ordered. Fall safety plan reviewed. Full assessment to Epic. Skin assessment verified with Amy Dalton RN. Telemetry box verification with tele clerk- Box#: [id number]. Will continue to monitor.

## 2019-11-17 NOTE — Progress Notes (Signed)
[facility]  CARDIOLOGY CONSULT NOTE  Patient ID: Maria Bush MRN: [id number] DOB/AGE: 12/09/1929 86 y.o.  Admit date: 11/16/2019 Referring Physician [facility] Primary Physician [place] Primary Cardiologist Fath Reason for Consultation elevated troponin  HPI: 86 year old female referred for evaluation of elevated troponin.  She has a history of dementia, currently lives with her son, is brought to [facility] ED with chief complaint of loss of appetite, and generalized weakness.  Patient denies chest pain or shortness of breath.  ECG reveals sinus rhythm, normal ECG.  Admission labs notable for elevated BUN and creatinine of 40 and 1.07, with follow-up, 56 and 1.47.  Elevated white count of 14.7 also noted.  High-sensitivity troponin was 49, 87 and 65.  The patient treated with intravenous fluid resuscitation with modest clinical overall improvement.  Of note, patient had previous ED evaluation 07/28/2018 at which time patient also had borderline elevated troponin.  Review of systems complete and found to be negative unless listed above     Past Medical History:  Diagnosis Date  . Acute blood loss anemia   . Arthritis 2005  . Diverticulosis   . GI bleed   . Heart murmur 2005  . Hypertension   . Memory loss     Past Surgical History:  Procedure Laterality Date  . ABDOMINAL HYSTERECTOMY  1981  . BLADDER SURGERY  1992  . BREAST BIOPSY  1992  . CARDIAC CATHETERIZATION  2011  . CATARACT EXTRACTION Bilateral 2015  . CHOLECYSTECTOMY  08/09/2000  . COLONOSCOPY  1982  . DILATION AND CURETTAGE OF UTERUS  1979  . GIVENS CAPSULE STUDY N/A 03/27/2017   Procedure: GIVENS CAPSULE STUDY;  Surgeon: Ronald Lobo, MD;  Location: [facility] ENDOSCOPY;  Service: Endoscopy;  Laterality: N/A;  . [place]    (Not in a hospital admission)  Social History   Socioeconomic History  . Marital status: Widowed    Spouse name: Not on file  . Number of children: 2  . Years of education: HS  .  Highest education level: High school graduate  Occupational History  . 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 Smoking status: Never Smoker  . Smokeless tobacco: Never Used  Substance and Sexual Activity  . Alcohol use: No  . Drug use: No  . Sexual activity: Not on file  Other Topics Concern  . Not on file  Social History Narrative   Pt lives in [place]. Her daughter lives with her, with 3 grandchildren 18, 49, 50. Her daughter is a Gaffer. Pt takes care of her grandchildren at home.    Social Determinants of Health   Financial Resource Strain:   . Difficulty of Paying Living Expenses: Not on file  Food Insecurity:   . Worried About Charity fundraiser in the Last Year: Not on file  . Ran Out of Food in the Last Year: Not on file  Transportation Needs:   . Lack of Transportation (Medical): Not on file  . Lack of Transportation (Non-Medical): Not on file  Physical Activity: Inactive  . Days of Exercise per Week: 0 days  . Minutes of Exercise per Session: 0 min  Stress:   . Feeling of Stress : Not on file  Social Connections: Unknown  . Frequency of Communication with Friends and Family: Patient refused  . Frequency of Social Gatherings with Friends and Family: Patient refused  . Attends Religious Services: Patient refused  . Active Member of Clubs or Organizations:  Patient refused  . Attends Banker Meetings: Patient refused  . Marital Status: Patient refused  Intimate Partner Violence: Unknown  . Fear of Current or Ex-Partner: Patient refused  . Emotionally Abused: Patient refused  . Physically Abused: Patient refused  . Sexually Abused: Patient refused    Family History  Problem Relation Age of Onset  . Breast cancer Sister   . Early death Mother   . Stroke Father   . Heart disease Brother   . Heart attack Brother   . Heart disease Brother   . Heart attack Brother       Review of systems complete and found to be  negative unless listed above      PHYSICAL EXAM  General: Well developed, well nourished, in no acute distress HEENT:  Normocephalic and atramatic Neck:  No JVD.  Lungs: Clear bilaterally to auscultation and percussion. Heart: HRRR . Normal S1 and S2 without gallops or murmurs.  Abdomen: Bowel sounds are positive, abdomen soft and non-tender  Msk:  Back normal, normal gait. Normal strength and tone for age. Extremities: No clubbing, cyanosis or edema.   Neuro: Alert and oriented X 3. Psych:  Good affect, responds appropriately  Labs:   Lab Results  Component Value Date   WBC 13.3 (H) 11/17/2019   HGB 12.2 11/17/2019   HCT 35.3 (L) 11/17/2019   MCV 97.5 11/17/2019   PLT 276 11/17/2019    Recent Labs  Lab 11/17/19 0521  NA 142  K 3.9  CL 103  CO2 26  BUN 56*  CREATININE 1.47*  CALCIUM 8.6*  PROT 7.4  BILITOT 1.8*  ALKPHOS 52  ALT 21  AST 30  GLUCOSE 114*   Lab Results  Component Value Date   CKTOTAL 157 12/05/2013   CKMB 4.8 (H) 12/05/2013   TROPONINI 0.06 (HH) 08/06/2018    Lab Results  Component Value Date   CHOL 149 11/17/2019   CHOL 152 01/29/2016   Lab Results  Component Value Date   HDL 31 (L) 11/17/2019   HDL 55 01/29/2016   Lab Results  Component Value Date   LDLCALC 102 (H) 11/17/2019   LDLCALC 89 01/29/2016   Lab Results  Component Value Date   TRIG 81 11/17/2019   TRIG 40 01/29/2016   Lab Results  Component Value Date   CHOLHDL 4.8 11/17/2019   No results found for: LDLDIRECT    Radiology: DG Chest 1 View  Result Date: 11/17/2019 CLINICAL DATA:  Cough and weakness. EXAM: CHEST  1 VIEW COMPARISON:  08/06/2018 FINDINGS: Borderline cardiomegaly, unchanged. Unchanged mediastinal contours. No pulmonary edema or focal airspace disease. No pneumothorax or large pleural effusion. No acute osseous abnormalities are seen. Chronic changes about the left shoulder. IMPRESSION: Stable borderline cardiomegaly. No acute chest findings.  Electronically Signed   By: Narda Rutherford M.D.   On: 11/17/2019 05:19   CT Head Wo Contrast  Result Date: 11/16/2019 CLINICAL DATA:  Head trauma, minor (Age >= 65y) Dementia patient with weakness and decreased appetite, decreased activity. EXAM: CT HEAD WITHOUT CONTRAST TECHNIQUE: Contiguous axial images were obtained from the base of the skull through the vertex without intravenous contrast. COMPARISON:  Remote exam 01/02/2010 FINDINGS: Brain: No intracranial hemorrhage, mass effect, or midline shift. Generalized atrophy. No hydrocephalus. The basilar cisterns are patent. Moderate periventricular and deep white matter hypodensity most consistent with chronic small vessel ischemia. No evidence of territorial infarct or acute ischemia. No extra-axial or intracranial fluid collection. Vascular: Atherosclerosis of skullbase  vasculature without hyperdense vessel or abnormal calcification. Skull: No fracture or focal lesion. Sinuses/Orbits: No acute findings. Bilateral cataract resection. Other: None. IMPRESSION: 1. No acute intracranial abnormality. No skull fracture. 2. Generalized atrophy and chronic small vessel ischemia. Electronically Signed   By: Narda Rutherford M.D.   On: 11/16/2019 22:26    EKG: Normal sinus rhythm  ASSESSMENT AND PLAN:   1.  Borderline elevated troponin, in the absence of chest pain, or ischemic ECG changes, in the setting of generalized weakness and dehydration, likely demand supply ischemia in patient with history of chronic borderline elevated troponin 2.  Loss of appetite/generalized weakness/dehydration 3.  Acute on chronic renal failure  Recommendations  1.  Agree with current therapy 2.  Agree agree with gentle IV fluid repletion 3.  Defer full dose anticoagulation 4.  Review 2D echocardiogram 5.  Further recommendations pending echocardiogram results  Signed: Marcina Millard MD,PhD, [facility] 11/17/2019, 8:52 AM

## 2019-11-17 NOTE — ED Notes (Signed)
Pt taken to rm 251 by transport team.

## 2019-11-17 NOTE — Progress Notes (Signed)
Message to Dr. Allena Katz regarding Mirabegron dose of 50mg  with a CrCl~21. She agreed to dose change to 25mg  daily.

## 2019-11-17 NOTE — ED Notes (Signed)
Attempted to call report, RN unable to take report at this time. Number provided. Will call back to give report if call not returned. Pt asleep at this time, son at bedside.

## 2019-11-17 NOTE — ED Notes (Signed)
Pt is finally resting soundly. No distress, no fidgeting. Sats are 100%.

## 2019-11-17 NOTE — Progress Notes (Signed)
[facility] NAME: Lidwina Kaner    MR#:  [id number]  DATE OF BIRTH:  10-07-1930  SUBJECTIVE:    REVIEW OF SYSTEMS:   ROS Tolerating Diet: Tolerating PT:   DRUG ALLERGIES:   Allergies  Allergen Reactions  . Cephalexin Shortness Of Breath  . Aricept [Donepezil]     Hallucinations   . Prednisone Nausea And Vomiting  . Sulfa Antibiotics Itching  . Penicillins Rash    VITALS:  Blood pressure 107/67, pulse 61, temperature 97.6 F (36.4 C), temperature source Oral, resp. rate 19, height 5\' 2"  (1.575 m), weight 51.6 kg, SpO2 100 %.  PHYSICAL EXAMINATION:   Physical Exam  GENERAL:  86 y.o.-year-old patient lying in the bed with no acute distress.  EYES: Pupils equal, round, reactive to light and accommodation. No scleral icterus. Extraocular muscles intact.  HEENT: Head atraumatic, normocephalic. Oropharynx and nasopharynx clear.  NECK:  Supple, no jugular venous distention. No thyroid enlargement, no tenderness.  LUNGS: Normal breath sounds bilaterally, no wheezing, rales, rhonchi. No use of accessory muscles of respiration.  CARDIOVASCULAR: S1, S2 normal. No murmurs, rubs, or gallops.  ABDOMEN: Soft, nontender, nondistended. Bowel sounds present. No organomegaly or mass.  EXTREMITIES: No cyanosis, clubbing or edema b/l.    NEUROLOGIC: Cranial nerves II through XII are intact. No focal Motor or sensory deficits b/l.   PSYCHIATRIC:  patient is alert and oriented x 3.  SKIN: No obvious rash, lesion, or ulcer.   LABORATORY PANEL:  CBC Recent Labs  Lab 11/17/19 0521  WBC 13.3*  HGB 12.2  HCT 35.3*  PLT 276    Chemistries  Recent Labs  Lab 11/17/19 0521  NA 142  K 3.9  CL 103  CO2 26  GLUCOSE 114*  BUN 56*  CREATININE 1.47*  CALCIUM 8.6*  AST 30  ALT 21  ALKPHOS 52  BILITOT 1.8*   Cardiac Enzymes No results for input(s): TROPONINI in the last 168 hours. RADIOLOGY:  DG Chest 1 View  Result Date:  11/17/2019 CLINICAL DATA:  Cough and weakness. EXAM: CHEST  1 VIEW COMPARISON:  08/06/2018 FINDINGS: Borderline cardiomegaly, unchanged. Unchanged mediastinal contours. No pulmonary edema or focal airspace disease. No pneumothorax or large pleural effusion. No acute osseous abnormalities are seen. Chronic changes about the left shoulder. IMPRESSION: Stable borderline cardiomegaly. No acute chest findings. Electronically Signed   By: Keith Rake M.D.   On: 11/17/2019 05:19   CT Head Wo Contrast  Result Date: 11/16/2019 CLINICAL DATA:  Head trauma, minor (Age >= 65y) Dementia patient with weakness and decreased appetite, decreased activity. EXAM: CT HEAD WITHOUT CONTRAST TECHNIQUE: Contiguous axial images were obtained from the base of the skull through the vertex without intravenous contrast. COMPARISON:  Remote exam 01/02/2010 FINDINGS: Brain: No intracranial hemorrhage, mass effect, or midline shift. Generalized atrophy. No hydrocephalus. The basilar cisterns are patent. Moderate periventricular and deep white matter hypodensity most consistent with chronic small vessel ischemia. No evidence of territorial infarct or acute ischemia. No extra-axial or intracranial fluid collection. Vascular: Atherosclerosis of skullbase vasculature without hyperdense vessel or abnormal calcification. Skull: No fracture or focal lesion. Sinuses/Orbits: No acute findings. Bilateral cataract resection. Other: None. IMPRESSION: 1. No acute intracranial abnormality. No skull fracture. 2. Generalized atrophy and chronic small vessel ischemia. Electronically Signed   By: Keith Rake M.D.   On: 11/16/2019 22:26   ASSESSMENT AND PLAN:  Athalee Esterline  is a 86 y.o. female with a known history  of dementia and diverticulosis, who presented to the emergency room with acute onset of generalized weakness.  Her son rpeorted taht  for the last couple of days with significant anorexia.  She did not have any reported nausea or  vomiting or abdominal pain  1. Acute renal failure with  Generalized weakness.   -appears prerenal azotemia poor PO intake -continue IV fluids -came in with creatinine of 1.07--1.47 -avoid nephrotoxic agents  2.  Elevated troponin I.   -patient denies any chest pain. She has significant dementia. -Seen by cardiology continue conservative management - will continue aspirin   3.   dementia, failure to thrive with poor PO intake -discussed with patient's son who agrees with patient having poor appetite for last few weeks. Will continue to encourage to feed patient. -IV fluids for now  4.   generalized weakness -seen by physical therapy recommends rehab. Son is in agreement with it  5.  Hypertension.   -patient has relative hypotension hold blood pressure meds  6.  GERD.  We will continue PPI therapy.  7.  DVT prophylaxis.  Subcutaneous Lovenox.  Procedures: none Family communication : discussed with son Kewana Sanon on the phone Consults : Discharge Disposition : to be determined CODE STATUS: full code per son DVT Prophylaxis :  TOTAL TIME TAKING CARE OF THIS PATIENT: *30* minutes.  >50% time spent on counselling and coordination of care  POSSIBLE D/C IN **2 to 3* DAYS, DEPENDING ON CLINICAL CONDITION.  Note: This dictation was prepared with Dragon dictation along with smaller phrase technology. Any transcriptional errors that result from this process are unintentional.  Enedina Finner M.D on 11/17/2019 at 4:31 PM  Between 7am to 6pm - Pager - [phone]  After 6pm go to [email or web address]  [facility]   CC: Primary care physician; Malva Limes, MDPatient ID: Alexandria Lodge, female   DOB: 24-Sep-1930, 86 y.o.   MRN: [id number]

## 2019-11-17 NOTE — ED Notes (Signed)
Pt placed on 2L of O2 for comfort. Sats are 94%.

## 2019-11-17 NOTE — ED Notes (Signed)
Pt continues to pull at monitor cords. Pt has left oxygen alone. Pt encouraged to go back to sleep.

## 2019-11-17 NOTE — Progress Notes (Signed)
Physical Therapy Evaluation Patient Details Name: Maria Bush MRN: [id number] DOB: [birth year] Today's Date: 11/17/2019   History of Present Illness  Maria Bush  is a 86 y.o. female with a known history of dementia and diverticulosis, who presented to the emergency room with acute onset of generalized weakness.  Her son for the last couple of days with significant anorexia.  Clinical Impression  Patient has dementia and follows only partial verbal cues. She performs bed mobility with mod assist for supine <> sit . She has fair static sitting balance. She is unable to stand or ambulate today. She has 3/5 strength to BLE hip flex and knee extension. She will continue to benefit from skilled PT to improve mobility and strength.     Follow Up Recommendations SNF    Equipment Recommendations  Rolling walker with 5" wheels    Recommendations for Other Services       Precautions / Restrictions Precautions Precautions: Fall Restrictions Weight Bearing Restrictions: No      Mobility  Bed Mobility Overal bed mobility: Needs Assistance Bed Mobility: Supine to Sit;Sit to Supine     Supine to sit: Mod assist Sit to supine: Mod assist   General bed mobility comments: difficulty following cues  Transfers Overall transfer level: (unable)                  Ambulation/Gait Ambulation/Gait assistance: (unable)              Stairs            Wheelchair Mobility    Modified Rankin (Stroke Patients Only)       Balance Overall balance assessment: Mild deficits observed, not formally tested                                           Pertinent Vitals/Pain      Home Living Family/patient expects to be discharged to:: Private residence Living Arrangements: Children               Additional Comments: (unable to determine home access due to dementia)    Prior Function Level of Independence: (unknown)               Hand  Dominance        Extremity/Trunk Assessment        Lower Extremity Assessment Lower Extremity Assessment: Generalized weakness(-3/5 strength BLE hip flex, knee ext)       Communication   Communication: Other (comment)(pateint has dementia)  Cognition Arousal/Alertness: Awake/alert Behavior During Therapy: Flat affect Overall Cognitive Status: History of cognitive impairments - at baseline                                        General Comments      Exercises     Assessment/Plan    PT Assessment Patient needs continued PT services  PT Problem List Decreased strength;Decreased activity tolerance;Decreased balance;Decreased mobility       PT Treatment Interventions Functional mobility training;Therapeutic activities;Therapeutic exercise    PT Goals (Current goals can be found in the Care Plan section)  Acute Rehab PT Goals Patient Stated Goal: no goals stated PT Goal Formulation: Patient unable to participate in goal setting Time For Goal Achievement: 12/01/19 Potential to Achieve Goals: Fair  Frequency Min 2X/week   Barriers to discharge        Co-evaluation               AM-PAC PT "6 Clicks" Mobility  Outcome Measure Help needed turning from your back to your side while in a flat bed without using bedrails?: A Lot Help needed moving from lying on your back to sitting on the side of a flat bed without using bedrails?: A Lot Help needed moving to and from a bed to a chair (including a wheelchair)?: Total Help needed standing up from a chair using your arms (e.g., wheelchair or bedside chair)?: Total Help needed to walk in hospital room?: Total Help needed climbing 3-5 steps with a railing? : Total 6 Click Score: 8    End of Session Equipment Utilized During Treatment: Gait belt Activity Tolerance: Patient tolerated treatment well Patient left: in bed;with bed alarm set Nurse Communication: Mobility status PT Visit Diagnosis:  Unsteadiness on feet (R26.81);Muscle weakness (generalized) (M62.81);Difficulty in walking, not elsewhere classified (R26.2)    Time: 1330-1350 PT Time Calculation (min) (ACUTE ONLY): 20 min   Charges:   PT Evaluation $PT Eval Low Complexity: 1 Low PT Treatments $Therapeutic Activity: 8-22 mins          Alanson Puls, PT DPT 11/17/2019, 2:31 PM

## 2019-11-18 DIAGNOSIS — N170 Acute kidney failure with tubular necrosis: Secondary | ICD-10-CM

## 2019-11-18 DIAGNOSIS — R627 Adult failure to thrive: Secondary | ICD-10-CM

## 2019-11-18 LAB — TROPONIN I (HIGH SENSITIVITY): Troponin I (High Sensitivity): 77 ng/L — ABNORMAL HIGH (ref <18)

## 2019-11-18 LAB — ECHOCARDIOGRAM COMPLETE
Height: 62 in
Weight: 1920 oz

## 2019-11-18 LAB — BASIC METABOLIC PANEL
Anion gap: 14 (ref 5–15)
BUN: 46 mg/dL — ABNORMAL HIGH (ref 8–23)
CO2: 20 mmol/L — ABNORMAL LOW (ref 22–32)
Calcium: 8.6 mg/dL — ABNORMAL LOW (ref 8.9–10.3)
Chloride: 107 mmol/L (ref 98–111)
Creatinine, Ser: 0.97 mg/dL (ref 0.44–1.00)
GFR calc Af Amer: >60 mL/min (ref >60)
GFR calc non Af Amer: 53 mL/min — ABNORMAL LOW (ref >60)
Glucose, Bld: 76 mg/dL (ref 70–99)
Potassium: 3.5 mmol/L (ref 3.5–5.1)
Sodium: 141 mmol/L (ref 135–145)

## 2019-11-18 MED ORDER — MIRTAZAPINE 30 MG PO TABS: 30.0000 mg | ORAL_TABLET | Freq: Every day | ORAL | 0 refills | Status: DC

## 2019-11-18 NOTE — TOC Initial Note (Signed)
Transition of Care [facility]) - Initial/Assessment Note    Patient Details  Name: Maria Bush MRN: [id number] Date of Birth: 1930-01-15  Transition of Care [facility] CM/SW Contact:    Darleene Cleaver, LCSW Phone Number: 11/18/2019, 7:43 PM  Clinical Narrative:                 Patient is an 86 year old female who is alert and oriented x2.  Patient has some dementia, and patient's son is the main contact.  CSW spoke to patient's son and originally patient was going to go home with home health, however son and daughter now want SNF for patient.  CSW explained how insurance will pay for stay, and what the process is for getting a SNF for patient.  CSW was given permission to begin bed search in [place] and [place].  Expected Discharge Plan: Skilled Nursing Facility Barriers to Discharge: Continued Medical Work up   Patient Goals and CMS Choice Patient states their goals for this hospitalization and ongoing recovery are:: To go to SNF the return back home with son. CMS Medicare.gov Compare Post Acute Care list provided to:: Patient Represenative (must comment) Choice offered to / list presented to : Adult Children  Expected Discharge Plan and Services Expected Discharge Plan: Skilled Nursing Facility     Post Acute Care Choice: Skilled Nursing Facility Living arrangements for the past 2 months: Single Family Home Expected Discharge Date: 11/18/19               DME Arranged: Bedside commode DME Agency: [employer] Date DME Agency Contacted: 11/18/19 Time DME Agency Contacted: 1600 Representative spoke with at DME Agency: Brad            Prior Living Arrangements/Services Living arrangements for the past 2 months: Single Family Home Lives with:: Adult Children Patient language and need for interpreter reviewed:: Yes Do you feel safe going back to the place where you live?: No   Patient's son now wants her to go to SNF for short term rehab.  Need for Family Participation  in Patient Care: Yes (Comment) Care giver support system in place?: No (comment)   Criminal Activity/Legal Involvement Pertinent to Current Situation/Hospitalization: No - Comment as needed  Activities of Daily Living Home Assistive Devices/Equipment: Walker (specify type) ADL Screening (condition at time of admission) Patient's cognitive ability adequate to safely complete daily activities?: No Is the patient deaf or have difficulty hearing?: No Does the patient have difficulty seeing, even when wearing glasses/contacts?: No Does the patient have difficulty concentrating, remembering, or making decisions?: Yes Patient able to express need for assistance with ADLs?: Yes Does the patient have difficulty dressing or bathing?: Yes Independently performs ADLs?: No Communication: Independent, Appropriate for developmental age(dementia) Is this a change from baseline?: Pre-admission baseline Dressing (OT): Needs assistance Is this a change from baseline?: Pre-admission baseline Grooming: Needs assistance Is this a change from baseline?: Pre-admission baseline Feeding: Needs assistance Is this a change from baseline?: Pre-admission baseline Bathing: Needs assistance Is this a change from baseline?: Pre-admission baseline Toileting: Needs assistance Is this a change from baseline?: Pre-admission baseline In/Out Bed: Needs assistance Is this a change from baseline?: Pre-admission baseline Walks in Home: Needs assistance Is this a change from baseline?: Pre-admission baseline Does the patient have difficulty walking or climbing stairs?: Yes Weakness of Legs: Both Weakness of Arms/Hands: Both  Permission Sought/Granted Permission sought to share information with : Family Supports, Magazine features editor Permission granted to share information with :  Yes, Verbal Permission Granted  Share Information with NAME: Maybel, Dambrosio   [phone] Angelmarie, Ponzo Daughter    [phone]  Permission granted to share info w AGENCY: SNF admissions        Emotional Assessment Appearance:: Appears stated age   Affect (typically observed): Accepting, Appropriate Orientation: : Oriented to Self, Oriented to Place Alcohol / Substance Use: Not Applicable Psych Involvement: No (comment)  Admission diagnosis:  Cough [R05] Elevated troponin [R77.8] Generalized weakness [R53.1] Acute renal failure (ARF) ([place]) [N17.9] Patient Active Problem List   Diagnosis Date Noted  . Failure to thrive in adult   . Elevated troponin 11/17/2019  . Acute renal failure (ARF) ([place]) 11/17/2019  . Generalized weakness   . Dementia without behavioral disturbance ([place]) 09/24/2019  . GIB (gastrointestinal bleeding) 03/30/2017  . Gastroesophageal reflux disease with esophagitis   . Acute post-hemorrhagic anemia 03/26/2017  . Bradycardia 03/26/2017  . Acute blood loss anemia 03/26/2017  . Diverticulosis   . GI bleed 03/25/2017  . Left shoulder pain 12/26/2015  . Edema 04/25/2015  . Shortness of breath 04/25/2015  . Unsteady gait 04/25/2015  . Allergic rhinitis 04/24/2015  . Arthropathia 04/24/2015  . B12 deficiency 04/24/2015  . Atherosclerosis of coronary artery 04/24/2015  . DDD (degenerative disc disease), lumbar 04/24/2015  . Clinical depression 04/24/2015  . DD (diverticular disease) 04/24/2015  . Elevated CK 04/24/2015  . Atrial premature depolarization 04/24/2015  . Hypercholesteremia 04/24/2015  . BP (high blood pressure) 04/24/2015  . Anemia, iron deficiency 04/24/2015  . 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 Lower esophageal ring 07/12/2014  . 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 Urge incontinence of urine 09/01/2013  . Incomplete emptying of bladder 09/01/2013  . Other symptoms involving urinary system(788.99) 09/01/2013  . Family history of malignant neoplasm of breast 02/10/2013  . Diffuse cystic mastopathy  02/10/2013  . Arthritis    PCP:  Birdie Sons, MD Pharmacy:   [facility] [phone] [address], [place] [address] Phone: [phone] Fax: [phone]     Social Determinants of Health (SDOH) Interventions    Readmission Risk Interventions No flowsheet data found.

## 2019-11-18 NOTE — Discharge Summary (Signed)
[facility] [place] at [facility]   PATIENT NAME: Maria Bush    MR#:  [id number]  DATE OF BIRTH:  1930-06-18  DATE OF ADMISSION:  11/16/2019 ADMITTING PHYSICIAN: Enedina Finner, MD  DATE OF DISCHARGE: 11/18/2019  PRIMARY CARE PHYSICIAN: Malva Limes, MD    ADMISSION DIAGNOSIS:  Cough [R05] Elevated troponin [R77.8] Generalized weakness [R53.1] Acute renal failure (ARF) (HCC) [N17.9]  DISCHARGE DIAGNOSIS:  acute renal failure-- resolved failure to thrive/poor PO intake/advanced dementia  SECONDARY DIAGNOSIS:   Past Medical History:  Diagnosis Date  . Acute blood loss anemia   . Arthritis 2005  . Diverticulosis   . GI bleed   . Heart murmur 2005  . Hypertension   . Memory loss     HOSPITAL COURSE:   Maria Bush a86 y.o.femalewith a known history of dementiaanddiverticulosis, who presented to the emergency room with acute onset of generalized weakness. Her son rpeorted taht  for the last couple of days with significant anorexia. She did not have any reported nausea or vomiting or abdominal pain  1. Acute renal failure with Generalized weakness. -appears prerenal azotemia poor PO intake -recieved IV fluids -came in with creatinine of 1.07--1.47--0.97 -avoid nephrotoxic agents  2. Elevated troponin I.  -patient denies any chest pain. She has significant dementia. -Seen by cardiology continue conservative management - will continue aspirin  -pt will f/u dr Maria Bush as needed  3. Advance Dementia, failure to thrive with poor PO intake -discussed with patient's son who agrees with patient having poor appetite for last few weeks. Will continue to encourage to feed patient. -Received IV fluids. -Discussed with patient's son regarding discharge planning to rehab if he wants to consider. Son wants her take patient home. Will arrange home health physical therapy and aide. Discussed with social worker today. -Son does understand  patient has advanced dementia and make continue to get worse.  4.Generalized weakness -seen by physical therapy recommends rehab. Son Maria Bush sponsor take patient home. Home health PT and aide will be arranged  5. Hypertension. -patient's lisinopril and atenolol resumed now -lasix, chlorthalidone and amlodipine d/ced  6. GERD. -continue PPI therapy.  7. DVT prophylaxis. -Subcutaneous Lovenox.  Procedures: none Family communication : discussed with son Maria Bush on the phone Consults : Discharge Disposition : home with [facility] CODE STATUS: full code per son  D/c home today CONSULTS OBTAINED:  Treatment Team:  Marcina Millard, MD  DRUG ALLERGIES:   Allergies  Allergen Reactions  . Cephalexin Shortness Of Breath  . Aricept [Donepezil]     Hallucinations   . Prednisone Nausea And Vomiting  . Sulfa Antibiotics Itching  . Penicillins Rash    DISCHARGE MEDICATIONS:   Allergies as of 11/18/2019      Reactions   Cephalexin Shortness Of Breath   Aricept [donepezil]    Hallucinations   Prednisone Nausea And Vomiting   Sulfa Antibiotics Itching   Penicillins Rash      Medication List    STOP taking these medications   amLODipine 5 MG tablet Commonly known as: NORVASC   Calcium Carbonate-Vitamin D 600-200 MG-UNIT Tabs   chlorthalidone 25 MG tablet Commonly known as: HYGROTON   furosemide 20 MG tablet Commonly known as: LASIX   mometasone 0.1 % cream Commonly known as: ELOCON   nitrofurantoin 100 MG capsule Commonly known as: Macrodantin   potassium chloride 10 MEQ tablet Commonly known as: KLOR-CON   traMADol 50 MG tablet Commonly known as: ULTRAM     TAKE these  medications   aspirin 81 MG EC tablet Take 81 mg by mouth daily.   atenolol 25 MG tablet Commonly known as: TENORMIN TAKE 1 TABLET BY MOUTH DAILY   Calcium Carbonate-Vitamin D3 600-400 MG-UNIT Tabs Take 1 tablet by mouth daily.   FeroSul 325 (65 FE) MG  tablet Generic drug: ferrous sulfate TAKE 1 TABLET(325 MG) BY MOUTH DAILY   latanoprost 0.005 % ophthalmic solution Commonly known as: XALATAN Place 1 drop into both eyes at bedtime.   lisinopril 20 MG tablet Commonly known as: ZESTRIL Take 1 tablet (20 mg total) by mouth daily.   loratadine 10 MG tablet Commonly known as: CLARITIN Take by mouth daily as needed.   mirabegron ER 50 MG Tb24 tablet Commonly known as: MYRBETRIQ Take 1 tablet (50 mg total) by mouth daily.   mirtazapine 30 MG tablet Commonly known as: REMERON Take 1 tablet (30 mg total) by mouth at bedtime.   omeprazole 40 MG capsule Commonly known as: PRILOSEC TAKE ONE CAPSULE BY MOUTH DAILY What changed: how much to take   Travatan Z 0.004 % Soln ophthalmic solution Generic drug: Travoprost (BAK Free) Place 1 drop into both eyes at bedtime.       If you experience worsening of your admission symptoms, develop shortness of breath, life threatening emergency, suicidal or homicidal thoughts you must seek medical attention immediately by calling 911 or calling your MD immediately  if symptoms less severe.  You Must read complete instructions/literature along with all the possible adverse reactions/side effects for all the Medicines you take and that have been prescribed to you. Take any new Medicines after you have completely understood and accept all the possible adverse reactions/side effects.   Please note  You were cared for by a hospitalist during your hospital stay. If you have any questions about your discharge medications or the care you received while you were in the hospital after you are discharged, you can call the unit and asked to speak with the hospitalist on call if the hospitalist that took care of you is not available. Once you are discharged, your primary care physician will handle any further medical issues. Please note that NO REFILLS for any discharge medications will be authorized once you are  discharged, as it is imperative that you return to your primary care physician (or establish a relationship with a primary care physician if you do not have one) for your aftercare needs so that they can reassess your need for medications and monitor your lab values. Today   SUBJECTIVE   Confused at baseline although opens eyes drinks water when fed. No new issues per RN  VITAL SIGNS:  Blood pressure (!) 147/79, pulse 73, temperature 97.9 F (36.6 C), resp. rate 18, height 5\' 2"  (1.575 m), weight 51.6 kg, SpO2 96 %.  I/O:    Intake/Output Summary (Last 24 hours) at 11/18/2019 1200 Last data filed at 11/18/2019 1133 Gross per 24 hour  Intake 1898.33 ml  Output 300 ml  Net 1598.33 ml    PHYSICAL EXAMINATION:  GENERAL:  86 y.o.-year-old patient lying in the bed with no acute distress. Thin EYES: Pupils equal, round, reactive to light and accommodation. No scleral icterus. Extraocular muscles intact.  HEENT: Head atraumatic, normocephalic. Oropharynx and nasopharynx clear. Dry oral mucosa NECK:  Supple, no jugular venous distention. No thyroid enlargement, no tenderness.  LUNGS: Normal breath sounds bilaterally, no wheezing, rales,rhonchi or crepitation. No use of accessory muscles of respiration.  CARDIOVASCULAR: S1, S2 normal. No  murmurs, rubs, or gallops.  ABDOMEN: Soft, non-tender, non-distended. Bowel sounds present. No organomegaly or mass.  EXTREMITIES: No pedal edema, cyanosis, or clubbing.  NEUROLOGIC:no  focal weakness.  PSYCHIATRIC:  patient is alert and awake. Confused at baseline  SKIN: No obvious rash, lesion, or ulcer.   DATA REVIEW:   CBC  Recent Labs  Lab 11/17/19 0521  WBC 13.3*  HGB 12.2  HCT 35.3*  PLT 276    Chemistries  Recent Labs  Lab 11/17/19 0521 11/18/19 0517  NA 142 141  K 3.9 3.5  CL 103 107  CO2 26 20*  GLUCOSE 114* 76  BUN 56* 46*  CREATININE 1.47* 0.97  CALCIUM 8.6* 8.6*  AST 30  --   ALT 21  --   ALKPHOS 52  --   BILITOT 1.8*  --      Microbiology Results   Recent Results (from the past 240 hour(s))  SARS CORONAVIRUS 2 (TAT 6-24 HRS) Nasopharyngeal Nasopharyngeal Swab     Status: None   Collection Time: 11/17/19 12:52 AM   Specimen: Nasopharyngeal Swab  Result Value Ref Range Status   SARS Coronavirus 2 NEGATIVE NEGATIVE Final    Comment: (NOTE) SARS-CoV-2 target nucleic acids are NOT DETECTED. The SARS-CoV-2 RNA is generally detectable in upper and lower respiratory specimens during the acute phase of infection. Negative results do not preclude SARS-CoV-2 infection, do not rule out co-infections with other pathogens, and should not be used as the sole basis for treatment or other patient management decisions. Negative results must be combined with clinical observations, patient history, and epidemiological information. The expected result is Negative. Fact Sheet for Patients: HairSlick.no Fact Sheet for Healthcare Providers: quierodirigir.com This test is not yet approved or cleared by the [place] FDA and  has been authorized for detection and/or diagnosis of SARS-CoV-2 by FDA under an Emergency Use Authorization (EUA). This EUA will remain  in effect (meaning this test can be used) for the duration of the COVID-19 declaration under Section 56 4(b)(1) of the Act, 21 U.S.C. section 360bbb-3(b)(1), unless the authorization is terminated or revoked sooner. Performed at [facility], [address], [place], [place] [address]     RADIOLOGY:  DG Chest 1 View  Result Date: 11/17/2019 CLINICAL DATA:  Cough and weakness. EXAM: CHEST  1 VIEW COMPARISON:  08/06/2018 FINDINGS: Borderline cardiomegaly, unchanged. Unchanged mediastinal contours. No pulmonary edema or focal airspace disease. No pneumothorax or large pleural effusion. No acute osseous abnormalities are seen. Chronic changes about the left shoulder. IMPRESSION: Stable borderline  cardiomegaly. No acute chest findings. Electronically Signed   By: Narda Rutherford M.D.   On: 11/17/2019 05:19   CT Head Wo Contrast  Result Date: 11/16/2019 CLINICAL DATA:  Head trauma, minor (Age >= 65y) Dementia patient with weakness and decreased appetite, decreased activity. EXAM: CT HEAD WITHOUT CONTRAST TECHNIQUE: Contiguous axial images were obtained from the base of the skull through the vertex without intravenous contrast. COMPARISON:  Remote exam 01/02/2010 FINDINGS: Brain: No intracranial hemorrhage, mass effect, or midline shift. Generalized atrophy. No hydrocephalus. The basilar cisterns are patent. Moderate periventricular and deep white matter hypodensity most consistent with chronic small vessel ischemia. No evidence of territorial infarct or acute ischemia. No extra-axial or intracranial fluid collection. Vascular: Atherosclerosis of skullbase vasculature without hyperdense vessel or abnormal calcification. Skull: No fracture or focal lesion. Sinuses/Orbits: No acute findings. Bilateral cataract resection. Other: None. IMPRESSION: 1. No acute intracranial abnormality. No skull fracture. 2. Generalized atrophy and chronic  small vessel ischemia. Electronically Signed   By: Keith Rake M.D.   On: 11/16/2019 22:26   ECHOCARDIOGRAM COMPLETE  Result Date: 11/18/2019   ECHOCARDIOGRAM REPORT   Patient Name:   Maria Bush Date of Exam: 11/17/2019 Medical Rec #:  [id number]         Height:       62.0 in Accession #:    [id number]        Weight:       113.7 lb Date of Birth:  [birth year]         BSA:          1.50 m Patient Age:    85 years          BP:           107/67 mmHg Patient Gender: F                 HR:           49 bpm. Exam Location:  [facility] Procedure: 2D Echo, Color Doppler and Cardiac Doppler Indications:     Elevated troponin  History:         Patient has no prior history of Echocardiogram examinations.                  Signs/Symptoms:Murmur; Risk Factors:Hypertension.   Sonographer:     Charmayne Sheer RDCS (AE) Referring Phys:  [place] Diagnosing Phys: Isaias Cowman MD  Sonographer Comments: Technically difficult study due to poor echo windows. Pt unable to move left arm for optimal apical images. IMPRESSIONS  1. Left ventricular ejection fraction, by visual estimation, is 60 to 65%. The left ventricle has normal function. There is no left ventricular hypertrophy.  2. The left ventricle has no regional wall motion abnormalities.  3. Global right ventricle has normal systolic function.The right ventricular size is normal. No increase in right ventricular wall thickness.  4. Left atrial size was mildly dilated.  5. Right atrial size was normal.  6. The mitral valve is normal in structure. Mild mitral valve regurgitation. No evidence of mitral stenosis.  7. The tricuspid valve is normal in structure.  8. Aortic valve regurgitation is mild to moderate.  9. The aortic valve is normal in structure. Aortic valve regurgitation is mild to moderate. No evidence of aortic valve sclerosis or stenosis. 10. The pulmonic valve was normal in structure. Pulmonic valve regurgitation is not visualized. 11. Normal pulmonary artery systolic pressure. 12. The inferior vena cava is normal in size with greater than 50% respiratory variability, suggesting right atrial pressure of 3 mmHg. FINDINGS  Left Ventricle: Left ventricular ejection fraction, by visual estimation, is 60 to 65%. The left ventricle has normal function. The left ventricle has no regional wall motion abnormalities. There is no left ventricular hypertrophy. Normal left atrial pressure. Right Ventricle: The right ventricular size is normal. No increase in right ventricular wall thickness. Global RV systolic function is has normal systolic function. The tricuspid regurgitant velocity is 2.08 m/s, and with an assumed right atrial pressure  of 10 mmHg, the estimated right ventricular systolic pressure is normal at 27.3  mmHg. Left Atrium: Left atrial size was mildly dilated. Right Atrium: Right atrial size was normal in size Pericardium: There is no evidence of pericardial effusion. Mitral Valve: The mitral valve is normal in structure. Mild mitral valve regurgitation. No evidence of mitral valve stenosis by observation. MV peak gradient, 1.6 mmHg. Tricuspid Valve: The tricuspid valve is normal in  structure. Tricuspid valve regurgitation is mild. Aortic Valve: The aortic valve is normal in structure. Aortic valve regurgitation is mild to moderate. Aortic regurgitation PHT measures 886 msec. The aortic valve is structurally normal, with no evidence of sclerosis or stenosis. Aortic valve mean gradient measures 2.0 mmHg. Aortic valve peak gradient measures 5.5 mmHg. Aortic valve area, by VTI measures 2.88 cm. Pulmonic Valve: The pulmonic valve was normal in structure. Pulmonic valve regurgitation is not visualized. Pulmonic regurgitation is not visualized. Aorta: The aortic root, ascending aorta and aortic arch are all structurally normal, with no evidence of dilitation or obstruction. Venous: The inferior vena cava is normal in size with greater than 50% respiratory variability, suggesting right atrial pressure of 3 mmHg. IAS/Shunts: No atrial level shunt detected by color flow Doppler. There is no evidence of a patent foramen ovale. No ventricular septal defect is seen or detected. There is no evidence of an atrial septal defect.  LEFT VENTRICLE PLAX 2D LVIDd:         4.19 cm  Diastology LVIDs:         2.45 cm  LV e' lateral:   8.05 cm/s LV PW:         0.95 cm  LV E/e' lateral: 6.7 LV IVS:        1.01 cm  LV e' medial:    4.13 cm/s LVOT diam:     2.30 cm  LV E/e' medial:  13.1 LV SV:         57 ml LV SV Index:   37.87 LVOT Area:     4.15 cm  LEFT ATRIUM             Index LA diam:        4.10 cm 2.73 cm/m LA Vol (A2C):   32.6 ml 21.68 ml/m LA Vol (A4C):   61.8 ml 41.10 ml/m LA Biplane Vol: 49.3 ml 32.78 ml/m  AORTIC VALVE                    PULMONIC VALVE AV Area (Vmax):    2.97 cm    PV Vmax:       0.79 m/s AV Area (Vmean):   3.12 cm    PV Vmean:      54.200 cm/s AV Area (VTI):     2.88 cm    PV VTI:        0.185 m AV Vmax:           117.00 cm/s PV Peak grad:  2.5 mmHg AV Vmean:          73.600 cm/s PV Mean grad:  1.0 mmHg AV VTI:            0.218 m AV Peak Grad:      5.5 mmHg AV Mean Grad:      2.0 mmHg LVOT Vmax:         83.50 cm/s LVOT Vmean:        55.200 cm/s LVOT VTI:          0.151 m LVOT/AV VTI ratio: 0.69 AI PHT:            886 msec  AORTA Ao Root diam: 3.60 cm MITRAL VALVE                        TRICUSPID VALVE MV Area (PHT): 4.80 cm             TR Peak grad:  17.3 mmHg MV Peak grad:  1.6 mmHg             TR Vmax:        208.00 cm/s MV Mean grad:  0.0 mmHg MV Vmax:       0.64 m/s             SHUNTS MV Vmean:      32.1 cm/s            Systemic VTI:  0.15 m MV VTI:        0.23 m               Systemic Diam: 2.30 cm MV PHT:        45.82 msec MV Decel Time: 158 msec MV E velocity: 54.30 cm/s 103 cm/s MV A velocity: 78.60 cm/s 70.3 cm/s MV E/A ratio:  0.69       1.5  Marcina MillardAlexander Paraschos MD Electronically signed by Marcina MillardAlexander Paraschos MD Signature Date/Time: 11/18/2019/9:36:59 AM    Final      CODE STATUS:     Code Status Orders  (From admission, onward)         Start     Ordered   11/17/19 0049  Full code  Continuous     11/17/19 0056        Code Status History    Date Active Date Inactive Code Status Order ID Comments User Context   03/26/2017 1416 03/29/2017 2148 Full Code [id number]  Gwenyth BenderBlack, Karen M, NP Inpatient   03/25/2017 1856 03/26/2017 1405 Full Code [id number]  [place] BilberryPatel, Shreyang, MD Inpatient   Advance Care Planning Activity    Advance Directive Documentation     Most Recent Value  Type of Advance Directive  Healthcare Power of Attorney, Living will  Pre-existing out of facility DNR order (yellow form or pink MOST form)  --  "MOST" Form in Place?  --       TOTAL TIME TAKING CARE OF THIS PATIENT:  *40* minutes.    Enedina FinnerSona Arin Vanosdol M.D on 11/18/2019 at 12:00 PM  Between 7am to 6pm - Pager - [phone] After 6pm go to [email or web address] - password [email or web address]  [facility]    CC: Primary care physician; Malva LimesFisher, Donald E, MD

## 2019-11-18 NOTE — Progress Notes (Signed)
Patient has remained confused for the shift. Pt will eat small bites if Fed. Pt drinking well when offered fluids. Pt ready for D/c Home with Son.

## 2019-11-18 NOTE — NC FL2 (Signed)
[place] MEDICAID FL2 LEVEL OF CARE SCREENING TOOL     IDENTIFICATION  Patient Name: Maria Bush Birthdate: [birth year] Sex: female Admission Date (Current Location): 11/16/2019  [place] and [place] Number:  Chiropodist and Address:  [facility], [address], [place], [place] [address]      Provider Number: [id number]  Attending Physician Name and Address:  Enedina Finner, MD  Relative Name and Phone Number:  Birttany, Dechellis   [phone]    Current Level of Care: Hospital Recommended Level of Care: Skilled Nursing Facility Prior Approval Number:    Date Approved/Denied:   PASRR Number: [id number]  Discharge Plan: SNF    Current Diagnoses: Patient Active Problem List   Diagnosis Date Noted  . Failure to thrive in adult   . Elevated troponin 11/17/2019  . Acute renal failure (ARF) (HCC) 11/17/2019  . Generalized weakness   . Dementia without behavioral disturbance (HCC) 09/24/2019  . GIB (gastrointestinal bleeding) 03/30/2017  . Gastroesophageal reflux disease with esophagitis   . Acute post-hemorrhagic anemia 03/26/2017  . Bradycardia 03/26/2017  . Acute blood loss anemia 03/26/2017  . Diverticulosis   . GI bleed 03/25/2017  . Left shoulder pain 12/26/2015  . Edema 04/25/2015  . Shortness of breath 04/25/2015  . Unsteady gait 04/25/2015  . Allergic rhinitis 04/24/2015  . Arthropathia 04/24/2015  . B12 deficiency 04/24/2015  . Atherosclerosis of coronary artery 04/24/2015  . DDD (degenerative disc disease), lumbar 04/24/2015  . Clinical depression 04/24/2015  . DD (diverticular disease) 04/24/2015  . Elevated CK 04/24/2015  . Atrial premature depolarization 04/24/2015  . Hypercholesteremia 04/24/2015  . BP (high blood pressure) 04/24/2015  . Anemia, iron deficiency 04/24/2015  . 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 Lower esophageal ring 07/12/2014  . 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 Urge incontinence of urine 09/01/2013  . Incomplete emptying of bladder 09/01/2013  . Other symptoms involving urinary system(788.99) 09/01/2013  . Family history of malignant neoplasm of breast 02/10/2013  . Diffuse cystic mastopathy 02/10/2013  . Arthritis     Orientation RESPIRATION BLADDER Height & Weight     Self, Place  Normal Incontinent Weight: 113 lb 11.2 oz (51.6 kg) Height:  5\' 2"  (157.5 cm)  BEHAVIORAL SYMPTOMS/MOOD NEUROLOGICAL BOWEL NUTRITION STATUS      Continent Diet(Cardiac)  AMBULATORY STATUS COMMUNICATION OF NEEDS Skin   Limited Assist Verbally Normal                       Personal Care Assistance Level of Assistance  Bathing, Dressing, Feeding Bathing Assistance: Limited assistance Feeding assistance: Limited assistance Dressing Assistance: Limited assistance     Functional Limitations Info  Sight, Speech, Hearing Sight Info: Adequate Hearing Info: Adequate Speech Info: Adequate    SPECIAL CARE FACTORS FREQUENCY  PT (By licensed PT), OT (By licensed OT)     PT Frequency: min 5x a week OT Frequency: minimum 5x a week            Contractures Contractures Info: Not present    Additional Factors Info  Code Status, Allergies Code Status Info: Full Code Allergies Info: Cephalexin Aricept Prednisone Sulfa Antibiotics Penicillins           Current Medications (11/18/2019):  This is the current hospital active medication list Current Facility-Administered Medications  Medication Dose Route Frequency Provider Last Rate Last Admin  . acetaminophen (TYLENOL) tablet 650 mg  650 mg Oral Q4H PRN Mansy,  Jan A, MD      . aspirin EC tablet 81 mg  81 mg Oral Daily Mansy, Jan A, MD   81 mg at 11/18/19 1058  . calcium-vitamin D (OSCAL WITH D) 500-200 MG-UNIT per tablet 1 tablet  1 tablet Oral Daily Mansy, Jan A, MD   1 tablet at 11/18/19 1058  . enoxaparin (LOVENOX) injection 30 mg  30 mg Subcutaneous Q24H Fritzi Mandes, MD    30 mg at 11/18/19 1058  . ferrous sulfate tablet 325 mg  325 mg Oral Q breakfast Mansy, Jan A, MD   325 mg at 11/18/19 1058  . guaiFENesin (MUCINEX) 12 hr tablet 600 mg  600 mg Oral BID Mansy, Jan A, MD   600 mg at 11/18/19 1058  . latanoprost (XALATAN) 0.005 % ophthalmic solution 1 drop  1 drop Both Eyes QHS Fritzi Mandes, MD   1 drop at 11/17/19 2100  . loratadine (CLARITIN) tablet 10 mg  10 mg Oral Daily Mansy, Jan A, MD   10 mg at 11/18/19 1058  . MEDLINE mouth rinse  15 mL Mouth Rinse BID Fritzi Mandes, MD   15 mL at 11/17/19 1258  . mirabegron ER (MYRBETRIQ) tablet 25 mg  25 mg Oral Daily Fritzi Mandes, MD   25 mg at 11/17/19 1258  . mirtazapine (REMERON) tablet 30 mg  30 mg Oral QHS Fritzi Mandes, MD   30 mg at 11/17/19 2100  . ondansetron (ZOFRAN) injection 4 mg  4 mg Intravenous Q6H PRN Mansy, Jan A, MD      . pantoprazole (PROTONIX) EC tablet 40 mg  40 mg Oral Daily Mansy, Jan A, MD   40 mg at 11/18/19 1058  . traMADol (ULTRAM) tablet 50 mg  50 mg Oral Q12H PRN Mansy, Arvella Merles, MD         Discharge Medications: Please see discharge summary for a list of discharge medications.  Relevant Imaging Results:  Relevant Lab Results:   Additional Information SSN [id number]  Ross Ludwig, LCSW

## 2019-11-18 NOTE — Discharge Instructions (Signed)
Your Lasix, chlorthalidone and amlodipine has been held.

## 2019-11-18 NOTE — Progress Notes (Signed)
[facility] Cardiology  SUBJECTIVE: Patient laying in bed, eyes closed, answering questions   Vitals:   11/17/19 1655 11/17/19 1933 11/18/19 0437 11/18/19 0749  BP: 109/61 (!) 104/57 140/72 (!) 147/79  Pulse: 70 70 68 73  Resp: 14 20 20 18   Temp: 97.9 F (36.6 C) 98.4 F (36.9 C) 98.3 F (36.8 C) 97.9 F (36.6 C)  TempSrc: Oral Oral Oral   SpO2: 100% 100% 95% 96%  Weight:      Height:         Intake/Output Summary (Last 24 hours) at 11/18/2019 01/16/2020 Last data filed at 11/18/2019 0500 Gross per 24 hour  Intake 1798.33 ml  Output 300 ml  Net 1498.33 ml      PHYSICAL EXAM  General: Chronically ill-appearing, cachectic HEENT:  Normocephalic and atramatic Neck:  No JVD.  Lungs: Clear bilaterally to auscultation and percussion. Heart: HRRR . Normal S1 and S2 without gallops or murmurs.  Abdomen: Bowel sounds are positive, abdomen soft and non-tender  Msk:  Back normal, normal gait. Normal strength and tone for age. Extremities: No clubbing, cyanosis or edema.   Neuro: Alert and oriented X 1. Psych: Significant dementia  LABS: Basic Metabolic Panel: Recent Labs    11/17/19 0521 11/18/19 0517  NA 142 141  K 3.9 3.5  CL 103 107  CO2 26 20*  GLUCOSE 114* 76  BUN 56* 46*  CREATININE 1.47* 0.97  CALCIUM 8.6* 8.6*   Liver Function Tests: Recent Labs    11/17/19 0521  AST 30  ALT 21  ALKPHOS 52  BILITOT 1.8*  PROT 7.4  ALBUMIN 3.4*   No results for input(s): LIPASE, AMYLASE in the last 72 hours. CBC: Recent Labs    11/16/19 1559 11/17/19 0521  WBC 14.7* 13.3*  HGB 12.8 12.2  HCT 38.7 35.3*  MCV 100.8* 97.5  PLT 298 276   Cardiac Enzymes: No results for input(s): CKTOTAL, CKMB, CKMBINDEX, TROPONINI in the last 72 hours. BNP: Invalid input(s): POCBNP D-Dimer: No results for input(s): DDIMER in the last 72 hours. Hemoglobin A1C: No results for input(s): HGBA1C in the last 72 hours. Fasting Lipid Panel: Recent Labs    11/17/19 0521  CHOL 149  HDL 31*   LDLCALC 102*  TRIG 81  CHOLHDL 4.8   Thyroid Function Tests: No results for input(s): TSH, T4TOTAL, T3FREE, THYROIDAB in the last 72 hours.  Invalid input(s): FREET3 Anemia Panel: No results for input(s): VITAMINB12, FOLATE, FERRITIN, TIBC, IRON, RETICCTPCT in the last 72 hours.  DG Chest 1 View  Result Date: 11/17/2019 CLINICAL DATA:  Cough and weakness. EXAM: CHEST  1 VIEW COMPARISON:  08/06/2018 FINDINGS: Borderline cardiomegaly, unchanged. Unchanged mediastinal contours. No pulmonary edema or focal airspace disease. No pneumothorax or large pleural effusion. No acute osseous abnormalities are seen. Chronic changes about the left shoulder. IMPRESSION: Stable borderline cardiomegaly. No acute chest findings. Electronically Signed   By: 08/08/2018 M.D.   On: 11/17/2019 05:19   CT Head Wo Contrast  Result Date: 11/16/2019 CLINICAL DATA:  Head trauma, minor (Age >= 65y) Dementia patient with weakness and decreased appetite, decreased activity. EXAM: CT HEAD WITHOUT CONTRAST TECHNIQUE: Contiguous axial images were obtained from the base of the skull through the vertex without intravenous contrast. COMPARISON:  Remote exam 01/02/2010 FINDINGS: Brain: No intracranial hemorrhage, mass effect, or midline shift. Generalized atrophy. No hydrocephalus. The basilar cisterns are patent. Moderate periventricular and deep white matter hypodensity most consistent with chronic small vessel ischemia. No evidence of territorial infarct  or acute ischemia. No extra-axial or intracranial fluid collection. Vascular: Atherosclerosis of skullbase vasculature without hyperdense vessel or abnormal calcification. Skull: No fracture or focal lesion. Sinuses/Orbits: No acute findings. Bilateral cataract resection. Other: None. IMPRESSION: 1. No acute intracranial abnormality. No skull fracture. 2. Generalized atrophy and chronic small vessel ischemia. Electronically Signed   By: Keith Rake M.D.   On: 11/16/2019  22:26     Echo pending  TELEMETRY: Sinus rhythm:  ASSESSMENT AND PLAN:  Active Problems:   Elevated troponin   Acute renal failure (ARF) (HCC)   Generalized weakness    1.  Borderline elevated troponin, in the absence of chest pain or new ECG changes, in patient with history of borderline elevated troponin, occurred  in the setting of generalized weakness /poor p.o. intake  / dehydration / acute renal failure / anorexia, likely demand supply ischemia, not due to acute coronary syndrome 2.  Acute renal failure, secondary to failure to thrive / poor p.o. intake / dehydration 3.  Dementia / anorexia / failure to thrive  Recommendations  1.  Agree with overall current therapy 2.  Continue low dose aspirin 3.  Review 2D echocardiogram 4.  No further cardiac recommendations at this time 5.  Follow-up with Dr. Ubaldo Glassing after discharge  Sign off for now, please call if any questions   Isaias Cowman, MD, PhD, [facility] 11/18/2019 9:21 AM

## 2019-11-18 NOTE — Progress Notes (Signed)
Patient ID: Maria Bush, female   DOB: [birth year], 86 y.o.   MRN: [id number]  Patient's son was given option for discharge into rehab however he wanted to take patient home. He feels a bit overwhelmed right now with patient's advanced dementia and care required and is in agreement with rehab. Social worker aware and looking for bed.  Cancel discharge

## 2019-11-19 MED ORDER — SODIUM CHLORIDE 0.9% FLUSH
10.0000 mL | Freq: Two times a day (BID) | INTRAVENOUS | Status: DC
  Administered 2019-11-19 – 2019-11-21 (×4): 10 mL via INTRAVENOUS

## 2019-11-19 MED ORDER — ENOXAPARIN SODIUM 40 MG/0.4ML ~~LOC~~ SOLN
40.0000 mg | SUBCUTANEOUS | Status: DC
  Administered 2019-11-20 – 2019-11-21 (×2): 40 mg via SUBCUTANEOUS
  Filled 2019-11-19 (×2): qty 0.4

## 2019-11-19 NOTE — Progress Notes (Signed)
Anticoagulation monitoring(Lovenox):  86 yo  female ordered Lovenox 30 mg Q24h  Filed Weights   11/16/19 1549 11/17/19 0920  Weight: 120 lb (54.4 kg) 113 lb 11.2 oz (51.6 kg)   Body mass index is 20.8 kg/m.   Lab Results  Component Value Date   CREATININE 0.97 11/18/2019   CREATININE 1.47 (H) 11/17/2019   CREATININE 1.07 (H) 11/16/2019   Estimated Creatinine Clearance: 32.3 mL/min (by C-G formula based on SCr of 0.97 mg/dL). Hemoglobin & Hematocrit     Component Value Date/Time   HGB 12.2 11/17/2019 0521   HGB 11.4 11/11/2018 1524   HCT 35.3 (L) 11/17/2019 0521   HCT 34.3 11/11/2018 1524     Per Protocol for Patient with estCrcl > 30 ml/min and BMI < 40, will transition to Lovenox 40 mg Q24h.

## 2019-11-19 NOTE — Progress Notes (Signed)
Maria Bush NAME: Maria Bush    MR#:  [id number]  DATE OF BIRTH:  05-08-1930  SUBJECTIVE:   No new issues per RN. Patient remains confused at baseline due to her advanced dementia. REVIEW OF SYSTEMS:   Review of Systems  Unable to perform ROS: Dementia   Tolerating Diet:yes Tolerating PT: rehab  DRUG ALLERGIES:   Allergies  Allergen Reactions  . Cephalexin Shortness Of Breath  . Aricept [Donepezil]     Hallucinations   . Prednisone Nausea And Vomiting  . Sulfa Antibiotics Itching  . Penicillins Rash    VITALS:  Blood pressure 118/77, pulse 77, temperature 97.9 F (36.6 C), resp. rate 18, height 5\' 2"  (1.575 m), weight 51.6 kg, SpO2 99 %.  PHYSICAL EXAMINATION:   Physical Exam limited exam due to advanced dementia  GENERAL:  86 y.o.-year-old patient lying in the bed with no acute distress. Thin frail EYES: Pupils equal, round, reactive to light and accommodation. No scleral icterus. Extraocular muscles intact.  HEENT: Head atraumatic, normocephalic. Oropharynx and nasopharynx clear.  NECK:  Supple, no jugular venous distention. No thyroid enlargement, no tenderness.  LUNGS: Normal breath sounds bilaterally, no wheezing, rales, rhonchi. No use of accessory muscles of respiration.  CARDIOVASCULAR: S1, S2 normal. No murmurs, rubs, or gallops.  ABDOMEN: Soft, nontender, nondistended. Bowel sounds present. No organomegaly or mass.  EXTREMITIES: No cyanosis, clubbing or edema b/l.    NEUROLOGIC: grossly nonfocal PSYCHIATRIC:  patient is sleepy this morning but response to verbal commands. Baseline dementia SKIN: No obvious rash, lesion, or ulcer.   LABORATORY PANEL:  CBC Recent Labs  Lab 11/17/19 0521  WBC 13.3*  HGB 12.2  HCT 35.3*  PLT 276    Chemistries  Recent Labs  Lab 11/17/19 0521 11/18/19 0517  NA 142 141  K 3.9 3.5  CL 103 107  CO2 26 20*  GLUCOSE 114* 76  BUN 56* 46*  CREATININE 1.47*  0.97  CALCIUM 8.6* 8.6*  AST 30  --   ALT 21  --   ALKPHOS 52  --   BILITOT 1.8*  --    Cardiac Enzymes No results for input(s): TROPONINI in the last 168 hours. RADIOLOGY:  ECHOCARDIOGRAM COMPLETE  Result Date: 11/18/2019   ECHOCARDIOGRAM REPORT   Patient Name:   Maria Bush Date of Exam: 11/17/2019 Medical Rec #:  [id number]         Height:       62.0 in Accession #:    [id number]        Weight:       113.7 lb Date of Birth:  [birth year]         BSA:          1.50 m Patient Age:    61 years          BP:           107/67 mmHg Patient Gender: F                 HR:           49 bpm. Exam Location:  [facility] Procedure: 2D Echo, Color Doppler and Cardiac Doppler Indications:     Elevated troponin  History:         Patient has no prior history of Echocardiogram examinations.                  Signs/Symptoms:Murmur; Risk Factors:Hypertension.  Sonographer:  Humphrey Rolls RDCS (AE) Referring Phys:  [id number] Lyn Hollingshead PARASCHOS Diagnosing Phys: Marcina Millard MD  Sonographer Comments: Technically difficult study due to poor echo windows. Pt unable to move left arm for optimal apical images. IMPRESSIONS  1. Left ventricular ejection fraction, by visual estimation, is 60 to 65%. The left ventricle has normal function. There is no left ventricular hypertrophy.  2. The left ventricle has no regional wall motion abnormalities.  3. Global right ventricle has normal systolic function.The right ventricular size is normal. No increase in right ventricular wall thickness.  4. Left atrial size was mildly dilated.  5. Right atrial size was normal.  6. The mitral valve is normal in structure. Mild mitral valve regurgitation. No evidence of mitral stenosis.  7. The tricuspid valve is normal in structure.  8. Aortic valve regurgitation is mild to moderate.  9. The aortic valve is normal in structure. Aortic valve regurgitation is mild to moderate. No evidence of aortic valve sclerosis or stenosis. 10. The pulmonic valve was  normal in structure. Pulmonic valve regurgitation is not visualized. 11. Normal pulmonary artery systolic pressure. 12. The inferior vena cava is normal in size with greater than 50% respiratory variability, suggesting right atrial pressure of 3 mmHg. FINDINGS  Left Ventricle: Left ventricular ejection fraction, by visual estimation, is 60 to 65%. The left ventricle has normal function. The left ventricle has no regional wall motion abnormalities. There is no left ventricular hypertrophy. Normal left atrial pressure. Right Ventricle: The right ventricular size is normal. No increase in right ventricular wall thickness. Global RV systolic function is has normal systolic function. The tricuspid regurgitant velocity is 2.08 m/s, and with an assumed right atrial pressure  of 10 mmHg, the estimated right ventricular systolic pressure is normal at 27.3 mmHg. Left Atrium: Left atrial size was mildly dilated. Right Atrium: Right atrial size was normal in size Pericardium: There is no evidence of pericardial effusion. Mitral Valve: The mitral valve is normal in structure. Mild mitral valve regurgitation. No evidence of mitral valve stenosis by observation. MV peak gradient, 1.6 mmHg. Tricuspid Valve: The tricuspid valve is normal in structure. Tricuspid valve regurgitation is mild. Aortic Valve: The aortic valve is normal in structure. Aortic valve regurgitation is mild to moderate. Aortic regurgitation PHT measures 886 msec. The aortic valve is structurally normal, with no evidence of sclerosis or stenosis. Aortic valve mean gradient measures 2.0 mmHg. Aortic valve peak gradient measures 5.5 mmHg. Aortic valve area, by VTI measures 2.88 cm. Pulmonic Valve: The pulmonic valve was normal in structure. Pulmonic valve regurgitation is not visualized. Pulmonic regurgitation is not visualized. Aorta: The aortic root, ascending aorta and aortic arch are all structurally normal, with no evidence of dilitation or obstruction.  Venous: The inferior vena cava is normal in size with greater than 50% respiratory variability, suggesting right atrial pressure of 3 mmHg. IAS/Shunts: No atrial level shunt detected by color flow Doppler. There is no evidence of a patent foramen ovale. No ventricular septal defect is seen or detected. There is no evidence of an atrial septal defect.  LEFT VENTRICLE PLAX 2D LVIDd:         4.19 cm  Diastology LVIDs:         2.45 cm  LV e' lateral:   8.05 cm/s LV PW:         0.95 cm  LV E/e' lateral: 6.7 LV IVS:        1.01 cm  LV e' medial:    4.13 cm/s  LVOT diam:     2.30 cm  LV E/e' medial:  13.1 LV SV:         57 ml LV SV Index:   37.87 LVOT Area:     4.15 cm  LEFT ATRIUM             Index LA diam:        4.10 cm 2.73 cm/m LA Vol (A2C):   32.6 ml 21.68 ml/m LA Vol (A4C):   61.8 ml 41.10 ml/m LA Biplane Vol: 49.3 ml 32.78 ml/m  AORTIC VALVE                   PULMONIC VALVE AV Area (Vmax):    2.97 cm    PV Vmax:       0.79 m/s AV Area (Vmean):   3.12 cm    PV Vmean:      54.200 cm/s AV Area (VTI):     2.88 cm    PV VTI:        0.185 m AV Vmax:           117.00 cm/s PV Peak grad:  2.5 mmHg AV Vmean:          73.600 cm/s PV Mean grad:  1.0 mmHg AV VTI:            0.218 m AV Peak Grad:      5.5 mmHg AV Mean Grad:      2.0 mmHg LVOT Vmax:         83.50 cm/s LVOT Vmean:        55.200 cm/s LVOT VTI:          0.151 m LVOT/AV VTI ratio: 0.69 AI PHT:            886 msec  AORTA Ao Root diam: 3.60 cm MITRAL VALVE                        TRICUSPID VALVE MV Area (PHT): 4.80 cm             TR Peak grad:   17.3 mmHg MV Peak grad:  1.6 mmHg             TR Vmax:        208.00 cm/s MV Mean grad:  0.0 mmHg MV Vmax:       0.64 m/s             SHUNTS MV Vmean:      32.1 cm/s            Systemic VTI:  0.15 m MV VTI:        0.23 m               Systemic Diam: 2.30 cm MV PHT:        45.82 msec MV Decel Time: 158 msec MV E velocity: 54.30 cm/s 103 cm/s MV A velocity: 78.60 cm/s 70.3 cm/s MV E/A ratio:  0.69       1.5  Marcina Millard MD Electronically signed by Marcina Millard MD Signature Date/Time: 11/18/2019/9:36:59 AM    Final    ASSESSMENT AND PLAN:  *Maria Bush a89 y.o.femalewith a known history of dementiaanddiverticulosis, who presented to the emergency room with acute onset of generalized weakness. Her sonrpeorted tahtfor the last couple of days with significant anorexia. She did not have any reported nausea or vomiting or abdominal pain  1.Acute renal failure withGeneralized weakness. -appears prerenal azotemia poor  PO intake -recieved IV fluids -came in with creatinine of1.07--1.47--0.97 -avoid nephrotoxic agents  2. Elevated troponin I. -patient denies any chest pain. She has significant dementia. -Seen by cardiology continue conservative management -will continue aspirin  -pt will f/u dr Lady Gary as needed  3.Advance Dementia, failure to thrive with poor PO intake -discussed with patient's son who agrees with patient having poor appetite for last few weeks. Will continue to encourage to feed patient. -Received IV fluids. -Patient's son is now agreeable with rehab placement. -Social worker consulted. Bedside started.  4.Generalized weakness -seen by physical therapy recommends rehab.  -Rehab when bed opens  5. Hypertension. -patient's lisinopril and atenolol resumed now -lasix, chlorthalidone and amlodipine d/ced  6. GERD. -continue PPI therapy.  7. DVT prophylaxis. -Subcutaneous Lovenox.  Procedures:none Family communication:discussed with son Maria Bush on the phone Consults: cardiology Discharge Disposition:rehab  CODE STATUS:full code per son    TOTAL TIME TAKING CARE OF THIS PATIENT: 20** minutes.  >50% time spent on counselling and coordination of care  POSSIBLE D/C IN 1-2 DAYS, DEPENDING ON CLINICAL CONDITION.  Note: This dictation was prepared with Dragon dictation along with smaller phrase technology. Any  transcriptional errors that result from this process are unintentional.  Enedina Finner M.D on 11/19/2019 at 12:37 PM  Between 7am to 6pm - Pager - [phone]  After 6pm go to [email or web address]  [facility]   CC: Primary care physician; Malva Limes, MDPatient ID: Maria Bush, female   DOB: [birth year], 86 y.o.   MRN: [id number]

## 2019-11-19 NOTE — Plan of Care (Signed)
  Problem: Education: Goal: Knowledge of General Education information will improve Description: Including pain rating scale, medication(s)/side effects and non-pharmacologic comfort measures Outcome: Progressing   Problem: Safety: Goal: Ability to remain free from injury will improve Outcome: Progressing   Problem: Skin Integrity: Goal: Risk for impaired skin integrity will decrease Outcome: Progressing   

## 2019-11-19 NOTE — Plan of Care (Signed)
°  Problem: Education: °Goal: Knowledge of General Education information will improve °Description: Including pain rating scale, medication(s)/side effects and non-pharmacologic comfort measures °Outcome: Progressing °  °Problem: Clinical Measurements: °Goal: Cardiovascular complication will be avoided °Outcome: Progressing °  °Problem: Activity: °Goal: Risk for activity intolerance will decrease °Outcome: Progressing °  °

## 2019-11-20 ENCOUNTER — Ambulatory Visit: Payer: Self-pay | Admitting: Physician Assistant

## 2019-11-20 NOTE — TOC Progression Note (Signed)
Transition of Care [facility]) - Progression Note    Patient Details  Name: SOFI BRYARS MRN: [id number] Date of Birth: Jan 19, 1930  [facility]) CM/SW Contact  Shawn Route, RN Phone Number: 11/20/2019, 3:16 PM  Clinical Narrative:      Spoke with POA/Son about bed offers.  He is talking it over with his sister and will return my call. I expressed need for accepting bed offer today.   Expected Discharge Plan: Skilled Nursing Facility Barriers to Discharge: Continued Medical Work up  Expected Discharge Plan and Services Expected Discharge Plan: Skilled Nursing Facility     Post Acute Care Choice: Skilled Nursing Facility Living arrangements for the past 2 months: Single Family Home Expected Discharge Date: 11/18/19               DME Arranged: Bedside commode DME Agency: [employer] Date DME Agency Contacted: 11/18/19 Time DME Agency Contacted: 1600 Representative spoke with at DME Agency: Nida Boatman             Social Determinants of Health (SDOH) Interventions    Readmission Risk Interventions No flowsheet data found.

## 2019-11-20 NOTE — Progress Notes (Signed)
[facility] [place] NAME: Maria Bush    MR#:  [id number]  DATE OF BIRTH:  08-Dec-1929  SUBJECTIVE:   No new issues per RN. Patient remains confused at baseline due to her advanced dementia. REVIEW OF SYSTEMS:   Review of Systems  Unable to perform ROS: Dementia   Tolerating Diet:yes Tolerating PT: rehab  DRUG ALLERGIES:   Allergies  Allergen Reactions  . Cephalexin Shortness Of Breath  . Aricept [Donepezil]     Hallucinations   . Prednisone Nausea And Vomiting  . Sulfa Antibiotics Itching  . Penicillins Rash    VITALS:  Blood pressure 120/69, pulse 83, temperature 98.2 F (36.8 C), temperature source Oral, resp. rate 18, height 5\' 2"  (1.575 m), weight 51.6 kg, SpO2 100 %.  PHYSICAL EXAMINATION:   Physical Exam limited exam due to advanced dementia  GENERAL:  86 y.o.-year-old patient lying in the bed with no acute distress. Thin, fraile HEENT: Head atraumatic, normocephalic. Oropharynx and nasopharynx clear.  LUNGS: Normal breath sounds bilaterally, no wheezing, rales, rhonchi. No use of accessory muscles of respiration.  CARDIOVASCULAR: S1, S2 normal. No murmurs, rubs, or gallops.  ABDOMEN: Soft, nontender, nondistended. Bowel sounds present. No organomegaly or mass.  EXTREMITIES: No cyanosis, clubbing or edema b/l.    NEUROLOGIC: grossly nonfocal PSYCHIATRIC:  patient is sleepy this morning but response to verbal commands.  Baseline dementia SKIN: No obvious rash, lesion, or ulcer.   LABORATORY PANEL:  CBC Recent Labs  Lab 11/17/19 0521  WBC 13.3*  HGB 12.2  HCT 35.3*  PLT 276    Chemistries  Recent Labs  Lab 11/17/19 0521 11/18/19 0517  NA 142 141  K 3.9 3.5  CL 103 107  CO2 26 20*  GLUCOSE 114* 76  BUN 56* 46*  CREATININE 1.47* 0.97  CALCIUM 8.6* 8.6*  AST 30  --   ALT 21  --   ALKPHOS 52  --   BILITOT 1.8*  --    Cardiac Enzymes No results for input(s): TROPONINI in the last 168  hours. RADIOLOGY:  No results found. ASSESSMENT AND PLAN:  *Maria a86 y.o.femalewith a known history of Bush, who presented to the emergency room with acute onset of generalized weakness. Her sonrpeorted tahtfor the last couple of days with significant anorexia. She did not have any reported nausea or vomiting or abdominal pain  1.Acute renal failure withGeneralized weakness. -appears prerenal azotemia poor PO intake -recieved IV fluids -came in with creatinine of1.07--1.47--0.97 -avoid nephrotoxic agents  2. Elevated troponin I. -patient denies any chest pain. She has significant dementia. -Seen by cardiology continue conservative management -will continue aspirin  -pt will f/u dr Ubaldo Glassing as needed  3.Advance Dementia, failure to thrive with poor PO intake -discussed with patient's son who agrees with patient having poor appetite for last few weeks. Will continue to encourage to feed patient. -Received IV fluids. -Patient's son is now agreeable with rehab placement. -Social worker consulted. Bed search started.  4.Generalized weakness -seen by physical therapy recommends rehab. TOC for d/c planning -Repeat COVID ordered  5. Hypertension. -patient's lisinopril and atenolol resumed now -lasix, chlorthalidone and amlodipine d/ced  6. GERD. -continue PPI therapy.  7. DVT prophylaxis. -Subcutaneous Lovenox.  Procedures:none Family communication: son Maria Bush aware of the plans Consults: cardiology Discharge Disposition:rehab  CODE STATUS:full code per son    TOTAL TIME TAKING CARE OF THIS PATIENT: 20** minutes.  >50% time spent on counselling and coordination of care  Note: This dictation was prepared with Dragon dictation along with smaller phrase technology. Any transcriptional errors that result from this process are unintentional.  Enedina Finner M.D on 11/20/2019 at 2:51 PM  Between 7am  to 6pm - Pager - [phone]  After 6pm go to [email or web address]  [facility]   CC: Primary care physician; Malva Limes, MDPatient ID: Maria Bush, female   DOB: Mar 07, 1930, 86 y.o.   MRN: [id number]

## 2019-11-20 NOTE — Progress Notes (Signed)
PT Cancellation Note  Patient Details Name: SHARNIECE GIBBON MRN: [id number] DOB: 05-27-1930   Cancelled Treatment:    Reason Eval/Treat Not Completed: Patient's level of consciousness- Patient sleeping, able to rouse, however not oriented, or able to follow direction for participation in therapy. Will re-attempt at later time/day.    Leeandre Nordling 11/20/2019, 2:59 PM

## 2019-11-20 NOTE — Care Management Important Message (Signed)
Important Message  Patient Details  Name: Maria Bush MRN: [id number] Date of Birth: 12-03-1929   Medicare Important Message Given:  Yes     [name] 11/20/2019, 12:06 PM

## 2019-11-20 NOTE — TOC Progression Note (Deleted)
Transition of Care [facility]) - Progression Note    Patient Details  Name: Maria Bush MRN: [id number] Date of Birth: 11-21-1929  [facility]) CM/SW Contact  Shawn Route, RN Phone Number: 11/20/2019, 2:16 PM  Clinical Narrative:     Spoke with POA/Son about bed offers.  He is talking it over with his sister and will return my call.  I expressed need for accepting bed offer today.     Expected Discharge Plan: Skilled Nursing Facility Barriers to Discharge: Continued Medical Work up  Expected Discharge Plan and Services Expected Discharge Plan: Skilled Nursing Facility     Post Acute Care Choice: Skilled Nursing Facility Living arrangements for the past 2 months: Single Family Home Expected Discharge Date: 11/18/19               DME Arranged: Bedside commode DME Agency: [employer] Date DME Agency Contacted: 11/18/19 Time DME Agency Contacted: 1600 Representative spoke with at DME Agency: Nida Boatman             Social Determinants of Health (SDOH) Interventions    Readmission Risk Interventions No flowsheet data found.

## 2019-11-21 DIAGNOSIS — Z741 Need for assistance with personal care: Secondary | ICD-10-CM | POA: Diagnosis not present

## 2019-11-21 DIAGNOSIS — R2681 Unsteadiness on feet: Secondary | ICD-10-CM | POA: Diagnosis not present

## 2019-11-21 DIAGNOSIS — K222 Esophageal obstruction: Secondary | ICD-10-CM | POA: Diagnosis not present

## 2019-11-21 DIAGNOSIS — Z23 Encounter for immunization: Secondary | ICD-10-CM | POA: Diagnosis not present

## 2019-11-21 DIAGNOSIS — M5186 Other intervertebral disc disorders, lumbar region: Secondary | ICD-10-CM | POA: Diagnosis not present

## 2019-11-21 DIAGNOSIS — R7989 Other specified abnormal findings of blood chemistry: Secondary | ICD-10-CM | POA: Diagnosis not present

## 2019-11-21 DIAGNOSIS — F0280 Dementia in other diseases classified elsewhere without behavioral disturbance: Secondary | ICD-10-CM | POA: Diagnosis not present

## 2019-11-21 DIAGNOSIS — N170 Acute kidney failure with tubular necrosis: Secondary | ICD-10-CM | POA: Diagnosis not present

## 2019-11-21 DIAGNOSIS — F0390 Unspecified dementia without behavioral disturbance: Secondary | ICD-10-CM | POA: Diagnosis not present

## 2019-11-21 DIAGNOSIS — M6281 Muscle weakness (generalized): Secondary | ICD-10-CM | POA: Diagnosis not present

## 2019-11-21 DIAGNOSIS — R1311 Dysphagia, oral phase: Secondary | ICD-10-CM | POA: Diagnosis not present

## 2019-11-21 DIAGNOSIS — R41841 Cognitive communication deficit: Secondary | ICD-10-CM | POA: Diagnosis not present

## 2019-11-21 DIAGNOSIS — N179 Acute kidney failure, unspecified: Secondary | ICD-10-CM | POA: Diagnosis not present

## 2019-11-21 DIAGNOSIS — G309 Alzheimer's disease, unspecified: Secondary | ICD-10-CM | POA: Diagnosis not present

## 2019-11-21 DIAGNOSIS — I10 Essential (primary) hypertension: Secondary | ICD-10-CM | POA: Diagnosis not present

## 2019-11-21 DIAGNOSIS — R531 Weakness: Secondary | ICD-10-CM | POA: Diagnosis not present

## 2019-11-21 DIAGNOSIS — R627 Adult failure to thrive: Secondary | ICD-10-CM | POA: Diagnosis not present

## 2019-11-21 DIAGNOSIS — K2100 Gastro-esophageal reflux disease with esophagitis, without bleeding: Secondary | ICD-10-CM | POA: Diagnosis not present

## 2019-11-21 LAB — SARS CORONAVIRUS 2 (TAT 6-24 HRS): SARS Coronavirus 2: NEGATIVE

## 2019-11-21 NOTE — TOC Transition Note (Signed)
Transition of Care [facility]) - CM/SW Discharge Note   Patient Details  Name: Maria Bush MRN: [id number] Date of Birth: 02/19/1930  Transition of Care [facility]) CM/SW Contact:  Barrie Dunker, RN Phone Number: 11/21/2019, 9:01 AM   Clinical Narrative:    I called the patient's son Ethelene Browns and reviewed the bed offers with him,  He chose the bed offer from [facility] in [place], I explained that the patient would DC today to the facility, he stated understanding, I sent the DC information thru the Hub to [facility] and called Raiford Noble and left a message alerting him of the DC. The Bedside nurse is to call report to [facility] and then call EMS for transport   Final next level of care: Skilled Nursing Facility Barriers to Discharge: Continued Medical Work up   Patient Goals and CMS Choice Patient states their goals for this hospitalization and ongoing recovery are:: To go to SNF the return back home with son. CMS Medicare.gov Compare Post Acute Care list provided to:: Patient Represenative (must comment) Choice offered to / list presented to : Adult Children  Discharge Placement                       Discharge Plan and Services     Post Acute Care Choice: Skilled Nursing Facility          DME Arranged: Bedside commode DME Agency: [employer] Date DME Agency Contacted: 11/18/19 Time DME Agency Contacted: 1600 Representative spoke with at DME Agency: Nida Boatman            Social Determinants of Health (SDOH) Interventions     Readmission Risk Interventions No flowsheet data found.

## 2019-11-21 NOTE — Progress Notes (Signed)
Report called Amil Amen, Facilities manager at [employer]. EMS called for patient transport

## 2019-11-21 NOTE — Discharge Summary (Signed)
[facility] [place] [facility]   PATIENT NAME: Maria Bush    MR#:  [id number]  DATE OF BIRTH:  April 24, 1930  DATE OF ADMISSION:  11/16/2019 ADMITTING PHYSICIAN: Enedina Finner, MD  DATE OF DISCHARGE: 11/21/2019  PRIMARY CARE PHYSICIAN: Malva Limes, MD    ADMISSION DIAGNOSIS:  Cough [R05] Elevated troponin [R77.8] Generalized weakness [R53.1] Acute renal failure (ARF) (HCC) [N17.9]  DISCHARGE DIAGNOSIS:  acute renal failure-- resolved failure to thrive/poor PO intake advanced dementia  SECONDARY DIAGNOSIS:   Past Medical History:  Diagnosis Date  . Acute blood loss anemia   . Arthritis 2005  . Diverticulosis   . GI bleed   . Heart murmur 2005  . Hypertension   . Memory loss     HOSPITAL COURSE:   [name], who presented to the emergency room with acute onset of generalized weakness. Her son rpeorted taht  for the last couple of days with significant anorexia. She did not have any reported nausea or vomiting or abdominal pain  1. Acute renal failure with Generalized weakness. -appears prerenal azotemia poor PO intake -recieved IV fluids -came in with creatinine of 1.07--1.47--0.97 -avoid nephrotoxic agents  2. Elevated troponin I.  -patient denies any chest pain. She has significant dementia. -Seen by cardiology continue conservative management - will continue aspirin  -pt will f/u dr Lady Gary as needed  3. Advance Dementia, failure to thrive with poor PO intake -discussed with patient's son who agrees with patient having poor appetite for last few weeks - continue to encourage to feed patient due to her dementia --Son does understand patient has advanced dementia and make continue to get worse. -Family agreeable for STR  4.Generalized weakness -seen by physical therapy recommends rehab. Son [name] now in agreement with rehab  5.  Hypertension. -patient's lisinopril and atenolol resumed now -lasix, chlorthalidone and amlodipine d/ced  6. GERD. -continue PPI therapy.  7. DVT prophylaxis. -Subcutaneous Lovenox.  Procedures: none Family communication : son [name] aware  Consults :none Discharge Disposition :SNF CODE STATUS: full code per son  D/c to SNF today Repeat COVID Negative CONSULTS OBTAINED:    DRUG ALLERGIES:   Allergies  Allergen Reactions  . Cephalexin Shortness Of Breath  . Aricept [Donepezil]     Hallucinations   . Prednisone Nausea And Vomiting  . Sulfa Antibiotics Itching  . Penicillins Rash    DISCHARGE MEDICATIONS:   Allergies as of 11/21/2019      Reactions   Cephalexin Shortness Of Breath   Aricept [donepezil]    Hallucinations   Prednisone Nausea And Vomiting   Sulfa Antibiotics Itching   Penicillins Rash      Medication List    STOP taking these medications   amLODipine 5 MG tablet Commonly known as: NORVASC   Calcium Carbonate-Vitamin D 600-200 MG-UNIT Tabs   chlorthalidone 25 MG tablet Commonly known as: HYGROTON   furosemide 20 MG tablet Commonly known as: LASIX   mometasone 0.1 % cream Commonly known as: ELOCON   nitrofurantoin 100 MG capsule Commonly known as: Macrodantin   potassium chloride 10 MEQ tablet Commonly known as: KLOR-CON   traMADol 50 MG tablet Commonly known as: ULTRAM     TAKE these medications   aspirin 81 MG EC tablet Take 81 mg by mouth daily.   atenolol 25 MG tablet Commonly known as: TENORMIN TAKE 1 TABLET BY MOUTH DAILY   Calcium Carbonate-Vitamin D3 600-400 MG-UNIT Tabs Take 1 tablet  by mouth daily.   FeroSul 325 (65 FE) MG tablet Generic drug: ferrous sulfate TAKE 1 TABLET(325 MG) BY MOUTH DAILY   latanoprost 0.005 % ophthalmic solution Commonly known as: XALATAN Place 1 drop into both eyes at bedtime.   lisinopril 20 MG tablet Commonly known as: ZESTRIL Take 1 tablet (20 mg total) by  mouth daily.   loratadine 10 MG tablet Commonly known as: CLARITIN Take by mouth daily as needed.   mirabegron ER 50 MG Tb24 tablet Commonly known as: MYRBETRIQ Take 1 tablet (50 mg total) by mouth daily.   mirtazapine 30 MG tablet Commonly known as: REMERON Take 1 tablet (30 mg total) by mouth at bedtime.   omeprazole 40 MG capsule Commonly known as: PRILOSEC TAKE ONE CAPSULE BY MOUTH DAILY What changed: how much to take   Travatan Z 0.004 % Soln ophthalmic solution Generic drug: Travoprost (BAK Free) Place 1 drop into both eyes at bedtime.            Durable Medical Equipment  (From admission, onward)         Start     Ordered   11/18/19 1432  For home use only DME Bedside commode  Once    Question:  Patient needs a bedside commode to treat with the following condition  Answer:  General weakness   11/18/19 1431          If you experience worsening of your admission symptoms, develop shortness of breath, life threatening emergency, suicidal or homicidal thoughts you must seek medical attention immediately by calling 911 or calling your MD immediately  if symptoms less severe.  You Must read complete instructions/literature along with all the possible adverse reactions/side effects for all the Medicines you take and that have been prescribed to you. Take any new Medicines after you have completely understood and accept all the possible adverse reactions/side effects.   Please note  You were cared for by a hospitalist during your hospital stay. If you have any questions about your discharge medications or the care you received while you were in the hospital after you are discharged, you can call the unit and asked to speak with the hospitalist on call if the hospitalist that took care of you is not available. Once you are discharged, your primary care physician will handle any further medical issues. Please note that NO REFILLS for any discharge medications will be  authorized once you are discharged, as it is imperative that you return to your primary care physician (or establish a relationship with a primary care physician if you do not have one) for your aftercare needs so that they can reassess your need for medications and monitor your lab values. Today   SUBJECTIVE   Confused at baseline although opens eyes drinks water when fed. No new issues per RN  VITAL SIGNS:  Blood pressure 125/79, pulse 100, temperature 98.5 F (36.9 C), temperature source Oral, resp. rate 18, height 5\' 2"  (1.575 m), weight 51.6 kg, SpO2 98 %.  I/O:    Intake/Output Summary (Last 24 hours) at 11/21/2019 0755 Last data filed at 11/21/2019 0500 Gross per 24 hour  Intake 0 ml  Output 200 ml  Net -200 ml    PHYSICAL EXAMINATION:  GENERAL:  86 y.o.-year-old patient lying in the bed with no acute distress. Thin, fraile EYES: Pupils equal, round, reactive to light and accommodation. No scleral icterus. 97  HEENT: Head atraumatic, normocephalic. Oropharynx and nasopharynx clear. Dry oral mucosa LUNGS: Normal  breath sounds bilaterally, no wheezing, rales,rhonchi or crepitation. No use of accessory muscles of respiration.  CARDIOVASCULAR: S1, S2 normal. No murmurs, rubs, or gallops.  ABDOMEN: Soft, non-tender, non-distended. Bowel sounds present. No organomegaly or mass.  EXTREMITIES: No pedal edema, cyanosis, or clubbing.  NEUROLOGIC:no  focal weakness.  PSYCHIATRIC:  patient is opens eyes to VC. Confused at baseline  SKIN: No obvious rash, lesion, or ulcer.   DATA REVIEW:   CBC  Recent Labs  Lab 11/17/19 0521  WBC 13.3*  HGB 12.2  HCT 35.3*  PLT 276    Chemistries  Recent Labs  Lab 11/17/19 0521 11/18/19 0517  NA 142 141  K 3.9 3.5  CL 103 107  CO2 26 20*  GLUCOSE 114* 76  BUN 56* 46*  CREATININE 1.47* 0.97  CALCIUM 8.6* 8.6*  AST 30  --   ALT 21  --   ALKPHOS 52  --   BILITOT 1.8*  --     Microbiology Results   Recent Results (from the past 240  hour(s))  SARS CORONAVIRUS 2 (TAT 6-24 HRS) Nasopharyngeal Nasopharyngeal Swab     Status: None   Collection Time: 11/17/19 12:52 AM   Specimen: Nasopharyngeal Swab  Result Value Ref Range Status   SARS Coronavirus 2 NEGATIVE NEGATIVE Final    Comment: (NOTE) SARS-CoV-2 target nucleic acids are NOT DETECTED. The SARS-CoV-2 RNA is generally detectable in upper and lower respiratory specimens during the acute phase of infection. Negative results do not preclude SARS-CoV-2 infection, do not rule out co-infections with other pathogens, and should not be used as the sole basis for treatment or other patient management decisions. Negative results must be combined with clinical observations, patient history, and epidemiological information. The expected result is Negative. Fact Sheet for Patients: SugarRoll.be Fact Sheet for Healthcare Providers: https://www.woods-mathews.com/ This test is not yet approved or cleared by the [place] FDA and  has been authorized for detection and/or diagnosis of SARS-CoV-2 by FDA under an Emergency Use Authorization (EUA). This EUA will remain  in effect (meaning this test can be used) for the duration of the COVID-19 declaration under Section 56 4(b)(1) of the Act, 21 U.S.C. section 360bbb-3(b)(1), unless the authorization is terminated or revoked sooner. Performed at [facility], [place] [address], [place], [place] [address]   SARS CORONAVIRUS 2 (TAT 6-24 HRS) Nasopharyngeal Nasopharyngeal Swab     Status: None   Collection Time: 11/20/19  2:52 PM   Specimen: Nasopharyngeal Swab  Result Value Ref Range Status   SARS Coronavirus 2 NEGATIVE NEGATIVE Final    Comment: (NOTE) SARS-CoV-2 target nucleic acids are NOT DETECTED. The SARS-CoV-2 RNA is generally detectable in upper and lower respiratory specimens during the acute phase of infection. Negative results do not preclude SARS-CoV-2 infection, do not  rule out co-infections with other pathogens, and should not be used as the sole basis for treatment or other patient management decisions. Negative results must be combined with clinical observations, patient history, and epidemiological information. The expected result is Negative. Fact Sheet for Patients: SugarRoll.be Fact Sheet for Healthcare Providers: https://www.woods-mathews.com/ This test is not yet approved or cleared by the [place] FDA and  has been authorized for detection and/or diagnosis of SARS-CoV-2 by FDA under an Emergency Use Authorization (EUA). This EUA will remain  in effect (meaning this test can be used) for the duration of the COVID-19 declaration under Section 56 4(b)(1) of the Act, 21 U.S.C. section 360bbb-3(b)(1), unless the authorization is terminated or  revoked sooner. Performed at [facility], [address] [place], [place] [address]     RADIOLOGY:  No results found.   CODE STATUS:     Code Status Orders  (From admission, onward)         Start     Ordered   11/17/19 0049  Full code  Continuous     11/17/19 0056        Code Status History    Date Active Date Inactive Code Status Order ID Comments User Context   03/26/2017 1416 03/29/2017 2148 Full Code [id number]  Gwenyth Bender, NP Inpatient   03/25/2017 1856 03/26/2017 1405 Full Code [id number]  Auburn Bilberry, MD Inpatient   Advance Care Planning Activity    Advance Directive Documentation     Most Recent Value  Type of Advance Directive  Healthcare Power of Attorney, Living will  Pre-existing out of facility DNR order (yellow form or pink MOST form)  --  "MOST" Form in Place?  --       TOTAL TIME TAKING CARE OF THIS PATIENT: *40* minutes.    Enedina Finner M.D on 11/21/2019 at 7:55 AM  Between 7am to 6pm - Pager - [phone] After 6pm go to [email or web address] - password [email or web address]  [facility]    CC: Primary care physician;  Malva Limes, MD

## 2019-11-28 DIAGNOSIS — R7989 Other specified abnormal findings of blood chemistry: Secondary | ICD-10-CM | POA: Diagnosis not present

## 2019-11-28 DIAGNOSIS — F0390 Unspecified dementia without behavioral disturbance: Secondary | ICD-10-CM | POA: Diagnosis not present

## 2019-11-28 DIAGNOSIS — I10 Essential (primary) hypertension: Secondary | ICD-10-CM | POA: Diagnosis not present

## 2019-11-28 DIAGNOSIS — N179 Acute kidney failure, unspecified: Secondary | ICD-10-CM | POA: Diagnosis not present

## 2019-11-28 DIAGNOSIS — R627 Adult failure to thrive: Secondary | ICD-10-CM | POA: Diagnosis not present

## 2019-12-04 ENCOUNTER — Ambulatory Visit: Payer: Self-pay | Admitting: Pharmacist

## 2019-12-04 ENCOUNTER — Ambulatory Visit: Payer: Medicare Other | Admitting: Urology

## 2019-12-04 NOTE — Chronic Care Management (AMB) (Signed)
  Chronic Care Management   Note  12/04/2019 Name: Maria Bush MRN: [id number] DOB: May 27, 1930  86 y.o. year old female referred to Chronic Care Management by Dr. Sherrie Mustache for medication management. Returning voicemail from patient's son, Ree Kida. Of note, patient is/was in SNF following hospitalization for weakness. Cancelled OV with Dr. Sherrie Mustache 11/20/19.   Was unable to reach patient's son via telephone today and have left HIPAA compliant voicemail asking him to return my call. (unsuccessful outreach #1).  Follow up plan: A HIPPA compliant phone message was left for the patient providing contact information and requesting a return call.  The care management team will reach out to the patient again over the next 5-7 days.   Karalee Height, PharmD Clinical Pharmacist [facility] [phone]

## 2019-12-07 ENCOUNTER — Telehealth: Payer: Self-pay

## 2019-12-07 NOTE — Telephone Encounter (Signed)
Copied from CRM [id number]. Topic: General - Inquiry >> Dec 06, 2019  4:09 PM Leary Roca wrote: Reason for CRM:  Wynona Canes from [facility] calling for progress noted from dec 2020 for pt . Please advise   Call back [phone]

## 2019-12-07 NOTE — Telephone Encounter (Signed)
[facility] Fax number [phone].

## 2019-12-07 NOTE — Telephone Encounter (Signed)
I called and spoke with Maria Bush from [facility]. She states they  received everything for the Rollator except clinical notes. Maria Bush is requesting that our office fax over office notes to support medical need for the rollator. I called patient's son Maria Bush and confirmed that [employer] was the company that he chose to use for patient's Rollator. Please advise which office note to send. Office note from 09/19/2019?

## 2019-12-07 NOTE — Telephone Encounter (Signed)
Can send note for 09-18-2020

## 2019-12-08 NOTE — Telephone Encounter (Signed)
Done. Office note faxed.

## 2019-12-12 DIAGNOSIS — N179 Acute kidney failure, unspecified: Secondary | ICD-10-CM | POA: Diagnosis not present

## 2019-12-12 DIAGNOSIS — R7989 Other specified abnormal findings of blood chemistry: Secondary | ICD-10-CM | POA: Diagnosis not present

## 2019-12-12 DIAGNOSIS — I10 Essential (primary) hypertension: Secondary | ICD-10-CM | POA: Diagnosis not present

## 2019-12-12 DIAGNOSIS — F0390 Unspecified dementia without behavioral disturbance: Secondary | ICD-10-CM | POA: Diagnosis not present

## 2019-12-12 DIAGNOSIS — R627 Adult failure to thrive: Secondary | ICD-10-CM | POA: Diagnosis not present

## 2019-12-14 ENCOUNTER — Ambulatory Visit: Payer: Self-pay | Admitting: Pharmacist

## 2019-12-19 ENCOUNTER — Telehealth: Payer: Self-pay | Admitting: Family Medicine

## 2019-12-19 NOTE — Telephone Encounter (Signed)
Copied from CRM [id number]. Topic: General - Other >> Dec 19, 2019 11:12 AM Tamela Oddi wrote: Reason for CRM: Called to request the clinical notes from last visit regarding a Rotator that was requested by patient.  Please call to discuss at [phone]

## 2019-12-19 NOTE — Telephone Encounter (Signed)
Patient is looking for medical records can you help Thx

## 2019-12-21 NOTE — Chronic Care Management (AMB) (Signed)
  Chronic Care Management   Note  12/21/2019 Name: Maria Bush MRN: [id number] DOB: [birth year]  86 y.o. year old female referred to Chronic Care Management by Dr. Sherrie Mustache for medication management. Returning voicemail from patient's son, Ree Kida. Of note, patient is/was in SNF following hospitalization for weakness. Cancelled OV with Dr. Sherrie Mustache 11/20/19.   Was unable to reach patient's son via telephone today and have left HIPAA compliant voicemail asking him to return my call. (unsuccessful outreach #2).  Follow up plan: A HIPPA compliant phone message was left for the patient providing contact information and requesting a return call.  The care management team will reach out to the patient again over the next 5-7 days.   Karalee Height, PharmD Clinical Pharmacist [facility] [phone]

## 2019-12-25 NOTE — Telephone Encounter (Signed)
Notes were originally faxed on 12/08/2019. I called [facility] and they state that they never received them, so I re faxed office notes from 09/18/2020.

## 2019-12-25 NOTE — Telephone Encounter (Signed)
Kristen with [employer] stated they received the order for a Rolator but they need clinical notes that discuss pt's need for the Rolator. Please advise what OV note needs to be faxed @ [phone]. Thanks TNP

## 2019-12-28 ENCOUNTER — Telehealth: Payer: Self-pay | Admitting: Family Medicine

## 2019-12-28 DIAGNOSIS — Z9049 Acquired absence of other specified parts of digestive tract: Secondary | ICD-10-CM | POA: Diagnosis not present

## 2019-12-28 DIAGNOSIS — K219 Gastro-esophageal reflux disease without esophagitis: Secondary | ICD-10-CM | POA: Diagnosis not present

## 2019-12-28 DIAGNOSIS — F0390 Unspecified dementia without behavioral disturbance: Secondary | ICD-10-CM | POA: Diagnosis not present

## 2019-12-28 DIAGNOSIS — R627 Adult failure to thrive: Secondary | ICD-10-CM | POA: Diagnosis not present

## 2019-12-28 DIAGNOSIS — R011 Cardiac murmur, unspecified: Secondary | ICD-10-CM | POA: Diagnosis not present

## 2019-12-28 DIAGNOSIS — M5186 Other intervertebral disc disorders, lumbar region: Secondary | ICD-10-CM | POA: Diagnosis not present

## 2019-12-28 DIAGNOSIS — Z9181 History of falling: Secondary | ICD-10-CM | POA: Diagnosis not present

## 2019-12-28 DIAGNOSIS — M1990 Unspecified osteoarthritis, unspecified site: Secondary | ICD-10-CM | POA: Diagnosis not present

## 2019-12-28 DIAGNOSIS — K5790 Diverticulosis of intestine, part unspecified, without perforation or abscess without bleeding: Secondary | ICD-10-CM | POA: Diagnosis not present

## 2019-12-28 DIAGNOSIS — I10 Essential (primary) hypertension: Secondary | ICD-10-CM | POA: Diagnosis not present

## 2019-12-28 NOTE — Telephone Encounter (Signed)
That's fine

## 2019-12-28 NOTE — Telephone Encounter (Signed)
Home Health Verbal Orders - Caller/Agency: Trey Paula with [facility] Callback Number: [phone], OK to leave message  Requesting:  PT Frequency: 1x a week for 1 week, 2x a week for 3 weeks, 1x a week for 4 weeks.  Requesting:  Home Health Aid Frequency:  BEGIN week of 01/01/2020 - 2x a week for 7 weeks  Requesting:  Social Work Frequency:  Evaluation for long term planning

## 2019-12-29 ENCOUNTER — Encounter: Payer: Self-pay | Admitting: Family Medicine

## 2019-12-29 NOTE — Telephone Encounter (Signed)
Verbal orders given to [facility] with [facility]

## 2019-12-29 NOTE — Progress Notes (Signed)
Patient: Maria Bush Female    DOB: 02-16-1930   86 y.o.   MRN: [id number] Visit Date: 01/01/2020  Today's Provider: Mila Merry, MD   Chief Complaint  Patient presents with  . Hospitalization Follow-up  . Transitions Of Care   Subjective:     HPI   Virtual Visit via Telephone Note  I connected with Maria Bush on 01/05/20 at  2:40 PM EST by telephone and verified that I am speaking with the correct person using two identifiers.  Location: Patient: Home Provider: [facility]   I discussed the limitations, risks, security and privacy concerns of performing an evaluation and management service by telephone and the availability of in person appointments. I also discussed with the patient that there may be a patient responsible charge related to this service. The patient expressed understanding and agreed to proceed.    Follow up Hospitalization  Patient was admitted to [facility] on 11/16/2019 and discharged on 11/21/2019. Patient was discharged from [facility] to [facility].  She was treated for Generalized weakness.  She reports good compliance with treatment. She reports this condition is Improved.  Has been a little stiff since getting home. Had home health assessment last week and physical therapist starting tomorrow, and is working on getting a Child psychotherapist.  ------------------------------------------------------------------------------------    Allergies  Allergen Reactions  . Cephalexin Shortness Of Breath  . Aricept [Donepezil]     Hallucinations   . Prednisone Nausea And Vomiting  . Sulfa Antibiotics Itching  . Penicillins Rash     Current Outpatient Medications:  .  atenolol (TENORMIN) 25 MG tablet, TAKE 1 TABLET BY MOUTH DAILY, Disp: 270 tablet, Rfl: 3 .  Calcium Carbonate-Vitamin D3 600-400 MG-UNIT TABS, Take 1 tablet by mouth daily., Disp: 60 tablet, Rfl: 6 .  chlorthalidone (HYGROTON) 25 MG tablet, Take 25 mg by mouth  daily., Disp: , Rfl:  .  FEROSUL 325 (65 Fe) MG tablet, TAKE 1 TABLET(325 MG) BY MOUTH DAILY, Disp: 90 tablet, Rfl: 4 .  furosemide (LASIX) 20 MG tablet, Take 20 mg by mouth daily as needed for fluid., Disp: , Rfl:  .  latanoprost (XALATAN) 0.005 % ophthalmic solution, Place 1 drop into both eyes at bedtime., Disp: , Rfl:  .  lisinopril (PRINIVIL,ZESTRIL) 20 MG tablet, Take 1 tablet (20 mg total) by mouth daily., Disp: 90 tablet, Rfl: 3 .  loratadine (CLARITIN) 10 MG tablet, Take by mouth daily as needed. , Disp: , Rfl:  .  Travoprost, BAK Free, (TRAVATAN Z) 0.004 % SOLN ophthalmic solution, Place 1 drop into both eyes at bedtime. , Disp: , Rfl:  .  aspirin 81 MG EC tablet, Take 81 mg by mouth daily., Disp: , Rfl:  .  mirtazapine (REMERON) 30 MG tablet, Take 1 tablet (30 mg total) by mouth at bedtime. (Patient not taking: Reported on 12/29/2019), Disp: 30 tablet, Rfl: 0 .  omeprazole (PRILOSEC) 40 MG capsule, TAKE ONE CAPSULE BY MOUTH DAILY (Patient not taking: No sig reported), Disp: 30 capsule, Rfl: 11  Review of Systems  Constitutional: Negative for appetite change, chills, fatigue and fever.  Respiratory: Negative for chest tightness and shortness of breath.   Cardiovascular: Negative for chest pain and palpitations.  Gastrointestinal: Negative for abdominal pain, nausea and vomiting.  Musculoskeletal:       Stiffness in leg muscles  Neurological: Positive for weakness. Negative for dizziness.    Social History   Tobacco Use  . Smoking status:  Never Smoker  . Smokeless tobacco: Never Used  Substance Use Topics  . Alcohol use: No      Objective:   BP 104/61   Pulse 72  Vitals:   01/01/20 1413  BP: 104/61  Pulse: 72  There is no height or weight on file to calculate BMI.   Physical Exam  Awake, alert, oriented x 2. In no apparent distress  No results found for any visits on 01/01/20.     Assessment & Plan    1. Dementia without behavioral disturbance, unspecified  dementia type ([place]) stable  2. Generalized weakness Improved since recentl hospitalization. Continue current medications.  Proceed with home physical therapy.     I discussed the assessment and treatment plan with the patient. The patient was provided an opportunity to ask questions and all were answered. The patient agreed with the plan and demonstrated an understanding of the instructions.   The patient was advised to call back or seek an in-person evaluation if the symptoms worsen or if the condition fails to improve as anticipated.  I provided 12 minutes of non-face-to-face time during this encounter.      Lelon Huh, MD  [facility]

## 2020-01-01 ENCOUNTER — Ambulatory Visit (INDEPENDENT_AMBULATORY_CARE_PROVIDER_SITE_OTHER): Payer: Medicare Other | Admitting: Family Medicine

## 2020-01-01 ENCOUNTER — Encounter: Payer: Self-pay | Admitting: Family Medicine

## 2020-01-01 VITALS — BP 104/61 | HR 72

## 2020-01-01 DIAGNOSIS — R531 Weakness: Secondary | ICD-10-CM

## 2020-01-01 DIAGNOSIS — F0390 Unspecified dementia without behavioral disturbance: Secondary | ICD-10-CM | POA: Diagnosis not present

## 2020-01-02 ENCOUNTER — Telehealth: Payer: Self-pay | Admitting: Family Medicine

## 2020-01-02 DIAGNOSIS — F0390 Unspecified dementia without behavioral disturbance: Secondary | ICD-10-CM | POA: Diagnosis not present

## 2020-01-02 DIAGNOSIS — I10 Essential (primary) hypertension: Secondary | ICD-10-CM | POA: Diagnosis not present

## 2020-01-02 DIAGNOSIS — R011 Cardiac murmur, unspecified: Secondary | ICD-10-CM | POA: Diagnosis not present

## 2020-01-02 DIAGNOSIS — M1990 Unspecified osteoarthritis, unspecified site: Secondary | ICD-10-CM | POA: Diagnosis not present

## 2020-01-02 DIAGNOSIS — R627 Adult failure to thrive: Secondary | ICD-10-CM | POA: Diagnosis not present

## 2020-01-02 DIAGNOSIS — K5790 Diverticulosis of intestine, part unspecified, without perforation or abscess without bleeding: Secondary | ICD-10-CM | POA: Diagnosis not present

## 2020-01-02 NOTE — Telephone Encounter (Signed)
Home Health Verbal Orders - Caller/Agency: Darral Dash [facility]  Callback Number: [phone] Requesting OT/PT/Skilled Nursing/Social Work/Speech Therapy: OT Frequency: 2w3  Darral Dash also wants feedback from previous Telephone encounter, esther believes UTI or dehydration is developing. Pt's son is still waiting for feedback on previous message.

## 2020-01-03 ENCOUNTER — Telehealth: Payer: Self-pay

## 2020-01-03 DIAGNOSIS — R627 Adult failure to thrive: Secondary | ICD-10-CM | POA: Diagnosis not present

## 2020-01-03 DIAGNOSIS — M1990 Unspecified osteoarthritis, unspecified site: Secondary | ICD-10-CM | POA: Diagnosis not present

## 2020-01-03 DIAGNOSIS — R011 Cardiac murmur, unspecified: Secondary | ICD-10-CM | POA: Diagnosis not present

## 2020-01-03 DIAGNOSIS — K5790 Diverticulosis of intestine, part unspecified, without perforation or abscess without bleeding: Secondary | ICD-10-CM | POA: Diagnosis not present

## 2020-01-03 DIAGNOSIS — I10 Essential (primary) hypertension: Secondary | ICD-10-CM | POA: Diagnosis not present

## 2020-01-03 DIAGNOSIS — F0390 Unspecified dementia without behavioral disturbance: Secondary | ICD-10-CM | POA: Diagnosis not present

## 2020-01-03 MED ORDER — CIPROFLOXACIN HCL 500 MG PO TABS: 500.0000 mg | ORAL_TABLET | Freq: Two times a day (BID) | ORAL | 0 refills | Status: DC

## 2020-01-03 NOTE — Telephone Encounter (Signed)
Ok for home health orders requested.  Have sent prescription for ciprofloxacin to cover for UTI to [facility].

## 2020-01-03 NOTE — Telephone Encounter (Signed)
I called and spoke with patient's son Maria Bush who reports that patient is doing much better today. Patient has not started the antibiotic yet, but Maria Bush plans to pick the prescription up tonight from the pharmacy. Maria Bush states he has been keeping patient hydrated and giving her fluids. Patient is now more alert and sitting upright. I advised Maria Bush to make sure she gets started on the antibiotic today. Maria Bush verbalized understanding.

## 2020-01-03 NOTE — Telephone Encounter (Signed)
Maria Bush with [facility] advised. Patient's son advised RX has been sent to pharmacy.

## 2020-01-03 NOTE — Telephone Encounter (Signed)
Copied from CRM [id number]. Topic: General - Other >> Jan 02, 2020  4:51 PM Marylen Ponto wrote: Reason for CRM: Darral Dash with [facility] called to report that there is a huge change with the patient. Darral Dash stated patient has some burning during urination but there is no odor as of yet. Darral Dash also stated since last Friday the patient has no lower body strength (unable to walk or sit up). Per Darral Dash, patient has limited interaction with decreased communication with family but increased sleeping.Darral Dash reports blood pressure of 92/51. Please contact patient son Ethelene Browns @ [phone] for further information. Esther contact # [phone]

## 2020-01-03 NOTE — Telephone Encounter (Signed)
Patient needs to go to ER to be evaluated.

## 2020-01-05 ENCOUNTER — Telehealth: Payer: Self-pay | Admitting: Family Medicine

## 2020-01-05 DIAGNOSIS — R011 Cardiac murmur, unspecified: Secondary | ICD-10-CM | POA: Diagnosis not present

## 2020-01-05 DIAGNOSIS — R627 Adult failure to thrive: Secondary | ICD-10-CM | POA: Diagnosis not present

## 2020-01-05 DIAGNOSIS — M1990 Unspecified osteoarthritis, unspecified site: Secondary | ICD-10-CM | POA: Diagnosis not present

## 2020-01-05 DIAGNOSIS — K5790 Diverticulosis of intestine, part unspecified, without perforation or abscess without bleeding: Secondary | ICD-10-CM | POA: Diagnosis not present

## 2020-01-05 DIAGNOSIS — I10 Essential (primary) hypertension: Secondary | ICD-10-CM | POA: Diagnosis not present

## 2020-01-05 DIAGNOSIS — F0390 Unspecified dementia without behavioral disturbance: Secondary | ICD-10-CM | POA: Diagnosis not present

## 2020-01-05 NOTE — Telephone Encounter (Signed)
That's fine

## 2020-01-05 NOTE — Telephone Encounter (Signed)
Esther with [facility] is calling in to request VO for home healtlh aid and social work for evaluation    Frequency: Home health 2 week 3    CB: [phone]

## 2020-01-05 NOTE — Telephone Encounter (Signed)
Verbal order given to [facility] with [facility]

## 2020-01-08 ENCOUNTER — Telehealth: Payer: Self-pay

## 2020-01-08 DIAGNOSIS — I10 Essential (primary) hypertension: Secondary | ICD-10-CM | POA: Diagnosis not present

## 2020-01-08 DIAGNOSIS — R627 Adult failure to thrive: Secondary | ICD-10-CM | POA: Diagnosis not present

## 2020-01-08 DIAGNOSIS — R011 Cardiac murmur, unspecified: Secondary | ICD-10-CM | POA: Diagnosis not present

## 2020-01-08 DIAGNOSIS — K5790 Diverticulosis of intestine, part unspecified, without perforation or abscess without bleeding: Secondary | ICD-10-CM | POA: Diagnosis not present

## 2020-01-08 DIAGNOSIS — M1990 Unspecified osteoarthritis, unspecified site: Secondary | ICD-10-CM | POA: Diagnosis not present

## 2020-01-08 DIAGNOSIS — F0390 Unspecified dementia without behavioral disturbance: Secondary | ICD-10-CM | POA: Diagnosis not present

## 2020-01-08 NOTE — Telephone Encounter (Signed)
If she has had any change in mental status or if not drinking fluids then she should go to ER.

## 2020-01-08 NOTE — Telephone Encounter (Signed)
Copied from CRM [id number]. Topic: General - Other >> Jan 08, 2020  3:38 PM [place], [place] D wrote: Reason for CRM: Pt has not eaten any food since Thursday evening and very limited fluids. Vitals are still within the norm but her BP is always just above 90.

## 2020-01-08 NOTE — Telephone Encounter (Signed)
Pt's son Ethelene Browns advised.  He states he will bring her to the ER to be evaluated.    Thanks,   -Vernona Rieger

## 2020-01-09 ENCOUNTER — Inpatient Hospital Stay
Admission: EM | Admit: 2020-01-09 | Discharge: 2020-01-13 | DRG: 640 | Disposition: A | Discharge status: Hospice | Payer: Medicare Other | Attending: Family Medicine | Admitting: Family Medicine

## 2020-01-09 ENCOUNTER — Encounter: Payer: Self-pay | Admitting: Intensive Care

## 2020-01-09 ENCOUNTER — Emergency Department: Payer: Medicare Other

## 2020-01-09 ENCOUNTER — Other Ambulatory Visit: Payer: Self-pay

## 2020-01-09 DIAGNOSIS — Z7189 Other specified counseling: Secondary | ICD-10-CM

## 2020-01-09 DIAGNOSIS — I10 Essential (primary) hypertension: Secondary | ICD-10-CM | POA: Diagnosis present

## 2020-01-09 DIAGNOSIS — D509 Iron deficiency anemia, unspecified: Secondary | ICD-10-CM | POA: Diagnosis present

## 2020-01-09 DIAGNOSIS — E860 Dehydration: Principal | ICD-10-CM | POA: Diagnosis present

## 2020-01-09 DIAGNOSIS — E870 Hyperosmolality and hypernatremia: Secondary | ICD-10-CM | POA: Diagnosis present

## 2020-01-09 DIAGNOSIS — Z66 Do not resuscitate: Secondary | ICD-10-CM | POA: Diagnosis present

## 2020-01-09 DIAGNOSIS — R000 Tachycardia, unspecified: Secondary | ICD-10-CM | POA: Diagnosis not present

## 2020-01-09 DIAGNOSIS — Z20822 Contact with and (suspected) exposure to covid-19: Secondary | ICD-10-CM | POA: Diagnosis not present

## 2020-01-09 DIAGNOSIS — R627 Adult failure to thrive: Secondary | ICD-10-CM | POA: Diagnosis present

## 2020-01-09 DIAGNOSIS — R531 Weakness: Secondary | ICD-10-CM

## 2020-01-09 DIAGNOSIS — Z8719 Personal history of other diseases of the digestive system: Secondary | ICD-10-CM

## 2020-01-09 DIAGNOSIS — J9601 Acute respiratory failure with hypoxia: Secondary | ICD-10-CM

## 2020-01-09 DIAGNOSIS — Z90710 Acquired absence of both cervix and uterus: Secondary | ICD-10-CM

## 2020-01-09 DIAGNOSIS — N179 Acute kidney failure, unspecified: Secondary | ICD-10-CM | POA: Diagnosis present

## 2020-01-09 DIAGNOSIS — R0902 Hypoxemia: Secondary | ICD-10-CM | POA: Diagnosis not present

## 2020-01-09 DIAGNOSIS — R638 Other symptoms and signs concerning food and fluid intake: Secondary | ICD-10-CM

## 2020-01-09 DIAGNOSIS — Z681 Body mass index (BMI) 19 or less, adult: Secondary | ICD-10-CM

## 2020-01-09 DIAGNOSIS — E43 Unspecified severe protein-calorie malnutrition: Secondary | ICD-10-CM | POA: Diagnosis not present

## 2020-01-09 DIAGNOSIS — E876 Hypokalemia: Secondary | ICD-10-CM | POA: Diagnosis present

## 2020-01-09 DIAGNOSIS — Z515 Encounter for palliative care: Secondary | ICD-10-CM | POA: Diagnosis not present

## 2020-01-09 DIAGNOSIS — Z7982 Long term (current) use of aspirin: Secondary | ICD-10-CM

## 2020-01-09 DIAGNOSIS — I959 Hypotension, unspecified: Secondary | ICD-10-CM | POA: Diagnosis not present

## 2020-01-09 DIAGNOSIS — F0390 Unspecified dementia without behavioral disturbance: Secondary | ICD-10-CM | POA: Diagnosis present

## 2020-01-09 DIAGNOSIS — E785 Hyperlipidemia, unspecified: Secondary | ICD-10-CM | POA: Diagnosis present

## 2020-01-09 DIAGNOSIS — Z7401 Bed confinement status: Secondary | ICD-10-CM

## 2020-01-09 DIAGNOSIS — Z79899 Other long term (current) drug therapy: Secondary | ICD-10-CM

## 2020-01-09 HISTORY — DX: Unspecified dementia, severe, without behavioral disturbance, psychotic disturbance, mood disturbance, and anxiety: F03.C0

## 2020-01-09 HISTORY — DX: Unspecified dementia without behavioral disturbance: F03.90

## 2020-01-09 LAB — CBC WITH DIFFERENTIAL/PLATELET
Abs Immature Granulocytes: 0.07 10*3/uL (ref 0.00–0.07)
Basophils Absolute: 0.1 10*3/uL (ref 0.0–0.1)
Basophils Relative: 1 %
Eosinophils Absolute: 0.1 10*3/uL (ref 0.0–0.5)
Eosinophils Relative: 1 %
HCT: 30.9 % — ABNORMAL LOW (ref 36.0–46.0)
Hemoglobin: 10 g/dL — ABNORMAL LOW (ref 12.0–15.0)
Immature Granulocytes: 1 %
Lymphocytes Relative: 14 %
Lymphs Abs: 1.5 10*3/uL (ref 0.7–4.0)
MCH: 33.2 pg (ref 26.0–34.0)
MCHC: 32.4 g/dL (ref 30.0–36.0)
MCV: 102.7 fL — ABNORMAL HIGH (ref 80.0–100.0)
Monocytes Absolute: 0.7 10*3/uL (ref 0.1–1.0)
Monocytes Relative: 6 %
Neutro Abs: 8.2 10*3/uL — ABNORMAL HIGH (ref 1.7–7.7)
Neutrophils Relative %: 77 %
Platelets: 429 10*3/uL — ABNORMAL HIGH (ref 150–400)
RBC: 3.01 MIL/uL — ABNORMAL LOW (ref 3.87–5.11)
RDW: 12.3 % (ref 11.5–15.5)
WBC: 10.5 10*3/uL (ref 4.0–10.5)
nRBC: 0 % (ref 0.0–0.2)

## 2020-01-09 LAB — COMPREHENSIVE METABOLIC PANEL
ALT: 10 U/L (ref 0–44)
AST: 20 U/L (ref 15–41)
Albumin: 3.6 g/dL (ref 3.5–5.0)
Alkaline Phosphatase: 54 U/L (ref 38–126)
Anion gap: 12 (ref 5–15)
BUN: 68 mg/dL — ABNORMAL HIGH (ref 8–23)
CO2: 29 mmol/L (ref 22–32)
Calcium: 9.3 mg/dL (ref 8.9–10.3)
Chloride: 99 mmol/L (ref 98–111)
Creatinine, Ser: 1.09 mg/dL — ABNORMAL HIGH (ref 0.44–1.00)
GFR calc Af Amer: 53 mL/min — ABNORMAL LOW (ref >60)
GFR calc non Af Amer: 46 mL/min — ABNORMAL LOW (ref >60)
Glucose, Bld: 122 mg/dL — ABNORMAL HIGH (ref 70–99)
Potassium: 4 mmol/L (ref 3.5–5.1)
Sodium: 140 mmol/L (ref 135–145)
Total Bilirubin: 0.7 mg/dL (ref 0.3–1.2)
Total Protein: 8.3 g/dL — ABNORMAL HIGH (ref 6.5–8.1)

## 2020-01-09 LAB — VITAMIN B12: Vitamin B-12: 711 pg/mL (ref 180–914)

## 2020-01-09 LAB — URINALYSIS, COMPLETE (UACMP) WITH MICROSCOPIC
Bacteria, UA: NONE SEEN
Bilirubin Urine: NEGATIVE
Glucose, UA: NEGATIVE mg/dL
Hgb urine dipstick: NEGATIVE
Ketones, ur: NEGATIVE mg/dL
Leukocytes,Ua: NEGATIVE
Nitrite: NEGATIVE
Protein, ur: NEGATIVE mg/dL
Specific Gravity, Urine: 1.015 (ref 1.005–1.030)
Squamous Epithelial / HPF: NONE SEEN (ref 0–5)
pH: 6 (ref 5.0–8.0)

## 2020-01-09 LAB — IRON AND TIBC
Iron: 24 ug/dL — ABNORMAL LOW (ref 28–170)
Saturation Ratios: 11 % (ref 10.4–31.8)
TIBC: 220 ug/dL — ABNORMAL LOW (ref 250–450)
UIBC: 196 ug/dL

## 2020-01-09 LAB — LACTIC ACID, PLASMA: Lactic Acid, Venous: 1.7 mmol/L (ref 0.5–1.9)

## 2020-01-09 LAB — TROPONIN I (HIGH SENSITIVITY): Troponin I (High Sensitivity): 16 ng/L (ref <18)

## 2020-01-09 LAB — POC SARS CORONAVIRUS 2 AG: SARS Coronavirus 2 Ag: NEGATIVE

## 2020-01-09 MED ORDER — ONDANSETRON HCL 4 MG PO TABS: 4.0000 mg | ORAL_TABLET | Freq: Four times a day (QID) | ORAL | Status: DC | PRN

## 2020-01-09 MED ORDER — DEXTROSE-NACL 5-0.9 % IV SOLN: INTRAVENOUS | Status: DC

## 2020-01-09 MED ORDER — LACTATED RINGERS IV SOLN: INTRAVENOUS | Status: DC

## 2020-01-09 MED ORDER — ENOXAPARIN SODIUM 40 MG/0.4ML ~~LOC~~ SOLN
30.0000 mg | SUBCUTANEOUS | Status: DC
  Administered 2020-01-10 (×2): 30 mg via SUBCUTANEOUS
  Filled 2020-01-09 (×3): qty 0.4

## 2020-01-09 MED ORDER — ACETAMINOPHEN 325 MG PO TABS: 650.0000 mg | ORAL_TABLET | Freq: Four times a day (QID) | ORAL | Status: DC | PRN

## 2020-01-09 MED ORDER — ONDANSETRON HCL 4 MG/2ML IJ SOLN: 4.0000 mg | Freq: Four times a day (QID) | INTRAMUSCULAR | Status: DC | PRN

## 2020-01-09 MED ORDER — LACTATED RINGERS IV BOLUS
500.0000 mL | Freq: Once | INTRAVENOUS | Status: AC
  Administered 2020-01-09: 500 mL via INTRAVENOUS

## 2020-01-09 MED ORDER — ACETAMINOPHEN 650 MG RE SUPP: 650.0000 mg | Freq: Four times a day (QID) | RECTAL | Status: DC | PRN

## 2020-01-09 MED ORDER — SENNOSIDES-DOCUSATE SODIUM 8.6-50 MG PO TABS: 1.0000 | ORAL_TABLET | Freq: Every evening | ORAL | Status: DC | PRN

## 2020-01-09 NOTE — ED Notes (Signed)
Lab contacted to add on troponin. Spoke with Marylene Land

## 2020-01-09 NOTE — ED Notes (Signed)
Maria Bush (son) POA at bedside

## 2020-01-09 NOTE — ED Provider Notes (Signed)
[facility] Emergency Department Provider Note  ____________________________________________   First MD Initiated Contact with Patient 01/09/20 1458     (approximate)  I have reviewed the triage vital signs and the nursing notes.   HISTORY  Chief Complaint Failure To Thrive    HPI Maria Bush is a 86 y.o. female  With PMHx dementia, HTN, HLD, here with failure to thrive. History provided primarily by son. Per report, pt had just returned home from a SNF stay approx 2 weeks ago. Initially she was doing fairly well but over the past week, has had progressively worsening appetite with poor PO intake, decreased UOP, and mild increased confusion. She ahs not wanted to eat or drink much of anything. She has subsequently become essentially bed bound due to weakness. She has c/o some mild indigestion but no vomiting or diarrhea. No fever or chills. No other complaints.       Past Medical History:  Diagnosis Date  . Acute blood loss anemia   . Advanced dementia (HCC)   . Arthritis 2005  . Diverticulosis   . GI bleed   . Heart murmur 2005  . Hypertension   . Memory loss     Patient Active Problem List   Diagnosis Date Noted  . History of GI bleed 01/09/2020  . Decreased oral intake 01/09/2020  . Failure to thrive in adult   . Elevated troponin 11/17/2019  . Acute renal failure (ARF) (HCC) 11/17/2019  . Generalized weakness   . Dementia without behavioral disturbance (HCC) 09/24/2019  . GIB (gastrointestinal bleeding) 03/30/2017  . Gastroesophageal reflux disease with esophagitis   . Acute post-hemorrhagic anemia 03/26/2017  . Bradycardia 03/26/2017  . Acute blood loss anemia 03/26/2017  . Diverticulosis   . GI bleed 03/25/2017  . Left shoulder pain 12/26/2015  . Edema 04/25/2015  . Shortness of breath 04/25/2015  . Unsteady gait 04/25/2015  . Allergic rhinitis 04/24/2015  . Arthropathia 04/24/2015  . B12 deficiency 04/24/2015  .  Atherosclerosis of coronary artery 04/24/2015  . DDD (degenerative disc disease), lumbar 04/24/2015  . Clinical depression 04/24/2015  . DD (diverticular disease) 04/24/2015  . Elevated CK 04/24/2015  . Atrial premature depolarization 04/24/2015  . Hypercholesteremia 04/24/2015  . BP (high blood pressure) 04/24/2015  . Anemia, iron deficiency 04/24/2015  . 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 Lower esophageal ring 07/12/2014  . 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 Urge incontinence of urine 09/01/2013  . Incomplete emptying of bladder 09/01/2013  . Other symptoms involving urinary system(788.99) 09/01/2013  . Family history of malignant neoplasm of breast 02/10/2013  . Diffuse cystic mastopathy 02/10/2013  . Arthritis     Past Surgical History:  Procedure Laterality Date  . ABDOMINAL HYSTERECTOMY  1981  . BLADDER SURGERY  1992  . BREAST BIOPSY  1992  . CARDIAC CATHETERIZATION  2011  . CATARACT EXTRACTION Bilateral 2015  . CHOLECYSTECTOMY  08/09/2000  . COLONOSCOPY  1982  . DILATION AND CURETTAGE OF UTERUS  1979  . GIVENS CAPSULE STUDY N/A 03/27/2017   Procedure: GIVENS CAPSULE STUDY;  Surgeon: Bernette Redbird, MD;  Location: [facility] ENDOSCOPY;  Service: Endoscopy;  Laterality: N/A;  . RECTOCELE REPAIR  1994    Prior to Admission medications   Medication Sig Start Date End Date Taking? Authorizing Provider  aspirin 81 MG EC tablet Take 81 mg by mouth daily. 09/01/12  Yes [provider]  atenolol (TENORMIN) 25 MG tablet TAKE 1 TABLET BY  MOUTH DAILY Patient taking differently: Take 25 mg by mouth daily.  08/04/19  Yes Birdie Sons, MD  Calcium Carbonate-Vitamin D3 600-400 MG-UNIT TABS Take 1 tablet by mouth daily. 10/31/18  Yes Birdie Sons, MD  chlorthalidone (HYGROTON) 25 MG tablet Take 25 mg by mouth daily.   Yes [provider]  ciprofloxacin (CIPRO) 500 MG tablet Take 1 tablet (500 mg total) by mouth 2 (two)  times daily for 7 days. 01/03/20 01/10/20 Yes Birdie Sons, MD  donepezil (ARICEPT) 5 MG tablet Take 5 mg by mouth daily. 01/01/20  Yes [provider]  FEROSUL 325 (65 Fe) MG tablet TAKE 1 TABLET(325 MG) BY MOUTH DAILY Patient taking differently: Take 325 mg by mouth daily.  11/16/19  Yes Birdie Sons, MD  furosemide (LASIX) 20 MG tablet Take 20 mg by mouth daily as needed for fluid.   Yes [provider]  latanoprost (XALATAN) 0.005 % ophthalmic solution Place 1 drop into both eyes at bedtime.   Yes [provider]  lisinopril (PRINIVIL,ZESTRIL) 20 MG tablet Take 1 tablet (20 mg total) by mouth daily. 01/23/19  Yes Birdie Sons, MD  loratadine (CLARITIN) 10 MG tablet Take 10 mg by mouth daily as needed for allergies.    Yes [provider]  Travoprost, BAK Free, (TRAVATAN Z) 0.004 % SOLN ophthalmic solution Place 1 drop into both eyes at bedtime.    Yes [provider]    Allergies Cephalexin, Aricept [donepezil], Prednisone, Sulfa antibiotics, and Penicillins  Family History  Problem Relation Age of Onset  . Breast cancer Sister   . Early death Mother   . Stroke Father   . Heart disease Brother   . Heart attack Brother   . Heart disease Brother   . Heart attack Brother     Social History Social History   Tobacco Use  . Smoking status: Never Smoker  . Smokeless tobacco: Never Used  Substance Use Topics  . Alcohol use: No  . Drug use: No    Review of Systems  Review of Systems  Unable to perform ROS: Dementia  Constitutional: Positive for activity change, appetite change and fatigue.  Neurological: Positive for weakness.     ____________________________________________  PHYSICAL EXAM:      VITAL SIGNS: ED Triage Vitals [01/09/20 1509]  Enc Vitals Group     BP (!) 109/95     Pulse Rate 75     Resp 18     Temp 98.2 F (36.8 C)     Temp Source Axillary     SpO2 98 %     Weight 110 lb (49.9 kg)     Height 5\' 3"   (1.6 m)     Head Circumference      Peak Flow      Pain Score      Pain Loc      Pain Edu?      Excl. in [place]?      Physical Exam Vitals and nursing note reviewed.  Constitutional:      General: She is not in acute distress.    Appearance: She is well-developed.     Comments: Cachectic, frail appearing  HENT:     Head: Normocephalic and atraumatic.     Comments: Pooling of secretions noted in oropharynx    Mouth/Throat:     Mouth: Mucous membranes are dry.     Comments: Markedly dry MM Eyes:     Conjunctiva/sclera: Conjunctivae normal.  Cardiovascular:  Rate and Rhythm: Normal rate and regular rhythm.     Heart sounds: Normal heart sounds. No murmur. No friction rub.  Pulmonary:     Effort: Pulmonary effort is normal. No respiratory distress.     Breath sounds: Normal breath sounds. No wheezing or rales.  Abdominal:     General: There is no distension.     Palpations: Abdomen is soft.     Tenderness: There is no abdominal tenderness.  Musculoskeletal:     Cervical back: Neck supple.  Skin:    General: Skin is warm.     Capillary Refill: Capillary refill takes 2 to 3 seconds.     Comments: Poor skin turgor noted  Neurological:     Mental Status: She is alert. She is disoriented.     Motor: No abnormal muscle tone.       ____________________________________________   LABS (all labs ordered are listed, but only abnormal results are displayed)  Labs Reviewed  CBC WITH DIFFERENTIAL/PLATELET - Abnormal; Notable for the following components:      Result Value   RBC 3.01 (*)    Hemoglobin 10.0 (*)    HCT 30.9 (*)    MCV 102.7 (*)    Platelets 429 (*)    Neutro Abs 8.2 (*)    All other components within normal limits  COMPREHENSIVE METABOLIC PANEL - Abnormal; Notable for the following components:   Glucose, Bld 122 (*)    BUN 68 (*)    Creatinine, Ser 1.09 (*)    Total Protein 8.3 (*)    GFR calc non Af Amer 46 (*)    GFR calc Af Amer 53 (*)    All other  components within normal limits  URINALYSIS, COMPLETE (UACMP) WITH MICROSCOPIC - Abnormal; Notable for the following components:   Color, Urine YELLOW (*)    APPearance HAZY (*)    All other components within normal limits  IRON AND TIBC - Abnormal; Notable for the following components:   Iron 24 (*)    TIBC 220 (*)    All other components within normal limits  SARS CORONAVIRUS 2 (TAT 6-24 HRS)  LACTIC ACID, PLASMA  BASIC METABOLIC PANEL  CBC  VITAMIN B12  POC SARS CORONAVIRUS 2 AG -  ED  POC SARS CORONAVIRUS 2 AG  TROPONIN I (HIGH SENSITIVITY)    ____________________________________________  EKG: Rhythm somewhat difficult to interpret, but suspect sinus rhythm.  Ventricular rate 71.  QRS 1 9, QTc 456.  No overt ST elevations or depressions.  Moderate baseline artifact due to shaking/tremors. ________________________________________  RADIOLOGY All imaging, including plain films, CT scans, and ultrasounds, independently reviewed by me, and interpretations confirmed via formal radiology reads.  ED MD interpretation:   CXR: Clear  Official radiology report(s): DG Chest Portable 1 View  Result Date: 01/09/2020 CLINICAL DATA:  Weakness EXAM: PORTABLE CHEST 1 VIEW COMPARISON:  11/17/2019 FINDINGS: The heart size and mediastinal contours are stable. No new focal airspace consolidation, pleural effusion, or pneumothorax. Advanced degenerative changes of the left shoulder. IMPRESSION: Stable exam.  No active disease. Electronically Signed   By: Duanne Guess D.O.   On: 01/09/2020 16:25    ____________________________________________  PROCEDURES   Procedure(s) performed (including Critical Care):  Procedures  ____________________________________________  INITIAL IMPRESSION / MDM / ASSESSMENT AND PLAN / ED COURSE  As part of my medical decision making, I reviewed the following data within the electronic MEDICAL RECORD NUMBER Nursing notes reviewed and incorporated, Old chart  reviewed, Notes from  prior ED visits, and [place] Controlled Substance Database       *MAKAYLYN SINYARD was evaluated in Emergency Department on 01/09/2020 for the symptoms described in the history of present illness. She was evaluated in the context of the global COVID-19 pandemic, which necessitated consideration that the patient might be at risk for infection with the SARS-CoV-2 virus that causes COVID-19. Institutional protocols and algorithms that pertain to the evaluation of patients at risk for COVID-19 are in a state of rapid change based on information released by regulatory bodies including the CDC and federal and state organizations. These policies and algorithms were followed during the patient's care in the ED.  Some ED evaluations and interventions may be delayed as a result of limited staffing during the pandemic.*     Medical Decision Making: 86 year old female here with generalized weakness and decreased p.o. intake.  Unfortunately, suspect some of this is due to progression of her dementia as well as recent illness with subsequent deconditioning.  She appears dehydrated clinically and lab work shows elevated BUN consistent with this.  Otherwise, no evidence of UTI, pneumonia, or significant electrolyte abnormality.  Her renal function is normal.  Will start fluids, reassess.  Pt noted to be hypoxic in ED. Visualized to aspirate with attempted swallowing. Suspect aspiration pneumonitis with resp failure. Admit.  ____________________________________________  FINAL CLINICAL IMPRESSION(S) / ED DIAGNOSES  Final diagnoses:  Acute respiratory failure with hypoxia (HCC)  Dehydration     MEDICATIONS GIVEN DURING THIS VISIT:  Medications  lactated ringers infusion ( Intravenous New Bag/Given 01/09/20 2001)  enoxaparin (LOVENOX) injection 30 mg (has no administration in time range)  dextrose 5 %-0.9 % sodium chloride infusion (has no administration in time range)  acetaminophen  (TYLENOL) tablet 650 mg (has no administration in time range)    Or  acetaminophen (TYLENOL) suppository 650 mg (has no administration in time range)  senna-docusate (Senokot-S) tablet 1 tablet (has no administration in time range)  ondansetron (ZOFRAN) tablet 4 mg (has no administration in time range)    Or  ondansetron (ZOFRAN) injection 4 mg (has no administration in time range)  lactated ringers bolus 500 mL (0 mLs Intravenous Stopped 01/09/20 1815)  lactated ringers bolus 500 mL (0 mLs Intravenous Stopped 01/09/20 1816)     ED Discharge Orders    None       Note:  This document was prepared using Dragon voice recognition software and may include unintentional dictation errors.   Shaune Pollack, MD 01/09/20 2308

## 2020-01-09 NOTE — H&P (Signed)
History and Physical    Maria Bush YQM:[id number] DOB: [birth year] DOA: 01/09/2020  PCP: Malva Limes, MD   Patient coming from: home I have personally briefly reviewed patient's old medical records in [facility]  Chief Complaint: Weakness, not eating HPI: Maria Bush is a 86 y.o. female with medical history significant for dementia, adult failure to thrive, hypertension, hospitalized from 11/16/2019 to 11/21/2019 with weakness and dehydration and discharged to nursing home where she completed 3 weeks of therapy, returning home a week prior who was brought back to the emergency room with persistent weakness, not eating, not ambulating, staying in bed.  As of history is taken from ER notes as patient is unable to contribute to history.  Son was at bedside earlier.  No reports of cough, shortness of breath fever or chills.  No nausea, vomiting abdominal pain change in bowel habits or dysuria.  Reports of chest pain  ED Course: On arrival in the emergency room she was afebrile with blood pressure 109/95 falling to as low as 90/77 improving with IV fluid bolus, heart rate 75, O2 sat 98% on room air.  On her blood work she had white cell count of 10,500, hemoglobin of 10, down from 12 on 11/17/2019.  Creatinine 1.09 up from her baseline of 0.84.  Urinalysis unremarkable, chest x-ray with no acute disease.  Patient was started on IV hydration from the emergency room.  Hospitalist consulted for admission for dehydration and adult failure to thrive..  Review of Systems: Patient unable to participate due to advanced dementia  Past Medical History:  Diagnosis Date  . Acute blood loss anemia   . Advanced dementia (HCC)   . Arthritis 2005  . Diverticulosis   . GI bleed   . Heart murmur 2005  . Hypertension   . Memory loss     Past Surgical History:  Procedure Laterality Date  . ABDOMINAL HYSTERECTOMY  1981  . BLADDER SURGERY  1992  . BREAST BIOPSY  1992  . CARDIAC  CATHETERIZATION  2011  . CATARACT EXTRACTION Bilateral 2015  . CHOLECYSTECTOMY  08/09/2000  . COLONOSCOPY  1982  . DILATION AND CURETTAGE OF UTERUS  1979  . GIVENS CAPSULE STUDY N/A 03/27/2017   Procedure: GIVENS CAPSULE STUDY;  Surgeon: Bernette Redbird, MD;  Location: [facility] ENDOSCOPY;  Service: Endoscopy;  Laterality: N/A;  . RECTOCELE REPAIR  1994     reports that she has never smoked. She has never used smokeless tobacco. She reports that she does not drink alcohol or use drugs.  Allergies  Allergen Reactions  . Cephalexin Shortness Of Breath  . Aricept [Donepezil]     Hallucinations   . Prednisone Nausea And Vomiting  . Sulfa Antibiotics Itching  . Penicillins Rash    Family History  Problem Relation Age of Onset  . Breast cancer Sister   . Early death Mother   . Stroke Father   . Heart disease Brother   . Heart attack Brother   . Heart disease Brother   . Heart attack Brother      Prior to Admission medications   Medication Sig Start Date End Date Taking? Authorizing Provider  aspirin 81 MG EC tablet Take 81 mg by mouth daily. 09/01/12  Yes [provider]  atenolol (TENORMIN) 25 MG tablet TAKE 1 TABLET BY MOUTH DAILY Patient taking differently: Take 25 mg by mouth daily.  08/04/19  Yes Malva Limes, MD  Calcium Carbonate-Vitamin D3 600-400 MG-UNIT TABS Take 1  tablet by mouth daily. 10/31/18  Yes Malva Limes, MD  chlorthalidone (HYGROTON) 25 MG tablet Take 25 mg by mouth daily.   Yes [provider]  ciprofloxacin (CIPRO) 500 MG tablet Take 1 tablet (500 mg total) by mouth 2 (two) times daily for 7 days. 01/03/20 01/10/20 Yes Malva Limes, MD  donepezil (ARICEPT) 5 MG tablet Take 5 mg by mouth daily. 01/01/20  Yes [provider]  FEROSUL 325 (65 Fe) MG tablet TAKE 1 TABLET(325 MG) BY MOUTH DAILY Patient taking differently: Take 325 mg by mouth daily.  11/16/19  Yes Malva Limes, MD  furosemide (LASIX) 20 MG tablet Take 20 mg by  mouth daily as needed for fluid.   Yes [provider]  latanoprost (XALATAN) 0.005 % ophthalmic solution Place 1 drop into both eyes at bedtime.   Yes [provider]  lisinopril (PRINIVIL,ZESTRIL) 20 MG tablet Take 1 tablet (20 mg total) by mouth daily. 01/23/19  Yes Malva Limes, MD  loratadine (CLARITIN) 10 MG tablet Take 10 mg by mouth daily as needed for allergies.    Yes [provider]  Travoprost, BAK Free, (TRAVATAN Z) 0.004 % SOLN ophthalmic solution Place 1 drop into both eyes at bedtime.    Yes [provider]    Physical Exam: Vitals:   01/09/20 1630 01/09/20 1700 01/09/20 1807 01/09/20 1839  BP: (!) 100/59 111/75  124/68  Pulse:   75 90  Resp:   18 19  Temp:      TempSrc:      SpO2:   94% 95%  Weight:      Height:         Vitals:   01/09/20 1630 01/09/20 1700 01/09/20 1807 01/09/20 1839  BP: (!) 100/59 111/75  124/68  Pulse:   75 90  Resp:   18 19  Temp:      TempSrc:      SpO2:   94% 95%  Weight:      Height:        Constitutional: Patient resting comfortably, no acute distress.  Cachectic appearance Eyes: PERRL, lids and conjunctivae normal ENMT: Mucous membranes are moist.  Neck: normal, supple, no masses, no thyromegaly Respiratory: clear to auscultation bilaterally, no wheezing, no crackles. Normal respiratory effort. No accessory muscle use.  Cardiovascular: Regular rate and rhythm, no murmurs / rubs / gallops. No extremity edema. 2+ pedal pulses. No carotid bruits.  Abdomen: no tenderness, no masses palpated. No hepatosplenomegaly. Bowel sounds positive.  Musculoskeletal: no clubbing / cyanosis. No joint deformity upper and lower extremities.  Skin: no rashes, lesions, ulcers.  Neurologic: No gross focal neurologic deficit. Psychiatric: Difficult to assess because of dementia   Labs on Admission: I have personally reviewed following labs and imaging studies  CBC: Recent Labs  Lab 01/09/20 1559  WBC 10.5    NEUTROABS 8.2*  HGB 10.0*  HCT 30.9*  MCV 102.7*  PLT 429*   Basic Metabolic Panel: Recent Labs  Lab 01/09/20 1559  NA 140  K 4.0  CL 99  CO2 29  GLUCOSE 122*  BUN 68*  CREATININE 1.09*  CALCIUM 9.3   GFR: Estimated Creatinine Clearance: 28.6 mL/min (A) (by C-G formula based on SCr of 1.09 mg/dL (H)). Liver Function Tests: Recent Labs  Lab 01/09/20 1559  AST 20  ALT 10  ALKPHOS 54  BILITOT 0.7  PROT 8.3*  ALBUMIN 3.6   No results for input(s): LIPASE, AMYLASE in the last 168 hours.  No results for input(s): AMMONIA in the last 168 hours. Coagulation Profile: No results for input(s): INR, PROTIME in the last 168 hours. Cardiac Enzymes: No results for input(s): CKTOTAL, CKMB, CKMBINDEX, TROPONINI in the last 168 hours. BNP (last 3 results) No results for input(s): PROBNP in the last 8760 hours. HbA1C: No results for input(s): HGBA1C in the last 72 hours. CBG: No results for input(s): GLUCAP in the last 168 hours. Lipid Profile: No results for input(s): CHOL, HDL, LDLCALC, TRIG, CHOLHDL, LDLDIRECT in the last 72 hours. Thyroid Function Tests: No results for input(s): TSH, T4TOTAL, FREET4, T3FREE, THYROIDAB in the last 72 hours. Anemia Panel: No results for input(s): VITAMINB12, FOLATE, FERRITIN, TIBC, IRON, RETICCTPCT in the last 72 hours. Urine analysis:    Component Value Date/Time   COLORURINE YELLOW (A) 01/09/2020 1600   APPEARANCEUR HAZY (A) 01/09/2020 1600   APPEARANCEUR Cloudy (A) 10/02/2019 1504   LABSPEC 1.015 01/09/2020 1600   LABSPEC 1.006 08/14/2014 2204   PHURINE 6.0 01/09/2020 1600   GLUCOSEU NEGATIVE 01/09/2020 1600   GLUCOSEU Negative 08/14/2014 2204   HGBUR NEGATIVE 01/09/2020 1600   BILIRUBINUR NEGATIVE 01/09/2020 1600   BILIRUBINUR Negative 10/02/2019 1504   BILIRUBINUR Negative 08/14/2014 2204   KETONESUR NEGATIVE 01/09/2020 1600   PROTEINUR NEGATIVE 01/09/2020 1600   UROBILINOGEN 0.2 09/10/2017 1636   NITRITE NEGATIVE 01/09/2020  1600   LEUKOCYTESUR NEGATIVE 01/09/2020 1600   LEUKOCYTESUR Negative 08/14/2014 2204    Radiological Exams on Admission: DG Chest Portable 1 View  Result Date: 01/09/2020 CLINICAL DATA:  Weakness EXAM: PORTABLE CHEST 1 VIEW COMPARISON:  11/17/2019 FINDINGS: The heart size and mediastinal contours are stable. No new focal airspace consolidation, pleural effusion, or pneumothorax. Advanced degenerative changes of the left shoulder. IMPRESSION: Stable exam.  No active disease. Electronically Signed   By: Davina Poke D.O.   On: 01/09/2020 16:25    EKG: Independently reviewed.   Assessment/Plan Principal Problem:   Generalized weakness:   Decreased oral intake   Failure to thrive in adult -Patient recently discharged from nursing home, now lethargic, not eating not attempting to ambulate -Possibly related to dehydration from poor oral intake superimposed on may be progression of her dementia -Nutritionist and speech therapy evaluations.  Case management consult    Anemia, iron deficiency  History of GI bleed -Hemoglobin 10, down from 12 on November 17, 2019 -Stool for occult blood, iron and vitamin B12 levels Monitor H/H    Dementia without behavioral disturbance ([place]) -Continue home Aricept  Essential hypertension -Blood pressure was soft on arrival at 109/95 -Hold lisinopril, furosemide, chlorthalidone and atenolol and resume as appropriate     Acute renal failure (ARF) ([place]) -Likely prerenal related to poor oral intake, in combination with diuretic medication of Lasix and chlorthalidone -IV hydration -Continue to monitor renal function   DVT prophylaxis: lovenox  Code Status: full code  Family Communication: none  Disposition Plan: Back to previous home environment Consults called: none     Athena Masse MD [facility]     01/09/2020, 7:36 PM

## 2020-01-09 NOTE — ED Triage Notes (Signed)
Patient recently brought home rehab. Son reports she quit ambulating with walker about a week or so ago and now bed bound. Son also reports she has not been eating/drinking like normal the past week also. HX advanced dementia. Patient has home health care and in process of getting a caregiver to come daily. Recently got hospital bed in home recently.

## 2020-01-10 DIAGNOSIS — Z66 Do not resuscitate: Secondary | ICD-10-CM | POA: Diagnosis present

## 2020-01-10 DIAGNOSIS — D509 Iron deficiency anemia, unspecified: Secondary | ICD-10-CM | POA: Diagnosis present

## 2020-01-10 DIAGNOSIS — E43 Unspecified severe protein-calorie malnutrition: Secondary | ICD-10-CM | POA: Diagnosis present

## 2020-01-10 DIAGNOSIS — F0390 Unspecified dementia without behavioral disturbance: Secondary | ICD-10-CM | POA: Diagnosis present

## 2020-01-10 DIAGNOSIS — Z79899 Other long term (current) drug therapy: Secondary | ICD-10-CM | POA: Diagnosis not present

## 2020-01-10 DIAGNOSIS — Z7401 Bed confinement status: Secondary | ICD-10-CM | POA: Diagnosis not present

## 2020-01-10 DIAGNOSIS — Z7189 Other specified counseling: Secondary | ICD-10-CM | POA: Diagnosis not present

## 2020-01-10 DIAGNOSIS — R531 Weakness: Secondary | ICD-10-CM | POA: Diagnosis not present

## 2020-01-10 DIAGNOSIS — E870 Hyperosmolality and hypernatremia: Secondary | ICD-10-CM | POA: Diagnosis present

## 2020-01-10 DIAGNOSIS — R627 Adult failure to thrive: Secondary | ICD-10-CM | POA: Diagnosis present

## 2020-01-10 DIAGNOSIS — R4182 Altered mental status, unspecified: Secondary | ICD-10-CM | POA: Diagnosis not present

## 2020-01-10 DIAGNOSIS — Z515 Encounter for palliative care: Secondary | ICD-10-CM | POA: Diagnosis present

## 2020-01-10 DIAGNOSIS — M2550 Pain in unspecified joint: Secondary | ICD-10-CM | POA: Diagnosis not present

## 2020-01-10 DIAGNOSIS — R410 Disorientation, unspecified: Secondary | ICD-10-CM | POA: Diagnosis not present

## 2020-01-10 DIAGNOSIS — Z7982 Long term (current) use of aspirin: Secondary | ICD-10-CM | POA: Diagnosis not present

## 2020-01-10 DIAGNOSIS — E860 Dehydration: Secondary | ICD-10-CM | POA: Diagnosis present

## 2020-01-10 DIAGNOSIS — Z20822 Contact with and (suspected) exposure to covid-19: Secondary | ICD-10-CM | POA: Diagnosis present

## 2020-01-10 DIAGNOSIS — E876 Hypokalemia: Secondary | ICD-10-CM | POA: Diagnosis present

## 2020-01-10 DIAGNOSIS — Z681 Body mass index (BMI) 19 or less, adult: Secondary | ICD-10-CM | POA: Diagnosis not present

## 2020-01-10 DIAGNOSIS — I10 Essential (primary) hypertension: Secondary | ICD-10-CM | POA: Diagnosis present

## 2020-01-10 DIAGNOSIS — Z90710 Acquired absence of both cervix and uterus: Secondary | ICD-10-CM | POA: Diagnosis not present

## 2020-01-10 DIAGNOSIS — R52 Pain, unspecified: Secondary | ICD-10-CM | POA: Diagnosis not present

## 2020-01-10 DIAGNOSIS — E785 Hyperlipidemia, unspecified: Secondary | ICD-10-CM | POA: Diagnosis present

## 2020-01-10 LAB — BASIC METABOLIC PANEL
Anion gap: 13 (ref 5–15)
BUN: 57 mg/dL — ABNORMAL HIGH (ref 8–23)
CO2: 26 mmol/L (ref 22–32)
Calcium: 9 mg/dL (ref 8.9–10.3)
Chloride: 107 mmol/L (ref 98–111)
Creatinine, Ser: 0.95 mg/dL (ref 0.44–1.00)
GFR calc Af Amer: >60 mL/min (ref >60)
GFR calc non Af Amer: 54 mL/min — ABNORMAL LOW (ref >60)
Glucose, Bld: 136 mg/dL — ABNORMAL HIGH (ref 70–99)
Potassium: 3.3 mmol/L — ABNORMAL LOW (ref 3.5–5.1)
Sodium: 146 mmol/L — ABNORMAL HIGH (ref 135–145)

## 2020-01-10 LAB — CBC
HCT: 30.2 % — ABNORMAL LOW (ref 36.0–46.0)
Hemoglobin: 9.6 g/dL — ABNORMAL LOW (ref 12.0–15.0)
MCH: 32.7 pg (ref 26.0–34.0)
MCHC: 31.8 g/dL (ref 30.0–36.0)
MCV: 102.7 fL — ABNORMAL HIGH (ref 80.0–100.0)
Platelets: 367 10*3/uL (ref 150–400)
RBC: 2.94 MIL/uL — ABNORMAL LOW (ref 3.87–5.11)
RDW: 12.2 % (ref 11.5–15.5)
WBC: 9.5 10*3/uL (ref 4.0–10.5)
nRBC: 0 % (ref 0.0–0.2)

## 2020-01-10 LAB — SARS CORONAVIRUS 2 BY RT PCR (DIASORIN): SARS Coronavirus 2: NEGATIVE

## 2020-01-10 MED ORDER — ENSURE ENLIVE PO LIQD
237.0000 mL | Freq: Two times a day (BID) | ORAL | Status: DC
  Administered 2020-01-10 – 2020-01-11 (×3): 237 mL via ORAL

## 2020-01-10 MED ORDER — KCL IN DEXTROSE-NACL 20-5-0.45 MEQ/L-%-% IV SOLN
INTRAVENOUS | Status: DC
  Filled 2020-01-10 (×5): qty 1000

## 2020-01-10 MED ORDER — LATANOPROST 0.005 % OP SOLN
1.0000 [drp] | Freq: Every day | OPHTHALMIC | Status: DC
  Administered 2020-01-10 – 2020-01-12 (×3): 1 [drp] via OPHTHALMIC
  Filled 2020-01-10: qty 2.5

## 2020-01-10 NOTE — Plan of Care (Signed)

## 2020-01-10 NOTE — Consult Note (Signed)
Consultation Note Date: 01/10/2020   Patient Name: Maria Bush  DOB: [birth year]  MRN: [id number]  Age / Sex: 86 y.o., female  PCP: Birdie Sons, MD Referring Physician: Edwin Dada, *  Reason for Consultation: Establishing goals of care  HPI/Patient Profile: 86 y.o. female  with past medical history of dementia, HLD, and HTN admitted on 01/09/2020 with failure to thrive - not walking for a week, decreased PO intake. Recently admitted for dehydration - went to rehab, returned home with reoccurrence. PMT consulted for [place].  Clinical Assessment and Goals of Care: I have reviewed medical records including EPIC notes, labs and imaging, assessed the patient and then met with patient's son, Elberta Fortis, to discuss diagnosis prognosis, GOC, EOL wishes, disposition and options.  I introduced Palliative Medicine as specialized medical care for people living with serious illness. It focuses on providing relief from the symptoms and stress of a serious illness. The goal is to improve quality of life for both the patient and the family.  We discussed a brief life review of the patient.  Son tells me patient has always lived in [place]. She has 2 children, Elberta Fortis and Levada Dy. Elberta Fortis is a devoted son - has went to extreme lengths to keep his mother safe and comfortable at home. He has moved in with her to help take care of her.   As far as functional and nutritional status, Elberta Fortis speaks of a decline. He tells me she has been nonambulatory - he has gotten a hospital bed and chair lift in the home. He also speaks of her declining appetite. He tells me she is mostly nonverbal.   We discussed her current illness and what it means in the larger context of her on-going co-morbidities.  Natural disease trajectory and expectations at EOL were discussed. We discussed patient's dementia and failure to thrive related to her advanced dementia. Discussed  concern that this will continue and worsen. Discussed natural disease progression of dementia.   I attempted to elicit values and goals of care important to the patient. Elberta Fortis tells me the patient wants to be at home and he wants to support that.   The difference between aggressive medical intervention and comfort care was considered in light of the patient's goals of care.   Advance directives, concepts specific to code status, artifical feeding and hydration, and rehospitalization were considered and discussed. We discussed full code vs DNR. DNR was recommended in light of patient's advanced dementia and frailty. Son asks for time to consider this and we plan to discuss this again with his sister during meeting tomorrow.   Hospice and Palliative Care services outpatient were explained and offered. We discussed palliative care support in the home vs hospice support in the home.   Questions and concerns were addressed. The family was encouraged to call with questions or concerns.   Primary Decision Maker HCPOA 1. Elberta Fortis 2. [place] planned for 3 PM tomorrow with son and daughter - sill discuss different support available at home and readdress code status  Code Status/Advance Care Planning:  Full code  Prognosis:   ~months - depends on PO intake  Discharge Planning: To Be Determined      Primary Diagnoses: Present on Admission: . Failure to thrive in adult . Acute renal failure (ARF) ([place]) . Dementia without behavioral disturbance ([place]) . Anemia, iron deficiency   I have reviewed the medical record, interviewed the patient and family, and  examined the patient. The following aspects are pertinent.  Past Medical History:  Diagnosis Date  . Acute blood loss anemia   . Advanced dementia ([place])   . Arthritis 2005  . Diverticulosis   . GI bleed   . Heart murmur 2005  . Hypertension   . Memory loss    Social History    Socioeconomic History  . Marital status: Widowed    Spouse name: Not on file  . Number of children: 2  . Years of education: HS  . Highest education level: High school graduate  Occupational History  . 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 Smoking status: Never Smoker  . Smokeless tobacco: Never Used  Substance and Sexual Activity  . Alcohol use: No  . Drug use: No  . Sexual activity: Not on file  Other Topics Concern  . Not on file  Social History Narrative   Pt lives in [place]. Her daughter lives with her, with 3 grandchildren 36, 64, 32. Her daughter is a Gaffer. Pt takes care of her grandchildren at home.    Social Determinants of Health   Financial Resource Strain:   . Difficulty of Paying Living Expenses: Not on file  Food Insecurity:   . Worried About Charity fundraiser in the Last Year: Not on file  . Ran Out of Food in the Last Year: Not on file  Transportation Needs:   . Lack of Transportation (Medical): Not on file  . Lack of Transportation (Non-Medical): Not on file  Physical Activity: Inactive  . Days of Exercise per Week: 0 days  . Minutes of Exercise per Session: 0 min  Stress:   . Feeling of Stress : Not on file  Social Connections: Unknown  . Frequency of Communication with Friends and Family: Patient refused  . Frequency of Social Gatherings with Friends and Family: Patient refused  . Attends Religious Services: Patient refused  . Active Member of Clubs or Organizations: Patient refused  . Attends Archivist Meetings: Patient refused  . Marital Status: Patient refused   Family History  Problem Relation Age of Onset  . Breast cancer Sister   . Early death Mother   . Stroke Father   . Heart disease Brother   . Heart attack Brother   . Heart disease Brother   . Heart attack Brother    Scheduled Meds: . enoxaparin (LOVENOX) injection  30 mg Subcutaneous Q24H  . feeding supplement (ENSURE ENLIVE)  237  mL Oral BID BM   Continuous Infusions: . dextrose 5 % and 0.45 % NaCl with KCl 20 mEq/L 100 mL/hr at 01/10/20 0957  . lactated ringers Stopped (01/09/20 2100)   PRN Meds:.acetaminophen **OR** acetaminophen, ondansetron **OR** ondansetron (ZOFRAN) IV, senna-docusate Allergies  Allergen Reactions  . Cephalexin Shortness Of Breath  . Aricept [Donepezil]     Hallucinations   . Prednisone Nausea And Vomiting  . Sulfa Antibiotics Itching  . Penicillins Rash   Review of Systems  Unable to perform ROS: Dementia    Physical Exam Constitutional:      General: She is not in acute distress.    Appearance: She is underweight.  HENT:     Head: Normocephalic and atraumatic.  Cardiovascular:     Rate and Rhythm: Normal rate and regular rhythm.  Pulmonary:     Effort: Pulmonary effort is normal.     Breath sounds: Normal breath sounds.  Musculoskeletal:     Right  lower leg: No edema.     Left lower leg: No edema.  Skin:    General: Skin is warm and dry.  Neurological:     Mental Status: She is easily aroused. She is disoriented.     Vital Signs: BP 124/61 (BP Location: Right Arm)   Pulse 76   Temp 98 F (36.7 C) (Oral)   Resp 17   Ht '5\' 3"'$  (1.6 m)   Wt 49.9 kg   SpO2 94%   BMI 19.49 kg/m  Pain Scale: Faces POSS *See Group Information*: S-Acceptable,Sleep, easy to arouse Pain Score: 0-No pain   SpO2: SpO2: 94 % O2 Device:SpO2: 94 % O2 Flow Rate: .O2 Flow Rate (L/min): 2 L/min  IO: Intake/output summary:   Intake/Output Summary (Last 24 hours) at 01/10/2020 1550 Last data filed at 01/10/2020 1500 Gross per 24 hour  Intake 3289.48 ml  Output 300 ml  Net 2989.48 ml    LBM:   Baseline Weight: Weight: 49.9 kg Most recent weight: Weight: 49.9 kg     Palliative Assessment/Data: PPS 20% d/t PO intake    Time Total: 70 minutes Greater than 50%  of this time was spent counseling and coordinating care related to the above assessment and plan.  Juel Burrow, DNP,  AGNP-C Palliative Medicine Team [phone] Pager: [phone]

## 2020-01-10 NOTE — Progress Notes (Signed)
Initial Nutrition Assessment  DOCUMENTATION CODES:   Severe malnutrition in context of social or environmental circumstances  INTERVENTION:  Provide Ensure Enlive po BID, each supplement provides 350 kcal and 20 grams of protein.  Provide Magic cup TID with meals, each supplement provides 290 kcal and 9 grams of protein.  Monitor magnesium, potassium, and phosphorus daily for at least 3 days, MD to replete as needed, as pt is at risk for refeeding syndrome given severe malnutrition.  NUTRITION DIAGNOSIS:   Severe Malnutrition related to social / environmental circumstances(advanced dementia, inadequate oral intake) as evidenced by at least 14.1% weight loss over the past 6 months, severe fat depletion, severe muscle depletion.  GOAL:   Patient will meet greater than or equal to 90% of their needs  MONITOR:   PO intake, Supplement acceptance, Labs, Weight trends, I & O's  REASON FOR ASSESSMENT:   Consult Assessment of nutrition requirement/status  ASSESSMENT:   86 year old female with PMHx of advanced dementia, HTN, arthritis admitted with generalized weakness, decreased oral intake, failure to thrive, iron deficiency anemia, acute renal failure.   Met with patient at bedside but she was sleeping. According to chart patient unable to provide history in setting of advanced dementia. She is ordered for dysphagia 1 diet with thin liquids but is eating 0% of her meals.    According to weight history in chart patient was 58.1 kg on 07/17/2019 and 55.8 kg on 10/02/2019. She is currently 49.9 kg (110 lbs) if current weight is accurate but she actually appears to weigh less than that on assessment. She has lost at least 8.2 kg (14.1% body weight) over the past 6 months, which is significant for time frame.  Medications reviewed and include: D5-1/2 NS with KCl 20 mEq/L at 100 mL/hr (120 grams dextrose, 408 kcal daily).  Labs reviewed: Sodium 146, Potassium 3.3, BUN 47.  NUTRITION -  FOCUSED PHYSICAL EXAM:    Most Recent Value  Orbital Region  Severe depletion  Upper Arm Region  Severe depletion  Thoracic and Lumbar Region  Severe depletion  Buccal Region  Severe depletion  Temple Region  Severe depletion  Clavicle Bone Region  Severe depletion  Clavicle and Acromion Bone Region  Severe depletion  Scapular Bone Region  Severe depletion  Dorsal Hand  Severe depletion  Patellar Region  Severe depletion  Anterior Thigh Region  Severe depletion  Posterior Calf Region  Severe depletion  Edema (RD Assessment)  None  Hair  Reviewed  Eyes  Unable to assess  Mouth  Unable to assess  Skin  Reviewed  Nails  Reviewed     Diet Order:   Diet Order            DIET - DYS 1 Room service appropriate? Yes; Fluid consistency: Thin  Diet effective now             EDUCATION NEEDS:   No education needs have been identified at this time  Skin:  Skin Assessment: Reviewed RN Assessment  Last BM:  Unknown  Height:   Ht Readings from Last 1 Encounters:  01/09/20 5' 3" (1.6 m)   Weight:   Wt Readings from Last 1 Encounters:  01/09/20 49.9 kg   Ideal Body Weight:  52.3 kg  BMI:  Body mass index is 19.49 kg/m.  Estimated Nutritional Needs:   Kcal:  1300-1500  Protein:  65-75 grams  Fluid:  1.3-1.5 L/day  Jacklynn Barnacle, MS, RD, LDN Pager number available on Amion

## 2020-01-10 NOTE — TOC Progression Note (Signed)
Transition of Care [facility]) - Progression Note    Patient Details  Name: CATELYN FRIEL MRN: [id number] Date of Birth: July 11, 1930  Transition of Care [facility]) CM/SW Contact  Annsleigh Dragoo, Lemar Livings, LCSW Phone Number: 01/10/2020, 3:27 PM  Clinical Narrative:  Informed MD Carola Frost is here and currently meeting with palliative care NP. MD reports he will come down and talk with son.    Expected Discharge Plan: Home w Home Health Services Barriers to Discharge: Continued Medical Work up  Expected Discharge Plan and Services Expected Discharge Plan: Home w Home Health Services In-house Referral: Clinical Social Work   Post Acute Care Choice: Home Health Living arrangements for the past 2 months: Single Family Home                                       Social Determinants of Health (SDOH) Interventions    Readmission Risk Interventions No flowsheet data found.

## 2020-01-10 NOTE — Progress Notes (Signed)
The nurse messaged Dr. Maryfrances Bunnell informing him pt's son is at bedside.

## 2020-01-10 NOTE — Progress Notes (Addendum)
PROGRESS NOTE    [name]  QJJ:[id number] DOB: 1930-04-18 DOA: 01/09/2020 PCP: Malva Limes, MD      Brief Narrative:  Maria Bush is a 86 y.o. F with advanced dementia, HTN and recent admission for weakness and dehydration who returns for same.  In the ER, BP 90/77 mmHg, WBC normal, CXR clear, minimal increase in Cr from 0.8 to 1.0.  Started on IV fluids and admitted.      Assessment & Plan:  Advanced dementia Failure to thrive Hypernatremia Minimal oral intake last 24 hours, must be prompted to take anything by mouth.  Na up to 146 despite IV fluids.  I discussed with SLP, likely unable to keep up with fluid needs to survive. -Hold donepezil, no utility in advanced dementia -Hold Lasix -SLP and dietitian consults -Palliative Care -Continue IV fluids  Severe protein calorie malnutirtion Degree of malnutrition confirmed by dietitian.  Clearly long term progression of dementia.  -Daily RFP to monitor for refeeding syndrome   Hypokalemia -Add K to IV fluids -Hold Lasix  Renal failure ruled out  UTI ruled out -Hold cipro  Hypertension BP soft -Hold atenolol, chlorthalidone, Lasix, lisinopril  Chronic iron deficiency anemia Hgb stable, no clinical bleeding         Disposition: The patient was admitted with generalized weakness, failure to thrive.  In the last 24 hours, her electrolytes have worsened despite IV fluids.  At this time, continued inpatient services are reasonable and expected, given the patient's age, advanced dementia and needs for continued IV fluids and the high likelihood of an adverse outcome, including readmission, debility or death if the patient were to be discharged prematurely.  She had significant improvement during rehab after her last hospital stay, up to the point of walking and feeding herself again, then returned home and got worse again.  We will reevaluate by physical therapy, continue IV fluids.          MDM: The below labs and imaging reports were reviewed and summarized above.  Medication management as above.     DVT prophylaxis: Lovenox Code Status: FULL Family Communication: Daughter by phone    Consultants:   Dietitian, speech therapy  Procedures:     Antimicrobials:      Culture data:              Subjective: Patient is fatigued and sleepy, but has had no fever, cough, urinary frequency, diarrhea, abdominal pain, respiratory complaints.  Objective: Vitals:   01/09/20 1930 01/09/20 2000 01/09/20 2158 01/10/20 0750  BP: (!) 105/58  (!) 118/53 124/61  Pulse:   72 76  Resp: 14  16 17   Temp:   97.9 F (36.6 C) 98 F (36.7 C)  TempSrc:   Oral Oral  SpO2:  98% 92% 94%  Weight:      Height:        Intake/Output Summary (Last 24 hours) at 01/10/2020 1431 Last data filed at 01/10/2020 1330 Gross per 24 hour  Intake 1789.48 ml  Output 300 ml  Net 1489.48 ml   Filed Weights   01/09/20 1509  Weight: 49.9 kg    Examination: General appearance: Frail elderly adult female, sleeping but easily arousable, in no acute distress.   HEENT: Anicteric, conjunctiva pink, lids and lashes normal for age. No nasal deformity, discharge, epistaxis.  Lips dry, tongue tacky dry, partially edentulous, no oral lesions, hearing diminished.   Skin: Warm and dry.  No jaundice.  No suspicious rashes or lesions. Cardiac:  RRR, nl S1-S2, no murmurs appreciated.  Capillary refill is brisk.  JVP normal.  No LE edema.  Radial pulses 2+ and symmetric. Respiratory: Normal respiratory rate and rhythm.  CTAB without rales or wheezes. Abdomen: Abdomen soft.  No TTP or guarding. No ascites, distension, hepatosplenomegaly.   MSK: No deformities or effusions.  Thenar wasting, temporal wasting, severe diffuse loss of subcutaneous muscle mass and fat Neuro: Awake but sleepy.  EOMI, moves all extremities with severe generalized weakness. Speech slow and mostly unintelligible, says  "yes no" answers Psych: Awake but very sleepy, attention diminished. Affect blunted.  Judgment and insight appear severely impaired by dementia.  Oriented to self only.    Data Reviewed: I have personally reviewed following labs and imaging studies:  CBC: Recent Labs  Lab 01/09/20 1559 01/10/20 0255  WBC 10.5 9.5  NEUTROABS 8.2*  --   HGB 10.0* 9.6*  HCT 30.9* 30.2*  MCV 102.7* 102.7*  PLT 429* 629   Basic Metabolic Panel: Recent Labs  Lab 01/09/20 1559 01/10/20 0255  NA 140 146*  K 4.0 3.3*  CL 99 107  CO2 29 26  GLUCOSE 122* 136*  BUN 68* 57*  CREATININE 1.09* 0.95  CALCIUM 9.3 9.0   GFR: Estimated Creatinine Clearance: 32.9 mL/min (by C-G formula based on SCr of 0.95 mg/dL). Liver Function Tests: Recent Labs  Lab 01/09/20 1559  AST 20  ALT 10  ALKPHOS 54  BILITOT 0.7  PROT 8.3*  ALBUMIN 3.6   No results for input(s): LIPASE, AMYLASE in the last 168 hours. No results for input(s): AMMONIA in the last 168 hours. Coagulation Profile: No results for input(s): INR, PROTIME in the last 168 hours. Cardiac Enzymes: No results for input(s): CKTOTAL, CKMB, CKMBINDEX, TROPONINI in the last 168 hours. BNP (last 3 results) No results for input(s): PROBNP in the last 8760 hours. HbA1C: No results for input(s): HGBA1C in the last 72 hours. CBG: No results for input(s): GLUCAP in the last 168 hours. Lipid Profile: No results for input(s): CHOL, HDL, LDLCALC, TRIG, CHOLHDL, LDLDIRECT in the last 72 hours. Thyroid Function Tests: No results for input(s): TSH, T4TOTAL, FREET4, T3FREE, THYROIDAB in the last 72 hours. Anemia Panel: Recent Labs    01/09/20 1830 01/09/20 2000  VITAMINB12  --  711  TIBC 220*  --   IRON 24*  --    Urine analysis:    Component Value Date/Time   COLORURINE YELLOW (A) 01/09/2020 1600   APPEARANCEUR HAZY (A) 01/09/2020 1600   APPEARANCEUR Cloudy (A) 10/02/2019 1504   LABSPEC 1.015 01/09/2020 1600   LABSPEC 1.006 08/14/2014 2204    PHURINE 6.0 01/09/2020 1600   GLUCOSEU NEGATIVE 01/09/2020 1600   GLUCOSEU Negative 08/14/2014 2204   HGBUR NEGATIVE 01/09/2020 1600   BILIRUBINUR NEGATIVE 01/09/2020 1600   BILIRUBINUR Negative 10/02/2019 1504   BILIRUBINUR Negative 08/14/2014 2204   KETONESUR NEGATIVE 01/09/2020 1600   PROTEINUR NEGATIVE 01/09/2020 1600   UROBILINOGEN 0.2 09/10/2017 1636   NITRITE NEGATIVE 01/09/2020 1600   LEUKOCYTESUR NEGATIVE 01/09/2020 1600   LEUKOCYTESUR Negative 08/14/2014 2204   Sepsis Labs: @LABRCNTIP (procalcitonin:4,lacticacidven:4)  ) Recent Results (from the past 240 hour(s))  SARS Coronavirus 2 by RT PCR     Status: None   Collection Time: 01/09/20  9:38 PM  Result Value Ref Range Status   SARS Coronavirus 2 NEGATIVE NEGATIVE Final    Comment: (NOTE) Result indicates the ABSENCE of SARS-CoV-2 RNA in the patient specimen.  The lowest concentration of SARS-CoV-2 viral copies this  assay can detect in nasopharyngeal swab specimens is 500 copies / mL.  A negative result does not preclude SARS-CoV-2 infection and should not be used as the sole basis for patient management decisions. A negative result may occur with improper specimen collection / handling, submission of a specimen other than nasopharyngeal swab, presence of viral mutation(s) within the areas targeted by this assay, and inadequate number of viral copies (<500 copies / mL) present.  Negative results must be combined with clinical observations, patient history, and epidemiological information.  The expected result is NEGATIVE.  Patient Fact Sheet:  https://wong-henderson.biz/   Provider Fact Sheet:  CheapJackpot.at   This test is not yet approved or cleared by the [place] FDA and  has been authorized for  detection and/or diagnosis of SARS-CoV-2 by FDA under an Emergency Use Authorization (EUA).  This EUA will remain in effect (meaning this test can be used) for the  duration of  the COVID-19 declaration under Section 564(b)(1) of the Act, 21 U.S.C. section 360bbb-3(b)(1), unless the authorization is terminated or revoked sooner Performed at [facility], [address] [place], [place] [address]          Radiology Studies: DG Chest Portable 1 View  Result Date: 01/09/2020 CLINICAL DATA:  Weakness EXAM: PORTABLE CHEST 1 VIEW COMPARISON:  11/17/2019 FINDINGS: The heart size and mediastinal contours are stable. No new focal airspace consolidation, pleural effusion, or pneumothorax. Advanced degenerative changes of the left shoulder. IMPRESSION: Stable exam.  No active disease. Electronically Signed   By: Duanne Guess D.O.   On: 01/09/2020 16:25        Scheduled Meds: . enoxaparin (LOVENOX) injection  30 mg Subcutaneous Q24H  . feeding supplement (ENSURE ENLIVE)  237 mL Oral BID BM   Continuous Infusions: . dextrose 5 % and 0.45 % NaCl with KCl 20 mEq/L 100 mL/hr at 01/10/20 0957  . lactated ringers Stopped (01/09/20 2100)     LOS: 0 days    Time spent: 25 minutes    Alberteen Sam, MD [facility] 01/10/2020, 2:31 PM     Please page though AMION or Epic secure chat:  For [employer], Higher education careers adviser

## 2020-01-10 NOTE — TOC Initial Note (Signed)
Transition of Care [facility]) - Initial/Assessment Note    Patient Details  Name: Maria Bush MRN: [id number] Date of Birth: Aug 13, 1930  Transition of Care [facility]) CM/SW Contact:    Maria Chris, LCSW Phone Number: 01/10/2020, 1:43 PM  Clinical Narrative:   Maria Bush via telephone to discuss the plan at discharge. Pt is not able to participate due to her dementia. Son has moved back in with pt to provide care to her. She was recently at [facility] for three weeks and then home for the past week, but has not been eating and FTT.  Pt has a daughter who is local and involved but has three teens who keep her busy, she does help Maria Bush. Maria Bush is looking into pt's long term care policy to get some assistance to help pt and relieve for him. Once home an RN will come out to the house to assess. [facility] was set up while at [facility] and will come in also for assist. Pt has the needed equipment at home already. Will work with son on discharge needs and await pt's medical stability for discharge.    Expected Discharge Plan: Home w Home Health Services Barriers to Discharge: Continued Medical Work up   Patient Goals and CMS Choice Patient states their goals for this hospitalization and ongoing recovery are:: Son answers for pt due to pt's dementia. Son voiced he wants to get services in place to get Mom home.      Expected Discharge Plan and Services Expected Discharge Plan: Home w Home Health Services In-house Referral: Clinical Social Work   Post Acute Care Choice: Home Health Living arrangements for the past 2 months: Single Family Home                                      Prior Living Arrangements/Services Living arrangements for the past 2 months: Single Family Home Lives with:: Adult Children   Do you feel safe going back to the place where you live?: Yes      Need for Family Participation in Patient Care: Yes (Comment) Care giver support system in place?: Yes  (comment) Current home services: DME, Home OT, Home PT, Home RN(Hospitla bed, transport chair and elevated commode [facility] is to follow at DC)    Activities of Daily Living      Permission Sought/Granted   Permission granted to share information with : Yes, Verbal Permission Granted  Share Information with NAME: Maria Bush     Permission granted to share info w Relationship: son     Emotional Assessment Appearance:: Appears older than stated age Attitude/Demeanor/Rapport: Unable to Assess Affect (typically observed): Calm, Unable to Assess(due to dementia)        Admission diagnosis:  Generalized weakness [R53.1] Patient Active Problem List   Diagnosis Date Noted  . Protein-calorie malnutrition, severe 01/10/2020  . History of GI bleed 01/09/2020  . Decreased oral intake 01/09/2020  . Failure to thrive in adult   . Elevated troponin 11/17/2019  . Acute renal failure (ARF) (HCC) 11/17/2019  . Generalized weakness   . Dementia without behavioral disturbance (HCC) 09/24/2019  . GIB (gastrointestinal bleeding) 03/30/2017  . Gastroesophageal reflux disease with esophagitis   . Acute post-hemorrhagic anemia 03/26/2017  . Bradycardia 03/26/2017  . Acute blood loss anemia 03/26/2017  . Diverticulosis   . GI bleed 03/25/2017  . Left shoulder pain 12/26/2015  . Edema 04/25/2015  .  Shortness of breath 04/25/2015  . Unsteady gait 04/25/2015  . Allergic rhinitis 04/24/2015  . Arthropathia 04/24/2015  . B12 deficiency 04/24/2015  . Atherosclerosis of coronary artery 04/24/2015  . DDD (degenerative disc disease), lumbar 04/24/2015  . Clinical depression 04/24/2015  . DD (diverticular disease) 04/24/2015  . Elevated CK 04/24/2015  . Atrial premature depolarization 04/24/2015  . Hypercholesteremia 04/24/2015  . BP (high blood pressure) 04/24/2015  . Anemia, iron deficiency 04/24/2015  . 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 Lower esophageal ring 07/12/2014  . 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 Urge incontinence of urine 09/01/2013  . Incomplete emptying of bladder 09/01/2013  . Other symptoms involving urinary system(788.99) 09/01/2013  . Family history of malignant neoplasm of breast 02/10/2013  . Diffuse cystic mastopathy 02/10/2013  . Arthritis    PCP:  Birdie Sons, MD Pharmacy:   [facility] [phone] [address], [place] [address] Phone: [phone] Fax: [phone]     Social Determinants of Health (SDOH) Interventions    Readmission Risk Interventions No flowsheet data found.

## 2020-01-10 NOTE — Evaluation (Addendum)
Clinical/Bedside Swallow Evaluation Patient Details  Name: Maria Bush MRN: [id number] Date of Birth: [birth year]  Today's Date: 01/10/2020 Time: SLP Start Time (ACUTE ONLY): 0820 SLP Stop Time (ACUTE ONLY): 0905 SLP Time Calculation (min) (ACUTE ONLY): 45 min  Past Medical History:  Past Medical History:  Diagnosis Date  . Acute blood loss anemia   . Advanced dementia (HCC)   . Arthritis 2005  . Diverticulosis   . GI bleed   . Heart murmur 2005  . Hypertension   . Memory loss    Past Surgical History:  Past Surgical History:  Procedure Laterality Date  . ABDOMINAL HYSTERECTOMY  1981  . BLADDER SURGERY  1992  . BREAST BIOPSY  1992  . CARDIAC CATHETERIZATION  2011  . CATARACT EXTRACTION Bilateral 2015  . CHOLECYSTECTOMY  08/09/2000  . COLONOSCOPY  1982  . DILATION AND CURETTAGE OF UTERUS  1979  . GIVENS CAPSULE STUDY N/A 03/27/2017   Procedure: GIVENS CAPSULE STUDY;  Surgeon: Bernette Redbird, MD;  Location: [facility] ENDOSCOPY;  Service: Endoscopy;  Laterality: N/A;  . RECTOCELE REPAIR  1994   HPI:  Pt is a 86 y.o. female with medical history significant for advanced Dementia, adult failure to thrive, hypertension, hospitalized from 11/16/2019 to 11/21/2019 with weakness and dehydration and discharged to nursing home where she completed 3 weeks of therapy, returning home a week prior who was brought back to the emergency room with persistent weakness, not eating, not ambulating, staying in bed.  As of history is taken from ER notes as patient is unable to contribute to history.  Per MD admitting dx: Possibly related to dehydration from poor oral intake superimposed on may be progression of her dementia; adult FTT; weakness.    Assessment / Plan / Recommendation Clinical Impression  Pt appears to present w/ oral phase dysphagia w/ significant impact from declined Cognitive status -- the decreased awareness of bolus acceptance/intake and task overall led to reduced bolus management and  control. Suspect this is what impacted the result of mild coughing w/ sip of thin liquid. Pt is at increased risk for aspiration w/ oral intake d/t Cognitive decline; also decreased ability to adequately meet full Nutritional needs orally secondary to the advanced Dementia is a concern. Noted pt's admitting dx of FTT.  During this exam, pt required verbal/tactile/visual cues for follow through w/ tasks, and bolus acceptance. Pt was often distracted and pulling off gown. She consumed trials of ice chips, thin liquids, nectar liquids and purees. Mild coughing noted x1 w/ thin liquids during suspected oral management confusion. W/ further support and monitoring, pt was able to take a few more sips of thin liquids w/ no further overt clinical s/s of aspiration noted. Similar was noted w/ trials of puree. No solids were attempted d/t Cognitive decline and Dentition status. OM exam appeared grossly [facility] w/ no unilateral weakness noted during bolus management and oral clearing. Pt tended to be guarded about opening mouth and accepting po trials.   Recommend continue current diet of Dysphagia level 1 (puree) w/ Thin liquids w/ strict feeding and aspiration precautions - monitor toleration of thin liquids and transition to Nectar liquids if need arises for safety w/ liquids. Recommend continue this Puree diet consistency at Discharge home for safest, easiest intake for pt. Recommend Pills in puree - Crushed as needed. Full Feeding Support. ST services will monitor pt's status and provide education as needed while admitted. Suspect pt could be at/near her baseline re: swallowing d/t impact from her  Baseline of advanced Dementia. Recommend f/u w/ Palliative Care services for [place]. MD/NSG updated.  SLP Visit Diagnosis: Dysphagia, oropharyngeal phase (R13.12)(advanced Dementia)    Aspiration Risk  Mild aspiration risk;Risk for inadequate nutrition/hydration    Diet Recommendation  Pureed diet (dysphagia level 1) w/ Thin  liquids; strict aspiration precautions; full feeding support at meals reducing distractions  Medication Administration: Crushed with puree(for safer swallowing)    Other  Recommendations Recommended Consults: (Dietician f/u; Palliative Care f/u for [facility]) Oral Care Recommendations: Oral care BID;Oral care before and after PO;Staff/trained caregiver to provide oral care Other Recommendations: (n/a)   Follow up Recommendations None      Frequency and Duration (n/a)  (n/a)       Prognosis Prognosis for Safe Diet Advancement: Guarded(-Fair) Barriers to Reach Goals: Cognitive deficits;Time post onset;Severity of deficits;Behavior      Swallow Study   General Date of Onset: 01/09/20 HPI: Pt is a 86 y.o. female with medical history significant for advanced Dementia, adult failure to thrive, hypertension, hospitalized from 11/16/2019 to 11/21/2019 with weakness and dehydration and discharged to nursing home where she completed 3 weeks of therapy, returning home a week prior who was brought back to the emergency room with persistent weakness, not eating, not ambulating, staying in bed.  As of history is taken from ER notes as patient is unable to contribute to history.  Per MD admitting dx: Possibly related to dehydration from poor oral intake superimposed on may be progression of her dementia; adult FTT; weakness.  Type of Study: Bedside Swallow Evaluation Previous Swallow Assessment: none noted Diet Prior to this Study: NPO Temperature Spikes Noted: No(wbc 9.5) Respiratory Status: Nasal cannula(2L) History of Recent Intubation: No Behavior/Cognition: Cooperative;Pleasant mood;Confused;Agitated;Distractible;Requires cueing;Doesn't follow directions(Awake once sitting upright in bed ) Oral Cavity Assessment: (limited d/t pt's participation) Oral Care Completed by SLP: Yes(limited d/t pt's participation) Oral Cavity - Dentition: Missing dentition(mostly Edentulous it appeared) Vision:  (n/a) Self-Feeding Abilities: Total assist Patient Positioning: Upright in bed(needed full positioning) Baseline Vocal Quality: Low vocal intensity(mumbled speech at times) Volitional Cough: Cognitively unable to elicit Volitional Swallow: Unable to elicit    Oral/Motor/Sensory Function Overall Oral Motor/Sensory Function: (no unilateral weakness noted; bolus management appropriate)   Ice Chips Ice chips: Within functional limits(grossly) Presentation: Spoon(fed; 3 trials) Other Comments: min oral confusion   Thin Liquid Thin Liquid: Impaired(mild) Presentation: Cup;Straw(fed pt; 5 trials via each - pinched straw to limit) Oral Phase Impairments: Poor awareness of bolus Oral Phase Functional Implications: Other (comment)(none) Pharyngeal  Phase Impairments: Cough - Immediate(x1 w/ straw sipping) Other Comments: decreased awareness of bolus and task led to reduced bolus control    Nectar Thick Nectar Thick Liquid: Within functional limits Presentation: Cup;Straw(fed pt; 4 trials via each) Other Comments: min oral confusion   Honey Thick Honey Thick Liquid: Not tested   Puree Puree: Within functional limits(grossly) Presentation: Spoon(fed pt; 12 trials total) Other Comments: min oral confusion   Solid     Solid: Not tested       Orinda Kenner, MS, CCC-SLP [place] [place] 01/10/2020,2:12 PM

## 2020-01-11 DIAGNOSIS — Z7189 Other specified counseling: Secondary | ICD-10-CM

## 2020-01-11 DIAGNOSIS — Z515 Encounter for palliative care: Secondary | ICD-10-CM

## 2020-01-11 LAB — RENAL FUNCTION PANEL
Albumin: 3.2 g/dL — ABNORMAL LOW (ref 3.5–5.0)
Anion gap: 9 (ref 5–15)
BUN: 24 mg/dL — ABNORMAL HIGH (ref 8–23)
CO2: 28 mmol/L (ref 22–32)
Calcium: 8.9 mg/dL (ref 8.9–10.3)
Chloride: 106 mmol/L (ref 98–111)
Creatinine, Ser: 0.74 mg/dL (ref 0.44–1.00)
GFR calc Af Amer: >60 mL/min
GFR calc non Af Amer: >60 mL/min
Glucose, Bld: 138 mg/dL — ABNORMAL HIGH (ref 70–99)
Phosphorus: 1.9 mg/dL — ABNORMAL LOW (ref 2.5–4.6)
Potassium: 3.7 mmol/L (ref 3.5–5.1)
Sodium: 143 mmol/L (ref 135–145)

## 2020-01-11 MED ORDER — ENOXAPARIN SODIUM 30 MG/0.3ML ~~LOC~~ SOLN
30.0000 mg | SUBCUTANEOUS | Status: DC
  Administered 2020-01-11 – 2020-01-12 (×2): 30 mg via SUBCUTANEOUS
  Filled 2020-01-11 (×2): qty 0.3

## 2020-01-11 MED ORDER — POTASSIUM PHOSPHATE MONOBASIC 500 MG PO TABS
1000.0000 mg | ORAL_TABLET | Freq: Three times a day (TID) | ORAL | Status: DC
  Administered 2020-01-11 (×2): 1000 mg via ORAL
  Filled 2020-01-11 (×5): qty 2

## 2020-01-11 NOTE — Progress Notes (Signed)
Daily Progress Note   Patient Name: Maria Bush       Date: 01/11/2020 DOB: 12/02/1929  Age: 86 y.o. MRN#: [id number] Attending Physician: Maria Bush, * Primary Care Physician: Maria Sons, MD Admit Date: 01/09/2020  Reason for Consultation/Follow-up: Establishing goals of care  Subjective: Worked with PT - unable to have any meaningful participation. Poor intake continues, lethargic.  Length of Stay: 1  Current Medications: Scheduled Meds:  . enoxaparin (LOVENOX) injection  30 mg Subcutaneous Q24H  . feeding supplement (ENSURE ENLIVE)  237 mL Oral BID BM  . latanoprost  1 drop Both Eyes QHS  . potassium phosphate (monobasic)  1,000 mg Oral TID WC & HS    Continuous Infusions: . lactated ringers Stopped (01/09/20 2100)    PRN Meds: acetaminophen **OR** acetaminophen, ondansetron **OR** ondansetron (ZOFRAN) IV, senna-docusate  Physical Exam Constitutional:      General: She is not in acute distress.    Comments: One word responses to significant physical stimulation, frail, cachectic  Pulmonary:     Effort: No respiratory distress.  Musculoskeletal:     Right lower leg: No edema.     Left lower leg: No edema.  Skin:    General: Skin is warm and dry.  Neurological:     Mental Status: She is disoriented.  Psychiatric:     Comments: Not interactive             Vital Signs: BP 127/64 (BP Location: Right Arm)   Pulse 84   Temp 98.1 F (36.7 C) (Oral)   Resp 16   Ht 5\' 3"  (1.6 m)   Wt 49.9 kg   SpO2 90%   BMI 19.49 kg/m  SpO2: SpO2: 90 % O2 Device: O2 Device: Nasal Cannula O2 Flow Rate: O2 Flow Rate (L/min): 2 L/min  Intake/output summary:   Intake/Output Summary (Last 24 hours) at 01/11/2020 1501 Last data filed at 01/11/2020 1039 Gross per 24 hour    Intake 1000 ml  Output 750 ml  Net 250 ml   LBM:   Baseline Weight: Weight: 49.9 kg Most recent weight: Weight: 49.9 kg       Palliative Assessment/Data: PPS 20%    Flowsheet Rows     Most Recent Value  Intake Tab  Referral Department  Hospitalist  Unit at Time of Referral  Med/Surg Unit  Palliative Care Primary Diagnosis  Neurology  Date Notified  01/10/20  Palliative Care Type  New Palliative care  Reason for referral  Clarify Goals of Care  Date of Admission  01/09/20  Date first seen by Palliative Care  01/10/20  # of days Palliative referral response time  0 Day(s)  # of days IP prior to Palliative referral  1  Clinical Assessment  Palliative Performance Scale Score  20%  Psychosocial & Spiritual Assessment  Palliative Care Outcomes  Patient/Family meeting held?  Yes  Who was at the meeting?  son  Lexa regarding hospice, Provided advance care planning, Provided end of life care assistance      Patient Active Problem List   Diagnosis Date Noted  . Protein-calorie malnutrition, severe 01/10/2020  . History of GI bleed 01/09/2020  .  Decreased oral intake 01/09/2020  . Failure to thrive in adult   . Elevated troponin 11/17/2019  . Acute renal failure (ARF) (HCC) 11/17/2019  . Generalized weakness   . Dementia without behavioral disturbance (HCC) 09/24/2019  . GIB (gastrointestinal bleeding) 03/30/2017  . Gastroesophageal reflux disease with esophagitis   . Acute post-hemorrhagic anemia 03/26/2017  . Bradycardia 03/26/2017  . Acute blood loss anemia 03/26/2017  . Diverticulosis   . GI bleed 03/25/2017  . Left shoulder pain 12/26/2015  . Edema 04/25/2015  . Shortness of breath 04/25/2015  . Unsteady gait 04/25/2015  . Allergic rhinitis 04/24/2015  . Arthropathia 04/24/2015  . B12 deficiency 04/24/2015  . Atherosclerosis of coronary artery 04/24/2015  . DDD (degenerative disc disease), lumbar 04/24/2015  . Clinical  depression 04/24/2015  . DD (diverticular disease) 04/24/2015  . Elevated CK 04/24/2015  . Atrial premature depolarization 04/24/2015  . Hypercholesteremia 04/24/2015  . BP (high blood pressure) 04/24/2015  . Anemia, iron deficiency 04/24/2015  . 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 Lower esophageal ring 07/12/2014  . 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 Urge incontinence of urine 09/01/2013  . Incomplete emptying of bladder 09/01/2013  . Other symptoms involving urinary system(788.99) 09/01/2013  . Family history of malignant neoplasm of breast 02/10/2013  . Diffuse cystic mastopathy 02/10/2013  . Arthritis     Palliative Care Assessment & Plan   HPI: 86 y.o. female  with past medical history of dementia, HLD, and HTN admitted on 01/09/2020 with failure to thrive - not walking for a week, decreased PO intake. Recently admitted for dehydration - went to rehab, returned home with reoccurrence. PMT consulted for GOC.  Assessment: Assessed patient this morning - no change - only interactive with significant physical stimulation, poor PO intake.   Family meeting with patient's son and Dr. Maryfrances Bush this afternoon. Patient's daughter, Maria Bush, was on speaker phone.  We discussed Ms. Bush' current illness and what it means in the larger context of her on-going co-morbidities.  Natural disease trajectory and expectations at EOL were discussed. Discussed natural trajectory of dementia. Discussed poor PO intake expected to continue. Discussed poor prognosis.   Discussed disposition options: rehab vs. Home with home health vs home with hospice. At this time family is considering all options.  Code status was addressed yesterday - DNR recommended - family asked for time to consider. Will discuss again tomorrow.   Family expresses hope for improvement - hope patient will eat more. Discussed that this is unlikely and continued decline should be  expected.  Plan to follow up at bedside tomorrow.  Questions and concerns were addressed. The family was encouraged to call with questions or concerns.   After meeting, CSW informed that Maria Bush had additional questions about hospice - Maria Bush is agreeable to speaking with hospice liaison about specifics of support hospice provides.  Recommendations/Plan:  Family considering all disposition options  Family considering code status  Will f/u tomorrow  Code Status:  Full code  Prognosis:   weeks  Discharge Planning:  To Be Determined  Care plan was discussed with RN, Dr. Maryfrances Bush, social work, hospice liaison  Thank you for allowing the Palliative Medicine Team to assist in the care of this patient.   Total Time 35 minutes Prolonged Time Billed  no       Greater than 50%  of this time was spent counseling and coordinating care related to the above assessment and plan.  Gerlean Ren, DNP, AGNP-C Palliative  Medicine Team Team Phone # [phone]  Pager [phone]

## 2020-01-11 NOTE — Evaluation (Signed)
Physical Therapy Evaluation Patient Details Name: Maria Bush MRN: [id number] DOB: 09/27/1930 Today's Date: 01/11/2020   History of Present Illness  86 y.o. female with medical history significant for dementia, adult failure to thrive, hypertension, hospitalized from 11/16/2019 to 11/21/2019 with weakness and dehydration and discharged to nursing home where she completed 3 weeks of therapy, returning home a week prior who was brought back to the emergency room with persistent weakness, not eating, not ambulating, staying in bed. Pt was very limited in ability to participate with PT this date.  Clinical Impression  Limited eval.  Pt was unable to meaningfully participate despite plenty of cuing and encouragement, she hardly opened her eyes and even stating just her first name was difficult.  Pt could not follow most simple (1 step) commands and needed direct and heavy assist for all attempts at U&LE movement and transition to sitting.  Pt's cognition precluded any attempt at standing, she was unable to attain full upright sitting at EOB - though she did maintain her balance briefly in sitting while heavily leaning on L UE.      Follow Up Recommendations Supervision/Assistance - 24 hour;SNF(per progress, apparently made gains last time at [facility])    Equipment Recommendations  None recommended by PT    Recommendations for Other Services       Precautions / Restrictions Precautions Precautions: Fall Restrictions Weight Bearing Restrictions: No      Mobility  Bed Mobility Overal bed mobility: Needs Assistance Bed Mobility: Supine to Sit;Sit to Supine     Supine to sit: Max assist Sit to supine: Max assist   General bed mobility comments: Pt struggled to tolerate total assist getting to EOB, showed zero effort despite plenty of cuing, encouragement, etc.  Pt leaning to the L, unable to attain actual upright sitting (nearly falling over to the L) but did manage to stay "upright" leaning  on L UE briefly w/o hands on assist  Transfers                 General transfer comment: deferred, pt too confused, weak and resistant to movement.  Unsafe/inappropriate to attempt at this time  Ambulation/Gait                Stairs            Wheelchair Mobility    Modified Rankin (Stroke Patients Only)       Balance Overall balance assessment: Needs assistance Sitting-balance support: Single extremity supported;Feet supported Sitting balance-Leahy Scale: Poor Sitting balance - Comments: pt leaning heavily to the L, unable to attain upright sitting posture even with heavy assist and cuing       Standing balance comment: deferred                             Pertinent Vitals/Pain Pain Assessment: Faces Faces Pain Scale: Hurts even more Pain Location: unable to state or rate, groaning during passive transition to sitting    Home Living Family/patient expects to be discharged to:: Unsure                      Prior Function Level of Independence: Needs assistance         Comments: pt unable to report, but apparently after recent bout of rehab she got back to some ambulation/feeding?     Hand Dominance        Extremity/Trunk Assessment   Upper Extremity  Assessment Upper Extremity Assessment: Difficult to assess due to impaired cognition(Pt could not show any AROM in either UE despite heavy cuing)    Lower Extremity Assessment Lower Extremity Assessment: Difficult to assess due to impaired cognition(again very confused and unable to show purposful mvt)       Communication   Communication: (dementia, severely limited communication today)  Cognition Arousal/Alertness: Lethargic Behavior During Therapy: Flat affect Overall Cognitive Status: History of cognitive impairments - at baseline                                 General Comments: unsure of true baseline, however pt could only (barely) state her first  name, otherwise answered no questions or was able to engage in any meaningful conversation      General Comments General comments (skin integrity, edema, etc.): Pt only inconsistently able to open eyes, very little verbal communication, difficult assessement secondary to pt's mental status    Exercises     Assessment/Plan    PT Assessment Patient needs continued PT services  PT Problem List Decreased cognition;Decreased strength;Decreased range of motion;Decreased activity tolerance;Decreased balance;Decreased mobility;Decreased coordination;Decreased knowledge of use of DME;Decreased safety awareness;Pain       PT Treatment Interventions DME instruction;Gait training;Functional mobility training;Therapeutic activities;Therapeutic exercise;Balance training;Neuromuscular re-education;Cognitive remediation;Patient/family education    PT Goals (Current goals can be found in the Care Plan section)  Acute Rehab PT Goals Patient Stated Goal: unable to state PT Goal Formulation: Patient unable to participate in goal setting Time For Goal Achievement: 01/25/20 Potential to Achieve Goals: Poor    Frequency Min 2X/week(will initiate PT trial to further assess appropriateness)   Barriers to discharge        Co-evaluation               AM-PAC PT "6 Clicks" Mobility  Outcome Measure Help needed turning from your back to your side while in a flat bed without using bedrails?: Total Help needed moving from lying on your back to sitting on the side of a flat bed without using bedrails?: Total Help needed moving to and from a bed to a chair (including a wheelchair)?: Total Help needed standing up from a chair using your arms (e.g., wheelchair or bedside chair)?: Total Help needed to walk in hospital room?: Total Help needed climbing 3-5 steps with a railing? : Total 6 Click Score: 6    End of Session   Activity Tolerance: Patient limited by pain(limited due to cognition) Patient  left: with bed alarm set;with call bell/phone within reach Nurse Communication: Mobility status PT Visit Diagnosis: Difficulty in walking, not elsewhere classified (R26.2);Adult, failure to thrive (R62.7);Muscle weakness (generalized) (M62.81)    Time: 1610-9604 PT Time Calculation (min) (ACUTE ONLY): 12 min   Charges:   PT Evaluation $PT Eval Low Complexity: 1 Low          Kreg Shropshire, DPT 01/11/2020, 11:16 AM

## 2020-01-11 NOTE — TOC Progression Note (Signed)
Transition of Care [facility]) - Progression Note    Patient Details  Name: Maria Bush MRN: [id number] Date of Birth: 02-Nov-1930  Transition of Care [facility]) CM/SW Contact  Shandrell Boda, Lemar Livings, LCSW Phone Number: 01/11/2020, 4:01 PM  Clinical Narrative:   Sherron Monday with Anthony-son and daughter-Angela was on speaker phone to discuss the plan for their Mom. Discussed options of rehab, home with palliative care, home with hospice and the hospice home. Son reports when pt was at [facility] she did well and perked up and then once home she seemed to decline. Both had questions regarding hours hospice provides at home. Son is trying to work at least four hours at home and will need some coverage. He also has questions regarding number of hours hospice can cover at home. Discussed will have Shae or Clydie Braun via hospice to answer their questions. Clydie Braun to come and talk with Ethelene Browns. Also informed them pt will be in her co-pay for Medicare days in a rehab which is $180.00 per day. Both want to see if [facility] could take pt and have this option available to them and have information from hospice also. Aware pt needs 24 hr physical care at discharge. Will gather information for family to be able to make the most informed decision. Have sent information to [facility] and will contact in am if have not heard if can offer bed, so can give children this option to choose from. Ethelene Browns and Marylene Land will discuss this and decide what the plan is for pt and will discuss in am. Will see in am.     Expected Discharge Plan: Home w Home Health Services Barriers to Discharge: Continued Medical Work up  Expected Discharge Plan and Services Expected Discharge Plan: Home w Home Health Services In-house Referral: Clinical Social Work   Post Acute Care Choice: Home Health Living arrangements for the past 2 months: Single Family Home                                       Social Determinants of Health (SDOH) Interventions     Readmission Risk Interventions No flowsheet data found.

## 2020-01-11 NOTE — Progress Notes (Signed)
PROGRESS NOTE    [name]  QPY:[id number] DOB: 1930/03/11 DOA: 01/09/2020 PCP: Birdie Sons, MD      Brief Narrative:  Maria Bush is a 86 y.o. F with advanced dementia, HTN and recent admission for weakness and dehydration who returns for same.  In the ER, BP 90/77 mmHg, WBC normal, CXR clear, minimal increase in Cr from 0.8 to 1.0.  Started on IV fluids and admitted.      Assessment & Plan:  Advanced dementia Failure to thrive Hypernatremia Minimal oral intake last 24 hours, must be prompted to take anything by mouth.  Na up to 146 despite IV fluids.  I discussed with SLP, likely unable to keep up with fluid needs to survive.  BUN to Cr ratio down and Na improved with further IV fluids.  Clinically however, she is more somnolent, weak. -Hold donepezil, no utility in advanced dementia -Hold all diuretics -SLP and dietitian consults -Palliative Care  Severe protein calorie malnutrition Degree of malnutrition confirmed by dietitian.  Clearly long term progression of dementia.  -Daily RFP to monitor for refeeding syndrome  Hypophosphatemia In setting of severe malnutrition and refeeding. -Supplement Phosphorus  Hypokalemia Resolved -Hold Lasix  Renal failure ruled out  UTI ruled out -Hold cipro  Hypertension BP normal now -Hold diuretics chlorthalidone and Lasix -Hold atenolol and lisinopril for now   Chronic iron deficiency anemia Hgb stable, no clinical bleeding         Disposition: The patient was admitted with generalized weakness, failure to thrive.  In the last 48 hours, despite IV fluids she is not improving.    Family would like to pursue attempted rehab.  We will need close monitoring by Palliative Care to proceed to Hospice if this fails.         MDM: The below labs and imaging reports reviewed and summarized above.  Medication management as above.     DVT prophylaxis: Lovenox Code Status: FULL Family Communication:  Son at bedside Daughter by phone    Consultants:   Dietitian, speech therapy  Procedures:     Antimicrobials:      Culture data:              Subjective: No respiratory distress, fever, cough, pain complaints.  Objective: Vitals:   01/10/20 2355 01/11/20 0809 01/11/20 0857 01/11/20 1559  BP: (!) 122/104 127/64  117/65  Pulse: (!) 103 84  69  Resp: 14 16    Temp: 97.6 F (36.4 C) 98.1 F (36.7 C)  99.1 F (37.3 C)  TempSrc: Oral Oral    SpO2: 93%  90% 99%  Weight:      Height:        Intake/Output Summary (Last 24 hours) at 01/11/2020 1630 Last data filed at 01/11/2020 1039 Gross per 24 hour  Intake 1000 ml  Output 750 ml  Net 250 ml   Filed Weights   01/09/20 1509  Weight: 49.9 kg    Examination: General appearance: Frail, cachectic adult female, sleeping, arouses to touch, no acute distress.   HEENT: Anicteric, conjunctiva pink, lids and lashes normal. No nasal deformity, discharge, epistaxis.  Lips moist, edentulous. OP moist, no oral lesions.  Hearing diminished Skin: Warm and dry.  No suspicious rashes or lesions. Cardiac: RRR, no murmurs appreciated.  No LE edema.    Respiratory: Normal respiratory rate and rhythm.  CTAB without rales or wheezes. Abdomen: Abdomen soft.  No grimace to palpation. No ascites, distension, hepatosplenomegaly.   MSK: No  deformities or effusions of the large joints of the upper or lower extremities bilaterally.  Diffuse severe loss of subcutaneous muscle mass and fat Neuro: Arouses to touch, then falls back asleep.  Speech is incoherent, moves all extremities with severe generalized weakness.    Psych: Inattentive, most incoherently, attention diminished, judgment insight appear severely impaired by dementia.     Data Reviewed: I have personally reviewed following labs and imaging studies:  CBC: Recent Labs  Lab 01/09/20 1559 01/10/20 0255  WBC 10.5 9.5  NEUTROABS 8.2*  --   HGB 10.0* 9.6*  HCT 30.9*  30.2*  MCV 102.7* 102.7*  PLT 429* 367   Basic Metabolic Panel: Recent Labs  Lab 01/09/20 1559 01/10/20 0255 01/11/20 0422  NA 140 146* 143  K 4.0 3.3* 3.7  CL 99 107 106  CO2 29 26 28   GLUCOSE 122* 136* 138*  BUN 68* 57* 24*  CREATININE 1.09* 0.95 0.74  CALCIUM 9.3 9.0 8.9  PHOS  --   --  1.9*   GFR: Estimated Creatinine Clearance: 39 mL/min (by C-G formula based on SCr of 0.74 mg/dL). Liver Function Tests: Recent Labs  Lab 01/09/20 1559 01/11/20 0422  AST 20  --   ALT 10  --   ALKPHOS 54  --   BILITOT 0.7  --   PROT 8.3*  --   ALBUMIN 3.6 3.2*   No results for input(s): LIPASE, AMYLASE in the last 168 hours. No results for input(s): AMMONIA in the last 168 hours. Coagulation Profile: No results for input(s): INR, PROTIME in the last 168 hours. Cardiac Enzymes: No results for input(s): CKTOTAL, CKMB, CKMBINDEX, TROPONINI in the last 168 hours. BNP (last 3 results) No results for input(s): PROBNP in the last 8760 hours. HbA1C: No results for input(s): HGBA1C in the last 72 hours. CBG: No results for input(s): GLUCAP in the last 168 hours. Lipid Profile: No results for input(s): CHOL, HDL, LDLCALC, TRIG, CHOLHDL, LDLDIRECT in the last 72 hours. Thyroid Function Tests: No results for input(s): TSH, T4TOTAL, FREET4, T3FREE, THYROIDAB in the last 72 hours. Anemia Panel: Recent Labs    01/09/20 1830 01/09/20 2000  VITAMINB12  --  711  TIBC 220*  --   IRON 24*  --    Urine analysis:    Component Value Date/Time   COLORURINE YELLOW (A) 01/09/2020 1600   APPEARANCEUR HAZY (A) 01/09/2020 1600   APPEARANCEUR Cloudy (A) 10/02/2019 1504   LABSPEC 1.015 01/09/2020 1600   LABSPEC 1.006 08/14/2014 2204   PHURINE 6.0 01/09/2020 1600   GLUCOSEU NEGATIVE 01/09/2020 1600   GLUCOSEU Negative 08/14/2014 2204   HGBUR NEGATIVE 01/09/2020 1600   BILIRUBINUR NEGATIVE 01/09/2020 1600   BILIRUBINUR Negative 10/02/2019 1504   BILIRUBINUR Negative 08/14/2014 2204    KETONESUR NEGATIVE 01/09/2020 1600   PROTEINUR NEGATIVE 01/09/2020 1600   UROBILINOGEN 0.2 09/10/2017 1636   NITRITE NEGATIVE 01/09/2020 1600   LEUKOCYTESUR NEGATIVE 01/09/2020 1600   LEUKOCYTESUR Negative 08/14/2014 2204   Sepsis Labs: @LABRCNTIP (procalcitonin:4,lacticacidven:4)  ) Recent Results (from the past 240 hour(s))  SARS Coronavirus 2 by RT PCR     Status: None   Collection Time: 01/09/20  9:38 PM  Result Value Ref Range Status   SARS Coronavirus 2 NEGATIVE NEGATIVE Final    Comment: (NOTE) Result indicates the ABSENCE of SARS-CoV-2 RNA in the patient specimen.  The lowest concentration of SARS-CoV-2 viral copies this assay can detect in nasopharyngeal swab specimens is 500 copies / mL.  A negative result does  not preclude SARS-CoV-2 infection and should not be used as the sole basis for patient management decisions. A negative result may occur with improper specimen collection / handling, submission of a specimen other than nasopharyngeal swab, presence of viral mutation(s) within the areas targeted by this assay, and inadequate number of viral copies (<500 copies / mL) present.  Negative results must be combined with clinical observations, patient history, and epidemiological information.  The expected result is NEGATIVE.  Patient Fact Sheet:  https://wong-henderson.biz/   Provider Fact Sheet:  CheapJackpot.at   This test is not yet approved or cleared by the [place] FDA and  has been authorized for  detection and/or diagnosis of SARS-CoV-2 by FDA under an Emergency Use Authorization (EUA).  This EUA will remain in effect (meaning this test can be used) for the duration of  the COVID-19 declaration under Section 564(b)(1) of the Act, 21 U.S.C. section 360bbb-3(b)(1), unless the authorization is terminated or revoked sooner Performed at [facility], [address], [place], [place] [address]           Radiology Studies: No results found.      Scheduled Meds: . enoxaparin (LOVENOX) injection  30 mg Subcutaneous Q24H  . feeding supplement (ENSURE ENLIVE)  237 mL Oral BID BM  . latanoprost  1 drop Both Eyes QHS  . potassium phosphate (monobasic)  1,000 mg Oral TID WC & HS   Continuous Infusions: . lactated ringers Stopped (01/09/20 2100)     LOS: 1 day    Time spent: 35 minutes    Alberteen Sam, MD [facility] 01/11/2020, 4:30 PM     Please page though AMION or Epic secure chat:  For [employer], Higher education careers adviser

## 2020-01-11 NOTE — NC FL2 (Signed)
[place] MEDICAID FL2 LEVEL OF CARE SCREENING TOOL     IDENTIFICATION  Patient Name: Maria Bush Birthdate: [birth year] Sex: female Admission Date (Current Location): 01/09/2020  [place] and [place] Number:  Chiropodist and Address:  [facility], [address], [place], [place] [address]      Provider Number: [id number]  Attending Physician Name and Address:  Alberteen Sam, *  Relative Name and Phone Number:  Ethelene Browns [phone]    Current Level of Care: Hospital Recommended Level of Care: Skilled Nursing Facility Prior Approval Number:    Date Approved/Denied:   PASRR Number: [id number]  Discharge Plan: SNF    Current Diagnoses: Patient Active Problem List   Diagnosis Date Noted  . Protein-calorie malnutrition, severe 01/10/2020  . History of GI bleed 01/09/2020  . Decreased oral intake 01/09/2020  . Failure to thrive in adult   . Elevated troponin 11/17/2019  . Acute renal failure (ARF) (HCC) 11/17/2019  . Generalized weakness   . Dementia without behavioral disturbance (HCC) 09/24/2019  . GIB (gastrointestinal bleeding) 03/30/2017  . Gastroesophageal reflux disease with esophagitis   . Acute post-hemorrhagic anemia 03/26/2017  . Bradycardia 03/26/2017  . Acute blood loss anemia 03/26/2017  . Diverticulosis   . GI bleed 03/25/2017  . Left shoulder pain 12/26/2015  . Edema 04/25/2015  . Shortness of breath 04/25/2015  . Unsteady gait 04/25/2015  . Allergic rhinitis 04/24/2015  . Arthropathia 04/24/2015  . B12 deficiency 04/24/2015  . Atherosclerosis of coronary artery 04/24/2015  . DDD (degenerative disc disease), lumbar 04/24/2015  . Clinical depression 04/24/2015  . DD (diverticular disease) 04/24/2015  . Elevated CK 04/24/2015  . Atrial premature depolarization 04/24/2015  . Hypercholesteremia 04/24/2015  . BP (high blood pressure) 04/24/2015  . Anemia, iron deficiency 04/24/2015  .  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 Lower esophageal ring 07/12/2014  . 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 Urge incontinence of urine 09/01/2013  . Incomplete emptying of bladder 09/01/2013  . Other symptoms involving urinary system(788.99) 09/01/2013  . Family history of malignant neoplasm of breast 02/10/2013  . Diffuse cystic mastopathy 02/10/2013  . Arthritis     Orientation RESPIRATION BLADDER Height & Weight     (unable to assess)  Normal Incontinent Weight: 110 lb (49.9 kg) Height:  5\' 3"  (160 cm)  BEHAVIORAL SYMPTOMS/MOOD NEUROLOGICAL BOWEL NUTRITION STATUS      Incontinent Diet(Pureed thin liquids-Dys 1)  AMBULATORY STATUS COMMUNICATION OF NEEDS Skin   Total Care Does not communicate Normal                       Personal Care Assistance Level of Assistance  Bathing, Feeding, Dressing Bathing Assistance: Maximum assistance Feeding assistance: Maximum assistance Dressing Assistance: Maximum assistance     Functional Limitations Info             SPECIAL CARE FACTORS FREQUENCY  PT (By licensed PT), Speech therapy, OT (By licensed OT)     PT Frequency: 5 x week OT Frequency: 3-5 x week     Speech Therapy Frequency: 3-5 x week      Contractures Contractures Info: Present    Additional Factors Info  Code Status Code Status Info: Full code             Current Medications (01/11/2020):  This is the current hospital active medication list Current Facility-Administered Medications  Medication Dose Route Frequency Provider Last Rate Last Admin  .  acetaminophen (TYLENOL) tablet 650 mg  650 mg Oral Q6H PRN Athena Masse, MD       Or  . acetaminophen (TYLENOL) suppository 650 mg  650 mg Rectal Q6H PRN Athena Masse, MD      . enoxaparin (LOVENOX) injection 30 mg  30 mg Subcutaneous Q24H Athena Masse, MD   30 mg at 01/10/20 2146  . feeding supplement (ENSURE ENLIVE) (ENSURE ENLIVE) liquid 237 mL  237  mL Oral BID BM Danford, Suann Larry, MD   237 mL at 01/11/20 1300  . lactated ringers infusion   Intravenous Continuous Duffy Bruce, MD   Stopped at 01/09/20 2100  . latanoprost (XALATAN) 0.005 % ophthalmic solution 1 drop  1 drop Both Eyes QHS Philis Pique, NP   1 drop at 01/10/20 2146  . ondansetron (ZOFRAN) tablet 4 mg  4 mg Oral Q6H PRN Athena Masse, MD       Or  . ondansetron [facility]) injection 4 mg  4 mg Intravenous Q6H PRN Athena Masse, MD      . potassium phosphate (monobasic) (K-PHOS ORIGINAL) tablet 1,000 mg  1,000 mg Oral TID WC & HS Danford, Suann Larry, MD   1,000 mg at 01/11/20 1330  . senna-docusate (Senokot-S) tablet 1 tablet  1 tablet Oral QHS PRN Athena Masse, MD         Discharge Medications: Please see discharge summary for a list of discharge medications.  Relevant Imaging Results:  Relevant Lab Results:   Additional Information SSN; [id number]  Kyrie Fludd, Gardiner Rhyme, LCSW

## 2020-01-11 NOTE — Progress Notes (Signed)
Asked by Palliative NP Tilden Fossa to speak with patient's son Ethelene Browns to answer questions about hospice care. Information given, questions answered. Writer's contact information given to [employer]. He plans to speak with his sister this evening. Writer encouraged Ethelene Browns to have his sister call with any questions. He was appreciative of the information and visit. He plans to meet again with the Palliative NP Darliss Cheney tomorrow around 11. Thank you for this opportunity. Dayna Barker BSN, RN, [facility] [employer] [phone]

## 2020-01-12 ENCOUNTER — Telehealth: Payer: Self-pay | Admitting: Family Medicine

## 2020-01-12 LAB — RENAL FUNCTION PANEL
Albumin: 3 g/dL — ABNORMAL LOW (ref 3.5–5.0)
Anion gap: 8 (ref 5–15)
BUN: 19 mg/dL (ref 8–23)
CO2: 26 mmol/L (ref 22–32)
Calcium: 8.7 mg/dL — ABNORMAL LOW (ref 8.9–10.3)
Chloride: 108 mmol/L (ref 98–111)
Creatinine, Ser: 0.69 mg/dL (ref 0.44–1.00)
GFR calc Af Amer: >60 mL/min (ref >60)
GFR calc non Af Amer: >60 mL/min (ref >60)
Glucose, Bld: 115 mg/dL — ABNORMAL HIGH (ref 70–99)
Phosphorus: 2.9 mg/dL (ref 2.5–4.6)
Potassium: 3.4 mmol/L — ABNORMAL LOW (ref 3.5–5.1)
Sodium: 142 mmol/L (ref 135–145)

## 2020-01-12 NOTE — Progress Notes (Signed)
Daily Progress Note   Patient Name: Maria Bush       Date: 01/12/2020 DOB: Mar 28, 1930  Age: 86 y.o. MRN#: [id number] Attending Physician: Edwin Dada, * Primary Care Physician: Birdie Sons, MD Admit Date: 01/09/2020  Reason for Consultation/Follow-up: Establishing goals of care  Subjective: Visited patient multiple times today - mostly sleeping, not interactive.   Length of Stay: 2  Current Medications: Scheduled Meds:  . enoxaparin (LOVENOX) injection  30 mg Subcutaneous Q24H  . feeding supplement (ENSURE ENLIVE)  237 mL Oral BID BM  . latanoprost  1 drop Both Eyes QHS    Continuous Infusions:   PRN Meds: acetaminophen **OR** acetaminophen, ondansetron **OR** ondansetron (ZOFRAN) IV, senna-docusate  Physical Exam Constitutional:      General: She is not in acute distress.    Comments: One word responses to significant physical stimulation, frail, cachectic  Pulmonary:     Effort: No respiratory distress.  Musculoskeletal:     Right lower leg: No edema.     Left lower leg: No edema.  Skin:    General: Skin is warm and dry.  Psychiatric:     Comments: Not interactive             Vital Signs: BP (!) 146/130 (BP Location: Right Arm)   Pulse (!) 125   Temp 98.6 F (37 C)   Resp 14   Ht '5\' 3"'$  (1.6 m)   Wt 49.9 kg   SpO2 99%   BMI 19.49 kg/m  SpO2: SpO2: 99 % O2 Device: O2 Device: Room Air O2 Flow Rate: O2 Flow Rate (L/min): 2 L/min  Intake/output summary:   Intake/Output Summary (Last 24 hours) at 01/12/2020 1745 Last data filed at 01/12/2020 8016 Gross per 24 hour  Intake 100 ml  Output 650 ml  Net -550 ml   LBM:   Baseline Weight: Weight: 49.9 kg Most recent weight: Weight: 49.9 kg       Palliative Assessment/Data: PPS 20%    Flowsheet  Rows     Most Recent Value  Intake Tab  Referral Department  Hospitalist  Unit at Time of Referral  Med/Surg Unit  Palliative Care Primary Diagnosis  Neurology  Date Notified  01/10/20  Palliative Care Type  New Palliative care  Reason for referral  Clarify Goals of Care  Date of Admission  01/09/20  Date first seen by Palliative Care  01/10/20  # of days Palliative referral response time  0 Day(s)  # of days IP prior to Palliative referral  1  Clinical Assessment  Palliative Performance Scale Score  20%  Psychosocial & Spiritual Assessment  Palliative Care Outcomes  Patient/Family meeting held?  Yes  Who was at the meeting?  son  [place] regarding hospice, Provided advance care planning, Provided end of life care assistance      Patient Active Problem List   Diagnosis Date Noted  . Goals of care, counseling/discussion   . Palliative care by specialist   . Protein-calorie malnutrition, severe 01/10/2020  . History of GI bleed 01/09/2020  . Decreased oral intake 01/09/2020  . Failure to thrive in adult   . Elevated troponin 11/17/2019  .  Acute renal failure (ARF) ([place]) 11/17/2019  . Generalized weakness   . Dementia without behavioral disturbance ([place]) 09/24/2019  . GIB (gastrointestinal bleeding) 03/30/2017  . Gastroesophageal reflux disease with esophagitis   . Acute post-hemorrhagic anemia 03/26/2017  . Bradycardia 03/26/2017  . Acute blood loss anemia 03/26/2017  . Diverticulosis   . GI bleed 03/25/2017  . Left shoulder pain 12/26/2015  . Edema 04/25/2015  . Shortness of breath 04/25/2015  . Unsteady gait 04/25/2015  . Allergic rhinitis 04/24/2015  . Arthropathia 04/24/2015  . B12 deficiency 04/24/2015  . Atherosclerosis of coronary artery 04/24/2015  . DDD (degenerative disc disease), lumbar 04/24/2015  . Clinical depression 04/24/2015  . DD (diverticular disease) 04/24/2015  . Elevated CK 04/24/2015  . Atrial premature  depolarization 04/24/2015  . Hypercholesteremia 04/24/2015  . BP (high blood pressure) 04/24/2015  . Anemia, iron deficiency 04/24/2015  . 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 Lower esophageal ring 07/12/2014  . 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 Urge incontinence of urine 09/01/2013  . Incomplete emptying of bladder 09/01/2013  . Other symptoms involving urinary system(788.99) 09/01/2013  . Family history of malignant neoplasm of breast 02/10/2013  . Diffuse cystic mastopathy 02/10/2013  . Arthritis     Palliative Care Assessment & Plan   HPI: 86 y.o. female  with past medical history of dementia, HLD, and HTN admitted on 01/09/2020 with failure to thrive - not walking for a week, decreased PO intake. Recently admitted for dehydration - went to rehab, returned home with reoccurrence. PMT consulted for [place].  Assessment: Met with Elberta Fortis again today - he tells me he has decided to take patient home with hospice. He asks if this is reasonable and if this is what other people in his situation do. We discussed why this plan makes sense for Maria Bush given Anthony's goals for her.  He asked for information about dementia disease process - this was discussed again and I provided him with educational material.  He requested to meet with SLP again so that he can feel a little more comfortable feeding his mother at home - discussed with Belenda Cruise, SLP who came to see them.  Discussed code status a couple of times today - both times Elberta Fortis tells me he has not yet discussed this with his sister and would like to wait to change code status until he discussed it with her. We discussed full code being inappropriate given patient's condition and goals of care, recommended DNR. Elberta Fortis tells me he thinks he agrees but he will get back with team tomorrow.  Recommendations/Plan:  Home with hospice  PMT will NOT be here tomorrow - please follow up on code  status prior to sending patient home with hospice, Elberta Fortis agrees to DNR but requested time to discuss with sister prior to changing it  Code Status:  Full code  Prognosis:   days- weeks  Discharge Planning:  Home with [place] was discussed with RN, Dr. Loleta Books, social work, hospice liaison  Thank you for allowing the Palliative Medicine Team to assist in the care of this patient.   Total Time 35 minutes Prolonged Time Billed  no       Greater than 50%  of this time was spent counseling and coordinating care related to the above assessment and plan.  Juel Burrow, DNP, [facility] Palliative Medicine Team Team Phone # [phone]  Pager [phone]

## 2020-01-12 NOTE — Plan of Care (Signed)
  Problem: Clinical Measurements: Goal: Ability to maintain clinical measurements within normal limits will improve Outcome: Progressing Goal: Will remain free from infection Outcome: Progressing Goal: Diagnostic test results will improve Outcome: Progressing Goal: Respiratory complications will improve Outcome: Progressing Goal: Cardiovascular complication will be avoided Outcome: Progressing   Problem: Activity: Goal: Risk for activity intolerance will decrease Outcome: Progressing   Problem: Nutrition: Goal: Adequate nutrition will be maintained Outcome: Progressing   Problem: Health Behavior/Discharge Planning: Goal: Ability to manage health-related needs will improve Outcome: Not Progressing  Palliative consulted. Patient unable to independently manage health related needs. Patient's son active in plan of care. Son educated on plan of care and anticipated health needs at this time.

## 2020-01-12 NOTE — Progress Notes (Signed)
PROGRESS NOTE    Maria Bush  PYP:[id number] DOB: 07-Sep-1930 DOA: 01/09/2020 PCP: Malva Limes, MD      Brief Narrative:  Maria Bush is a 86 y.o. F with advanced dementia, HTN and recent admission for weakness and dehydration who returns for same.  In the ER, BP 90/77 mmHg, WBC normal, CXR clear, minimal increase in Cr from 0.8 to 1.0.  Started on IV fluids and admitted.      Assessment & Plan:  Advanced dementia Failure to thrive Hypernatremia Minimal oral intake last 24 hours, must be prompted to take anything by mouth.  Na up to 146 despite IV fluids.  I discussed with SLP, likely unable to keep up with fluid needs to survive.  BUN Cr and K and Phos normalized, no improvement in mentation.  No fever or WBC to suggest infection.  -Hold donepezil, no utility in advanced dementia -Hold all diuretics -SLP and dietitian consults -Palliative Care  Severe protein calorie malnutrition Degree of malnutrition confirmed by dietitian.  Clearly long term progression of dementia.   Hypophosphatemia In setting of severe malnutrition and refeeding.  REsolved.  Hypokalemia Resolved -Hold Lasix  Renal failure ruled out  UTI ruled out  Hypertension BP normal now -Hold diuretics chlorthalidone and Lasix -Hold atenolol and lisinopril for now   Chronic iron deficiency anemia Hgb stable, no clinical bleeding         Disposition: The patient was admitted with generalized weakness, failure to thrive. She has been persistently unable to eat. Palliative care were consulted and we are currently dicsussing Hospice enrollment.    We will discharge likely in 24 hours when Hospice has been set up and appropriate DME are in place to car for the patient at end of life.          MDM: The below labs and imaging reports reviewed and summarized above.  Medication management as above.      DVT prophylaxis: Lovenox Code Status: FULL Family Communication: Son at  bedside Daughter by phone    Consultants:   Dietitian, speech therapy  Procedures:     Antimicrobials:      Culture data:              Subjective: No respiratory distress, fever, cough, pain complaints.  Objective: Vitals:   01/11/20 1559 01/12/20 0100 01/12/20 0753 01/12/20 1630  BP: 117/65 115/79 (!) 103/55 (!) 146/130  Pulse: 69 94 81 (!) 125  Resp:  17 14 14   Temp: 99.1 F (37.3 C) 98.6 F (37 C) 98.3 F (36.8 C) 98.6 F (37 C)  TempSrc:  Oral    SpO2: 99% 98% 100% 99%  Weight:      Height:        Intake/Output Summary (Last 24 hours) at 01/12/2020 1749 Last data filed at 01/12/2020 01/14/2020 Gross per 24 hour  Intake 100 ml  Output 650 ml  Net -550 ml   Filed Weights   01/09/20 1509  Weight: 49.9 kg    Examination: General appearance: Frail, cachectic adult female, sleeping, arouses to touch, no acute distress.   HEENT: Anicteric, conjunctiva pink, lids and lashes normal. No nasal deformity, discharge, epistaxis.  Lips moist, edentulous. OP moist, no oral lesions.  Hearing diminished Skin: Warm and dry.  No suspicious rashes or lesions. Cardiac: RRR, no murmurs appreciated.  No LE edema.    Respiratory: Normal respiratory rate and rhythm.  CTAB without rales or wheezes. Abdomen: Abdomen soft.  No grimace to palpation. No  ascites, distension, hepatosplenomegaly.   MSK: No deformities or effusions of the large joints of the upper or lower extremities bilaterally.  Diffuse severe loss of subcutaneous muscle mass and fat Neuro: Arouses to touch, then falls back asleep.  Speech is incoherent, moves all extremities with severe generalized weakness.    Psych: Inattentive, most incoherently, attention diminished, judgment insight appear severely impaired by dementia.     Data Reviewed: I have personally reviewed following labs and imaging studies:  CBC: Recent Labs  Lab 01/09/20 1559 01/10/20 0255  WBC 10.5 9.5  NEUTROABS 8.2*  --   HGB 10.0*  9.6*  HCT 30.9* 30.2*  MCV 102.7* 102.7*  PLT 429* 154   Basic Metabolic Panel: Recent Labs  Lab 01/09/20 1559 01/10/20 0255 01/11/20 0422 01/12/20 0424  NA 140 146* 143 142  K 4.0 3.3* 3.7 3.4*  CL 99 107 106 108  CO2 29 26 28 26   GLUCOSE 122* 136* 138* 115*  BUN 68* 57* 24* 19  CREATININE 1.09* 0.95 0.74 0.69  CALCIUM 9.3 9.0 8.9 8.7*  PHOS  --   --  1.9* 2.9   GFR: Estimated Creatinine Clearance: 39 mL/min (by C-G formula based on SCr of 0.69 mg/dL). Liver Function Tests: Recent Labs  Lab 01/09/20 1559 01/11/20 0422 01/12/20 0424  AST 20  --   --   ALT 10  --   --   ALKPHOS 54  --   --   BILITOT 0.7  --   --   PROT 8.3*  --   --   ALBUMIN 3.6 3.2* 3.0*   No results for input(s): LIPASE, AMYLASE in the last 168 hours. No results for input(s): AMMONIA in the last 168 hours. Coagulation Profile: No results for input(s): INR, PROTIME in the last 168 hours. Cardiac Enzymes: No results for input(s): CKTOTAL, CKMB, CKMBINDEX, TROPONINI in the last 168 hours. BNP (last 3 results) No results for input(s): PROBNP in the last 8760 hours. HbA1C: No results for input(s): HGBA1C in the last 72 hours. CBG: No results for input(s): GLUCAP in the last 168 hours. Lipid Profile: No results for input(s): CHOL, HDL, LDLCALC, TRIG, CHOLHDL, LDLDIRECT in the last 72 hours. Thyroid Function Tests: No results for input(s): TSH, T4TOTAL, FREET4, T3FREE, THYROIDAB in the last 72 hours. Anemia Panel: Recent Labs    01/09/20 1830 01/09/20 2000  VITAMINB12  --  711  TIBC 220*  --   IRON 24*  --    Urine analysis:    Component Value Date/Time   COLORURINE YELLOW (A) 01/09/2020 1600   APPEARANCEUR HAZY (A) 01/09/2020 1600   APPEARANCEUR Cloudy (A) 10/02/2019 1504   LABSPEC 1.015 01/09/2020 1600   LABSPEC 1.006 08/14/2014 2204   PHURINE 6.0 01/09/2020 1600   GLUCOSEU NEGATIVE 01/09/2020 1600   GLUCOSEU Negative 08/14/2014 2204   HGBUR NEGATIVE 01/09/2020 1600   BILIRUBINUR  NEGATIVE 01/09/2020 1600   BILIRUBINUR Negative 10/02/2019 1504   BILIRUBINUR Negative 08/14/2014 2204   KETONESUR NEGATIVE 01/09/2020 1600   PROTEINUR NEGATIVE 01/09/2020 1600   UROBILINOGEN 0.2 09/10/2017 1636   NITRITE NEGATIVE 01/09/2020 1600   LEUKOCYTESUR NEGATIVE 01/09/2020 1600   LEUKOCYTESUR Negative 08/14/2014 2204   Sepsis Labs: @LABRCNTIP (procalcitonin:4,lacticacidven:4)  ) Recent Results (from the past 240 hour(s))  SARS Coronavirus 2 by RT PCR     Status: None   Collection Time: 01/09/20  9:38 PM  Result Value Ref Range Status   SARS Coronavirus 2 NEGATIVE NEGATIVE Final    Comment: (NOTE) Result  indicates the ABSENCE of SARS-CoV-2 RNA in the patient specimen.  The lowest concentration of SARS-CoV-2 viral copies this assay can detect in nasopharyngeal swab specimens is 500 copies / mL.  A negative result does not preclude SARS-CoV-2 infection and should not be used as the sole basis for patient management decisions. A negative result may occur with improper specimen collection / handling, submission of a specimen other than nasopharyngeal swab, presence of viral mutation(s) within the areas targeted by this assay, and inadequate number of viral copies (<500 copies / mL) present.  Negative results must be combined with clinical observations, patient history, and epidemiological information.  The expected result is NEGATIVE.  Patient Fact Sheet:  https://wong-henderson.biz/   Provider Fact Sheet:  CheapJackpot.at   This test is not yet approved or cleared by the [place] FDA and  has been authorized for  detection and/or diagnosis of SARS-CoV-2 by FDA under an Emergency Use Authorization (EUA).  This EUA will remain in effect (meaning this test can be used) for the duration of  the COVID-19 declaration under Section 564(b)(1) of the Act, 21 U.S.C. section 360bbb-3(b)(1), unless the authorization is terminated or  revoked sooner Performed at [facility], [address] [place], [place] [address]          Radiology Studies: No results found.      Scheduled Meds: . enoxaparin (LOVENOX) injection  30 mg Subcutaneous Q24H  . feeding supplement (ENSURE ENLIVE)  237 mL Oral BID BM  . latanoprost  1 drop Both Eyes QHS   Continuous Infusions:    LOS: 2 days    Time spent: 25 minutes    Alberteen Sam, MD [facility] 01/12/2020, 5:49 PM     Please page though AMION or Epic secure chat:  For [employer], Higher education careers adviser

## 2020-01-12 NOTE — Telephone Encounter (Signed)
That's fine

## 2020-01-12 NOTE — TOC Progression Note (Addendum)
Transition of Care [facility]) - Progression Note    Patient Details  Name: PETINA MURASKI MRN: [id number] Date of Birth: 12/31/1929  [facility]) CM/SW Contact  Shawnise Peterkin, Gardiner Rhyme, LCSW Phone Number: 01/12/2020, 11:37 AM  Clinical Narrative:   Met briefly with son to discuss he and sister's decision he reports they want pt to go home with hospice services. Speech in the room now and evaluating pt's swallow. Son to get LTC RN to come out Monday to do an assessment to get the aide in place for home. He is aware the services may have a gap with pt being at home and aide starting.Will inform [facility] about children's choice. Son has chosen [facility] for hospice. Will inform Santiago Glad of this and she will talk with son again today. Continue to work on discharge plans.    Expected Discharge Plan: [place] Barriers to Discharge: Continued Medical Work up  Expected Discharge Plan and Services Expected Discharge Plan: [place] In-house Referral: Clinical Social Work   Post Acute Care Choice: [place] arrangements for the past 2 months: Single Family Home                                       Social Determinants of Health (SDOH) Interventions    Readmission Risk Interventions No flowsheet data found.

## 2020-01-12 NOTE — Progress Notes (Signed)
New referral for [employer] Hospice services at home confirmed today by Palliative NP Harvest Dark and TOC Becky Dupree. Write spoke via telephone to patient's son Ethelene Browns. He is still working on Public librarian services set up through patient's long term care insurance. Confirmed with Ethelene Browns that patient does not have any DME needs at this time. Ethelene Browns does have hospice contact numbers. Patient information sent to referral.  If patient discharges home over the weekend PLEASE contact hospice at [phone] and fax discharge summary to [phone]. Thank you for the opportunity to be involved in the care of this patient and her family. Dayna Barker BSN, RN, [facility] [employer] [phone]

## 2020-01-12 NOTE — Telephone Encounter (Signed)
Left Kristie with [facility] a message advising her.

## 2020-01-12 NOTE — Telephone Encounter (Signed)
Pt is being discharged from hospital and family is requesting hospice service and Danford Bad is calling to see if Dr. Sherrie Mustache would agree to being the hospice attending Provider / please advise

## 2020-01-12 NOTE — TOC Progression Note (Signed)
Transition of Care [facility]) - Progression Note    Patient Details  Name: Maria Bush MRN: [id number] Date of Birth: 1930-03-30  Transition of [facility]) CM/SW Contact  Lamya Lausch, Lemar Livings, LCSW Phone Number: 01/12/2020, 3:06 PM  Clinical Narrative:   Spoke with son-Maria Bush regarding gap between hospice beginning on Monday and pt's LTC aide. Son reports now LTC-NP coming Wed to evaluate for services. Discussed hiring a private duty aide to cover until the LTC and hospice in place. He has some aides he knows that he will reach out to regarding providing service so he can work at home. Made aware MD will probably discharge pt over the weekend or today due to medical issues addressed. She has all needed equipment. Son seems to understand he can not have home health and hospice it is either one or the other. Also to hire an aide to provide care while LTC assessment and arrangements to be made.     Expected Discharge Plan: Home w Home Health Services Barriers to Discharge: Continued Medical Work up  Expected Discharge Plan and Services Expected Discharge Plan: Home w Home Health Services In-house Referral: Clinical Social Work   Post Acute Care Choice: Home Health Living arrangements for the past 2 months: Single Family Home                                       Social Determinants of Health (SDOH) Interventions    Readmission Risk Interventions No flowsheet data found.

## 2020-01-12 NOTE — Care Management Important Message (Signed)
Important Message  Patient Details  Name: Maria Bush MRN: [id number] Date of Birth: May 15, 1930   Medicare Important Message Given:  N/A - LOS <3 / Initial given by admissions     [name] [name] 01/12/2020, 7:59 AM

## 2020-01-12 NOTE — Progress Notes (Signed)
Speech Language Pathology Treatment: Dysphagia  Patient Details Name: Maria Bush MRN: [id number] DOB: 02/07/1930 Today's Date: 01/12/2020 Time: 4098-1191 SLP Time Calculation (min) (ACUTE ONLY): 60 min  Assessment / Plan / Recommendation Clinical Impression  Pt seen today for ongoing assessment of swallowing and toleration of oral diet. Pt has been on a pureed diet w/ thin liquids w/ aspiration precautions and feeding support d/t advanced Dementia. NSG reported oral holding/pocketing last evening so diet was held and Speech asked to come by for reassessment; education w/ Son and staff. Pt was resting in bed; she alerted to verbal/tactile stim opening eyes intermittently and slightly smiling. Son present in room. Pt did not appear in any distress. Per Palliative NP, Son has accepted Hospice status and services for Home Discharge today.  Pt appears to continue to present w/ oral phase dysphagia w/ significant impact from declined Cognitive status -- the Advanced Dementia. ANY decreased awareness of bolus acceptance/intake and oral phase control can increase risk for aspiration to occur. Cognitive decline can also increase risk for oral holding and pocketing to occur d/t pt's decreased oral awareness/attention. Pts w/ advanced Cognitive decline are also more likely to take less po intake in general and often are satisfied w/ Pleasure po's of bites/sips. Increased monitoring of pts' cues is necessary to lessen risk for pocketing of po's and aspiration.  During po trials given of ice chips, small, single sips of water at lips VIA CUP, and 1/2 tsps of puree, pt exhibited decreased awareness during the oral acceptance and prep stage. W/ verbal/tactile stim during presentation, she acknowledged po tasks and attended to tasks better. Lengthier oral phase time noted w/ all trials given, especially w/ purees. SLP alternated small amounts of puree w/ single, small ice chip to encourage swallowing. Mod  verbal/tactile cues given. Pt helped to hold Cup and small amounts of thin liquids were presented at lips. SLP monitored amount at lips/taken in orally carefully to not give too much. Suspect delayed pharyngeal swallow initiation w/ pt's degree of Cognitive decline as this appeared to impact 1/8 trials resulting in mild throat clear/cough post sip of thin liquid.  Pt is at increased risk for aspiration w/ any oral intake d/t advanced Cognitive decline from Advanced Dementia; also exhibits decreased ability to adequately meet full Nutritional needs orally secondary to the Advanced Dementia. W/ full support and monitoring of pt's cues, pt is able to take small amounts of thin liquids and purees, as well as ice chips, for her Pleasure. Thorough education given to Son on pt's swallowing presentation as impacted by the Advanced Dementia. Discussed, modeled, and practiced cues and strategies for safer oral intake for safer oral intake. Handouts given on aspiration precautions, strategies, and food consistency recommended for Son to take home. Son gave appreciation for information. Recommend continue current diet of Dysphagia level 1 (puree) w/ Thin liquids w/ strict feeding and aspiration precautions - monitor toleration of thin liquids. NO Straws. Recommend NSG supervision w/ All oral intake for oral holding/pocketing and oral clearing during/post po intake. IF pt continues to demonstrate oral holding or increased s/s of aspiration during po intake, then Stop and clear pt's mouth and inform MD. Recommend continue this Puree diet consistency at Discharge home for safest, easiest intake for pt. Recommend necessary Pills in puree - Crushed. Full Feeding Support and Cues. ST services will monitor pt's status and provide education while admitted. Handouts given to Son. Suspect pt could be at/near her baseline re: swallowing d/t impact from her  progressing Advanced Dementia. Son is having f/u w/ Palliative Care services and  Hospice for discharge home. MD/NSG updated.    HPI HPI: Pt is a 86 y.o. female with medical history significant for advanced Dementia, adult failure to thrive, hypertension, hospitalized from 11/16/2019 to 11/21/2019 with weakness and dehydration and discharged to nursing home where she completed 3 weeks of therapy, returning home a week prior who was brought back to the emergency room with persistent weakness, not eating, not ambulating, staying in bed.  As of history is taken from ER notes as patient is unable to contribute to history.  Per MD admitting dx: Possibly related to dehydration from poor oral intake superimposed on may be progression of her dementia; adult FTT; weakness.       SLP Plan  Continue with current plan of care(education)       Recommendations  Diet recommendations: Dysphagia 1 (puree);Thin liquid Liquids provided via: Cup;No straw Medication Administration: Crushed with puree(for safer swallowing) Supervision: Staff to assist with self feeding;Full supervision/cueing for compensatory strategies Compensations: Minimize environmental distractions;Slow rate;Small sips/bites;Lingual sweep for clearance of pocketing;Multiple dry swallows after each bite/sip;Follow solids with liquid(Check for oral clearing, pocketing) Postural Changes and/or Swallow Maneuvers: Upright 30-60 min after meal;Seated upright 90 degrees                General recommendations: (Hospice services following now per Palliative care) Oral Care Recommendations: Oral care BID;Oral care before and after PO;Staff/trained caregiver to provide oral care Follow up Recommendations: None SLP Visit Diagnosis: Dysphagia, oropharyngeal phase (R13.12)(advanced Dementia) Plan: Continue with current plan of care(education)       GO                Orinda Kenner, MS, CCC-SLP Nautia Lem 01/12/2020, 3:12 PM

## 2020-01-12 NOTE — Progress Notes (Signed)
PT Cancellation Note  Patient Details Name: Maria Bush MRN: [id number] DOB: October 29, 1930   Cancelled Treatment:    Reason Eval/Treat Not Completed: Other (comment) Pt unable to do much at all functionally.  Family has decided to go home with hospice.  Will complete PT orders at this time.  Should the situation change for some reason she and pt is needing PT and able to participate we will need new orders.   Malachi Pro, DPT 01/12/2020, 3:53 PM

## 2020-01-12 NOTE — Progress Notes (Signed)
RN spoke to Dr. Maryfrances Bunnell regarding IV access. Pt pulled out IV. Code status recently changed to DNR and pt is not requiring any IV medication at this time. Pt appears comfortable and is not in distress. For now, MD comfortable to leave pt without access. Will pass onto night team. Access may need to be obtained if pt condition changes. WCTM.

## 2020-01-12 NOTE — TOC Progression Note (Addendum)
Transition of Care [facility]) - Progression Note    Patient Details  Name: AURORAH SCHLACHTER MRN: [id number] Date of Birth: [birth year]  Transition of Care [facility]) CM/SW Contact  Charmane Protzman, Lemar Livings, LCSW Phone Number: 01/12/2020, 10:05 AM  Clinical Narrative:   Spoke with [facility] they are looking at pt;s information, but pt does own them $3000 from last admit. When son here will discuss this. Also if this the plan pt has only been here two nights will need to be here three nights for Medicare to cover SNF. Will see son when here at 11:00  10:30 Am- Spoke with [facility] he reports they will take pt back if son will pay the money pt owes the facility, then will accept. Will see son at 11:00 to inform of this option.  Expected Discharge Plan: Home w Home Health Services Barriers to Discharge: Continued Medical Work up  Expected Discharge Plan and Services Expected Discharge Plan: Home w Home Health Services In-house Referral: Clinical Social Work   Post Acute Care Choice: Home Health Living arrangements for the past 2 months: Single Family Home                                       Social Determinants of Health (SDOH) Interventions    Readmission Risk Interventions No flowsheet data found.

## 2020-01-13 MED ORDER — ENSURE ENLIVE PO LIQD: 237.0000 mL | Freq: Two times a day (BID) | ORAL | 12 refills | Status: AC

## 2020-01-13 MED ORDER — ONDANSETRON HCL 4 MG PO TABS: 4.0000 mg | ORAL_TABLET | Freq: Four times a day (QID) | ORAL | 0 refills | Status: AC | PRN

## 2020-01-13 NOTE — Discharge Summary (Signed)
Physician Discharge Summary  Maria Bush KPT:[id number] DOB: Sep 08, 1930 DOA: 01/09/2020  PCP: Malva Limes, MD  Admit date: 01/09/2020 Discharge date: 01/13/2020  Admitted From: Home  Disposition:  Home   Recommendations for Outpatient Follow-up:  1. Follow with Hospice at home   Home Health: N/A  Equipment/Devices: Hospital bed  Discharge Condition: Declining  CODE STATUS: DNR Diet recommendation: For comfort  Brief/Interim Summary: Maria Bush is a 86 y.o. F with advanced dementia, HTN and recent admission for weakness and dehydration who returns for same.  In the ER, BP 90/77 mmHg, WBC normal, CXR clear, minimal increase in Cr from 0.8 to 1.0.  Started on IV fluids and admitted.     PRINCIPAL HOSPITAL DIAGNOSIS: End stage dementia with failure to thrive    Discharge Diagnoses:   Advanced dementia Failure to thrive Hypernatremia Patient presented with decreased responsiveness, weakness and poor oral intake.  Found to have AKI, hypokalemia, and hypernatremia, consistent with inability to keep up with fluid and nutrient needs due to end stage dementia.  Started on IV fluids, and electrolytes corrected, without improvement in clinical status.  Evaluated by SLP who noted numerous characteristic behaviors of end stage dementia and inability to process oral inputs, swallow and resultant inability to keep up with fluid needs.  Patient's mentation steadily declined.  She remained bed bound and unable to sit up without maximal assist.  Palliative care were enlisted, and in discussion with patient's son and daughter after monitoring her response to treatment, it was decided that her goals of care would be to focus on getting to a home environment, and focusing on supportive cares at the end of life.  Hospice were enlisted and the patient's was discharged to home with Hospice following, LTC insurance to provide aides on Monday.   Severe protein calorie malnutrition Degree  of malnutrition confirmed by dietitian.   Severe loss of muscle mass, weight loss noted, BMI 19, poor oral intake, advanced dementia.  Hypophosphatemia  Hypokalemia  Renal failure ruled out  UTI ruled out  Hypertension BP normal now Hold diuretics chlorthalidone and Lasix and atenolol and lisinopril  Chronic iron deficiency anemia Hgb stable, no clinical bleeding           Discharge Instructions   Allergies as of 01/13/2020      Reactions   Cephalexin Shortness Of Breath   Aricept [donepezil]    Hallucinations   Prednisone Nausea And Vomiting   Sulfa Antibiotics Itching   Penicillins Rash      Medication List    STOP taking these medications   aspirin 81 MG EC tablet   atenolol 25 MG tablet Commonly known as: TENORMIN   Calcium Carbonate-Vitamin D3 600-400 MG-UNIT Tabs   chlorthalidone 25 MG tablet Commonly known as: HYGROTON   ciprofloxacin 500 MG tablet Commonly known as: Cipro   donepezil 5 MG tablet Commonly known as: ARICEPT   FeroSul 325 (65 FE) MG tablet Generic drug: ferrous sulfate   furosemide 20 MG tablet Commonly known as: LASIX   lisinopril 20 MG tablet Commonly known as: ZESTRIL   loratadine 10 MG tablet Commonly known as: CLARITIN     TAKE these medications   feeding supplement (ENSURE ENLIVE) Liqd Take 237 mLs by mouth 2 (two) times daily between meals.   latanoprost 0.005 % ophthalmic solution Commonly known as: XALATAN Place 1 drop into both eyes at bedtime.   ondansetron 4 MG tablet Commonly known as: ZOFRAN Take 1 tablet (4 mg total) by mouth  every 6 (six) hours as needed for nausea.   Travatan Z 0.004 % Soln ophthalmic solution Generic drug: Travoprost (BAK Free) Place 1 drop into both eyes at bedtime.       Allergies  Allergen Reactions  . Cephalexin Shortness Of Breath  . Aricept [Donepezil]     Hallucinations   . Prednisone Nausea And Vomiting  . Sulfa Antibiotics Itching  . Penicillins Rash     Consultations:  Palliative care   Procedures/Studies: DG Chest Portable 1 View  Result Date: 01/09/2020 CLINICAL DATA:  Weakness EXAM: PORTABLE CHEST 1 VIEW COMPARISON:  11/17/2019 FINDINGS: The heart size and mediastinal contours are stable. No new focal airspace consolidation, pleural effusion, or pneumothorax. Advanced degenerative changes of the left shoulder. IMPRESSION: Stable exam.  No active disease. Electronically Signed   By: Duanne Guess D.O.   On: 01/09/2020 16:25       Subjective: No respiratory distress, no fever. No vomiting.  Patient is unable to provide independent history.  Discharge Exam: Vitals:   01/12/20 2139 01/13/20 0828  BP: 134/81 95/65  Pulse: 95 76  Resp: 16 16  Temp: 98.7 F (37.1 C) 97.9 F (36.6 C)  SpO2: 100% 100%   Vitals:   01/12/20 0753 01/12/20 1630 01/12/20 2139 01/13/20 0828  BP: (!) 103/55 (!) 146/130 134/81 95/65  Pulse: 81 (!) 125 95 76  Resp: 14 14 16 16   Temp: 98.3 F (36.8 C) 98.6 F (37 C) 98.7 F (37.1 C) 97.9 F (36.6 C)  TempSrc:   Oral   SpO2: 100% 99% 100% 100%  Weight:      Height:        General: Pt is sleeping, stirs genlty to touch, but makes no spontaneous movemesnts or verbalizations Cardiovascular: RRR, nl S1-S2, no murmurs appreciated.   No LE edema.   Respiratory: Normal respiratory rate and rhythm.  CTAB without rales or wheezes. Abdominal: Abdomen soft and no grimace to palpation.  No distension or HSM.   Neuro/Psych: No spontaneous movements, no intelligible verbalizations.  Patient stirs gently to touch, mostly lays in contracted position.      The results of significant diagnostics from this hospitalization (including imaging, microbiology, ancillary and laboratory) are listed below for reference.     Microbiology: Recent Results (from the past 240 hour(s))  SARS Coronavirus 2 by RT PCR     Status: None   Collection Time: 01/09/20  9:38 PM  Result Value Ref Range Status   SARS  Coronavirus 2 NEGATIVE NEGATIVE Final    Comment: (NOTE) Result indicates the ABSENCE of SARS-CoV-2 RNA in the patient specimen.  The lowest concentration of SARS-CoV-2 viral copies this assay can detect in nasopharyngeal swab specimens is 500 copies / mL.  A negative result does not preclude SARS-CoV-2 infection and should not be used as the sole basis for patient management decisions. A negative result may occur with improper specimen collection / handling, submission of a specimen other than nasopharyngeal swab, presence of viral mutation(s) within the areas targeted by this assay, and inadequate number of viral copies (<500 copies / mL) present.  Negative results must be combined with clinical observations, patient history, and epidemiological information.  The expected result is NEGATIVE.  Patient Fact Sheet:  01/11/20   Provider Fact Sheet:  https://wong-henderson.biz/   This test is not yet approved or cleared by the [employer] FDA and  has been authorized for  detection and/or diagnosis of SARS-CoV-2 by FDA under an Emergency Use Authorization (EUA).  This EUA will remain in effect (meaning this test can be used) for the duration of  the COVID-19 declaration under Section 564(b)(1) of the Act, 21 U.S.C. section 360bbb-3(b)(1), unless the authorization is terminated or revoked sooner Performed at [facility], [place] [address], [place], [place] [address]      Labs: BNP (last 3 results) No results for input(s): BNP in the last 8760 hours. Basic Metabolic Panel: Recent Labs  Lab 01/09/20 1559 01/10/20 0255 01/11/20 0422 01/12/20 0424  NA 140 146* 143 142  K 4.0 3.3* 3.7 3.4*  CL 99 107 106 108  CO2 29 26 28 26   GLUCOSE 122* 136* 138* 115*  BUN 68* 57* 24* 19  CREATININE 1.09* 0.95 0.74 0.69  CALCIUM 9.3 9.0 8.9 8.7*  PHOS  --   --  1.9* 2.9   Liver Function Tests: Recent Labs  Lab 01/09/20 1559  01/11/20 0422 01/12/20 0424  AST 20  --   --   ALT 10  --   --   ALKPHOS 54  --   --   BILITOT 0.7  --   --   PROT 8.3*  --   --   ALBUMIN 3.6 3.2* 3.0*   No results for input(s): LIPASE, AMYLASE in the last 168 hours. No results for input(s): AMMONIA in the last 168 hours. CBC: Recent Labs  Lab 01/09/20 1559 01/10/20 0255  WBC 10.5 9.5  NEUTROABS 8.2*  --   HGB 10.0* 9.6*  HCT 30.9* 30.2*  MCV 102.7* 102.7*  PLT 429* 367   Cardiac Enzymes: No results for input(s): CKTOTAL, CKMB, CKMBINDEX, TROPONINI in the last 168 hours. BNP: Invalid input(s): POCBNP CBG: No results for input(s): GLUCAP in the last 168 hours. D-Dimer No results for input(s): DDIMER in the last 72 hours. Hgb A1c No results for input(s): HGBA1C in the last 72 hours. Lipid Profile No results for input(s): CHOL, HDL, LDLCALC, TRIG, CHOLHDL, LDLDIRECT in the last 72 hours. Thyroid function studies No results for input(s): TSH, T4TOTAL, T3FREE, THYROIDAB in the last 72 hours.  Invalid input(s): FREET3 Anemia work up No results for input(s): VITAMINB12, FOLATE, FERRITIN, TIBC, IRON, RETICCTPCT in the last 72 hours. Urinalysis    Component Value Date/Time   COLORURINE YELLOW (A) 01/09/2020 1600   APPEARANCEUR HAZY (A) 01/09/2020 1600   APPEARANCEUR Cloudy (A) 10/02/2019 1504   LABSPEC 1.015 01/09/2020 1600   LABSPEC 1.006 08/14/2014 2204   PHURINE 6.0 01/09/2020 1600   GLUCOSEU NEGATIVE 01/09/2020 1600   GLUCOSEU Negative 08/14/2014 2204   HGBUR NEGATIVE 01/09/2020 1600   BILIRUBINUR NEGATIVE 01/09/2020 1600   BILIRUBINUR Negative 10/02/2019 1504   BILIRUBINUR Negative 08/14/2014 2204   KETONESUR NEGATIVE 01/09/2020 1600   PROTEINUR NEGATIVE 01/09/2020 1600   UROBILINOGEN 0.2 09/10/2017 1636   NITRITE NEGATIVE 01/09/2020 1600   LEUKOCYTESUR NEGATIVE 01/09/2020 1600   LEUKOCYTESUR Negative 08/14/2014 2204   Sepsis Labs Invalid input(s): PROCALCITONIN,  WBC,  LACTICIDVEN Microbiology Recent  Results (from the past 240 hour(s))  SARS Coronavirus 2 by RT PCR     Status: None   Collection Time: 01/09/20  9:38 PM  Result Value Ref Range Status   SARS Coronavirus 2 NEGATIVE NEGATIVE Final    Comment: (NOTE) Result indicates the ABSENCE of SARS-CoV-2 RNA in the patient specimen.  The lowest concentration of SARS-CoV-2 viral copies this assay can detect in nasopharyngeal swab specimens is 500 copies / mL.  A negative result does not preclude SARS-CoV-2 infection and should not  be used as the sole basis for patient management decisions. A negative result may occur with improper specimen collection / handling, submission of a specimen other than nasopharyngeal swab, presence of viral mutation(s) within the areas targeted by this assay, and inadequate number of viral copies (<500 copies / mL) present.  Negative results must be combined with clinical observations, patient history, and epidemiological information.  The expected result is NEGATIVE.  Patient Fact Sheet:  https://wong-henderson.biz/   Provider Fact Sheet:  [employer]   This test is not yet approved or cleared by the [place] FDA and  has been authorized for  detection and/or diagnosis of SARS-CoV-2 by FDA under an Emergency Use Authorization (EUA).  This EUA will remain in effect (meaning this test can be used) for the duration of  the COVID-19 declaration under Section 564(b)(1) of the Act, 21 U.S.C. section 360bbb-3(b)(1), unless the authorization is terminated or revoked sooner Performed at [facility], [address], [place], [place] [address]      Time coordinating discharge: 25 minutes      SIGNED:   Alberteen Sam, MD  [facility] 01/13/2020, 1:01 PM

## 2020-01-13 NOTE — TOC Transition Note (Signed)
Transition of Care [facility]) - CM/SW Discharge Note   Patient Details  Name: Maria Bush MRN: [id number] Date of Birth: Sep 20, 1930  [facility] CM/SW Contact:  Maud Deed, LCSW Phone Number:[phone] 01/13/2020, 11:43 AM   Clinical Narrative:    Pt medically stable for discharge. CSW will arrange EMS transport. CSW notified pt son Maria Bush and Maria Bush at [place] of discharge.    Final next level of care: Home w Hospice Care Barriers to Discharge: No Barriers Identified   Patient Goals and CMS Choice Patient states their goals for this hospitalization and ongoing recovery are:: Son answers for pt due to pt's dementia. Son voiced he wants to get services in place to get Mom home.      Discharge Placement                Patient to be transferred to facility by: EMS Name of family member notified: Maria Bush Patient and family notified of of transfer: 01/13/20  Discharge Plan and Services In-house Referral: Clinical Social Work   Post Acute Care Choice: Home Health                    HH Arranged: Nurse's Aide [facility] Agency: [facility] Date HH Agency Contacted: 01/13/20 Time HH Agency Contacted: 1139 Representative spoke with at [facility] Agency: Maria Bush  Social Determinants of Health (SDOH) Interventions     Readmission Risk Interventions No flowsheet data found.

## 2020-01-13 NOTE — Progress Notes (Signed)
Son of patient inquiring about whereabouts of pt's dentures upon discharge. Per son, he remembers handing them off to an MD in the ED. Safety zone portal completed. Son to call patient experience to inquire about reimbursement.

## 2020-01-15 ENCOUNTER — Telehealth: Payer: Self-pay

## 2020-01-15 DIAGNOSIS — E785 Hyperlipidemia, unspecified: Secondary | ICD-10-CM | POA: Diagnosis not present

## 2020-01-15 DIAGNOSIS — J309 Allergic rhinitis, unspecified: Secondary | ICD-10-CM | POA: Diagnosis not present

## 2020-01-15 DIAGNOSIS — L89152 Pressure ulcer of sacral region, stage 2: Secondary | ICD-10-CM | POA: Diagnosis not present

## 2020-01-15 DIAGNOSIS — K219 Gastro-esophageal reflux disease without esophagitis: Secondary | ICD-10-CM | POA: Diagnosis not present

## 2020-01-15 DIAGNOSIS — I69818 Other symptoms and signs involving cognitive functions following other cerebrovascular disease: Secondary | ICD-10-CM | POA: Diagnosis not present

## 2020-01-15 DIAGNOSIS — M1990 Unspecified osteoarthritis, unspecified site: Secondary | ICD-10-CM | POA: Diagnosis not present

## 2020-01-15 DIAGNOSIS — H409 Unspecified glaucoma: Secondary | ICD-10-CM | POA: Diagnosis not present

## 2020-01-15 DIAGNOSIS — R634 Abnormal weight loss: Secondary | ICD-10-CM | POA: Diagnosis not present

## 2020-01-15 DIAGNOSIS — M519 Unspecified thoracic, thoracolumbar and lumbosacral intervertebral disc disorder: Secondary | ICD-10-CM | POA: Diagnosis not present

## 2020-01-15 DIAGNOSIS — I10 Essential (primary) hypertension: Secondary | ICD-10-CM | POA: Diagnosis not present

## 2020-01-15 DIAGNOSIS — I2510 Atherosclerotic heart disease of native coronary artery without angina pectoris: Secondary | ICD-10-CM | POA: Diagnosis not present

## 2020-01-15 DIAGNOSIS — Z8719 Personal history of other diseases of the digestive system: Secondary | ICD-10-CM | POA: Diagnosis not present

## 2020-01-15 DIAGNOSIS — F329 Major depressive disorder, single episode, unspecified: Secondary | ICD-10-CM | POA: Diagnosis not present

## 2020-01-15 DIAGNOSIS — R32 Unspecified urinary incontinence: Secondary | ICD-10-CM | POA: Diagnosis not present

## 2020-01-15 DIAGNOSIS — F0150 Vascular dementia without behavioral disturbance: Secondary | ICD-10-CM | POA: Diagnosis not present

## 2020-01-15 DIAGNOSIS — R627 Adult failure to thrive: Secondary | ICD-10-CM | POA: Diagnosis not present

## 2020-01-15 DIAGNOSIS — E43 Unspecified severe protein-calorie malnutrition: Secondary | ICD-10-CM | POA: Diagnosis not present

## 2020-01-15 DIAGNOSIS — D649 Anemia, unspecified: Secondary | ICD-10-CM | POA: Diagnosis not present

## 2020-01-15 NOTE — Telephone Encounter (Signed)
Transition Care Management Follow-up Telephone Call  Date of discharge and from where: [facility] on 01/13/20  How have you been since you were released from the hospital? Doing ok, she has been resting well and eating soft foods good. Pt is still weak and is not getting around well. Declines pain, fever, SOB or n/v/d. In home Hospice was ordered. We should be receiving a fax from them some time today.  Any questions or concerns? Yes, medication have been discontinued. Son wants PCP aware of the changes.   Items Reviewed:  Did the pt receive and understand the discharge instructions provided? Yes   Medications obtained and verified? Yes   Any new allergies since your discharge? No   Dietary orders reviewed? Yes  Do you have support at home? Yes , pt lives with son.  Other (ie: DME, Home Health, etc) Hospice was ordered for in home care.  Functional Questionnaire: (I = Independent and D = Dependent)  Bathing/Dressing- D   Meal Prep- D  Eating- D  Maintaining continence- D  Transferring/Ambulation- D, uses a walker for assistance moving around.  Managing Meds- D, son manages all medications.    Follow up appointments reviewed:    PCP Hospital f/u appt confirmed? No, HFU not scheduled. Son declined scheduling this apt due to [facility] orders.   Specialist Hospital f/u appt confirmed? N/A   Are transportation arrangements needed? No   If their condition worsens, is the pt aware to call  their PCP or go to the ED? Yes  Was the patient provided with contact information for the PCP's office or ED? Yes  Was the pt encouraged to call back with questions or concerns? Yes

## 2020-01-15 NOTE — Telephone Encounter (Signed)
No HFU scheduled, however a 4 month f/u is scheduled for 01/22/20.

## 2020-01-16 DIAGNOSIS — F0150 Vascular dementia without behavioral disturbance: Secondary | ICD-10-CM | POA: Diagnosis not present

## 2020-01-16 DIAGNOSIS — M519 Unspecified thoracic, thoracolumbar and lumbosacral intervertebral disc disorder: Secondary | ICD-10-CM | POA: Diagnosis not present

## 2020-01-16 DIAGNOSIS — I2510 Atherosclerotic heart disease of native coronary artery without angina pectoris: Secondary | ICD-10-CM | POA: Diagnosis not present

## 2020-01-16 DIAGNOSIS — I69818 Other symptoms and signs involving cognitive functions following other cerebrovascular disease: Secondary | ICD-10-CM | POA: Diagnosis not present

## 2020-01-16 DIAGNOSIS — D649 Anemia, unspecified: Secondary | ICD-10-CM | POA: Diagnosis not present

## 2020-01-16 DIAGNOSIS — E43 Unspecified severe protein-calorie malnutrition: Secondary | ICD-10-CM | POA: Diagnosis not present

## 2020-01-17 DIAGNOSIS — E43 Unspecified severe protein-calorie malnutrition: Secondary | ICD-10-CM | POA: Diagnosis not present

## 2020-01-17 DIAGNOSIS — I2510 Atherosclerotic heart disease of native coronary artery without angina pectoris: Secondary | ICD-10-CM | POA: Diagnosis not present

## 2020-01-17 DIAGNOSIS — M519 Unspecified thoracic, thoracolumbar and lumbosacral intervertebral disc disorder: Secondary | ICD-10-CM | POA: Diagnosis not present

## 2020-01-17 DIAGNOSIS — D649 Anemia, unspecified: Secondary | ICD-10-CM | POA: Diagnosis not present

## 2020-01-17 DIAGNOSIS — F0150 Vascular dementia without behavioral disturbance: Secondary | ICD-10-CM | POA: Diagnosis not present

## 2020-01-17 DIAGNOSIS — I69818 Other symptoms and signs involving cognitive functions following other cerebrovascular disease: Secondary | ICD-10-CM | POA: Diagnosis not present

## 2020-01-19 DIAGNOSIS — D649 Anemia, unspecified: Secondary | ICD-10-CM | POA: Diagnosis not present

## 2020-01-19 DIAGNOSIS — E43 Unspecified severe protein-calorie malnutrition: Secondary | ICD-10-CM | POA: Diagnosis not present

## 2020-01-19 DIAGNOSIS — I69818 Other symptoms and signs involving cognitive functions following other cerebrovascular disease: Secondary | ICD-10-CM | POA: Diagnosis not present

## 2020-01-19 DIAGNOSIS — F0150 Vascular dementia without behavioral disturbance: Secondary | ICD-10-CM | POA: Diagnosis not present

## 2020-01-19 DIAGNOSIS — M519 Unspecified thoracic, thoracolumbar and lumbosacral intervertebral disc disorder: Secondary | ICD-10-CM | POA: Diagnosis not present

## 2020-01-19 DIAGNOSIS — I2510 Atherosclerotic heart disease of native coronary artery without angina pectoris: Secondary | ICD-10-CM | POA: Diagnosis not present

## 2020-01-20 DIAGNOSIS — I499 Cardiac arrhythmia, unspecified: Secondary | ICD-10-CM | POA: Diagnosis not present

## 2020-01-20 DIAGNOSIS — F0150 Vascular dementia without behavioral disturbance: Secondary | ICD-10-CM | POA: Diagnosis not present

## 2020-01-20 DIAGNOSIS — E162 Hypoglycemia, unspecified: Secondary | ICD-10-CM | POA: Diagnosis not present

## 2020-01-20 DIAGNOSIS — E43 Unspecified severe protein-calorie malnutrition: Secondary | ICD-10-CM | POA: Diagnosis not present

## 2020-01-20 DIAGNOSIS — M519 Unspecified thoracic, thoracolumbar and lumbosacral intervertebral disc disorder: Secondary | ICD-10-CM | POA: Diagnosis not present

## 2020-01-20 DIAGNOSIS — D649 Anemia, unspecified: Secondary | ICD-10-CM | POA: Diagnosis not present

## 2020-01-20 DIAGNOSIS — R404 Transient alteration of awareness: Secondary | ICD-10-CM | POA: Diagnosis not present

## 2020-01-20 DIAGNOSIS — I2510 Atherosclerotic heart disease of native coronary artery without angina pectoris: Secondary | ICD-10-CM | POA: Diagnosis not present

## 2020-01-20 DIAGNOSIS — E161 Other hypoglycemia: Secondary | ICD-10-CM | POA: Diagnosis not present

## 2020-01-20 DIAGNOSIS — I69818 Other symptoms and signs involving cognitive functions following other cerebrovascular disease: Secondary | ICD-10-CM | POA: Diagnosis not present

## 2020-01-22 ENCOUNTER — Encounter (INDEPENDENT_AMBULATORY_CARE_PROVIDER_SITE_OTHER): Payer: Medicare Other | Admitting: Family Medicine

## 2020-01-22 ENCOUNTER — Ambulatory Visit: Payer: Self-pay | Admitting: Family Medicine

## 2020-01-22 DIAGNOSIS — Z9181 History of falling: Secondary | ICD-10-CM

## 2020-01-22 DIAGNOSIS — K5790 Diverticulosis of intestine, part unspecified, without perforation or abscess without bleeding: Secondary | ICD-10-CM | POA: Diagnosis not present

## 2020-01-22 DIAGNOSIS — M1990 Unspecified osteoarthritis, unspecified site: Secondary | ICD-10-CM | POA: Diagnosis not present

## 2020-01-22 DIAGNOSIS — I10 Essential (primary) hypertension: Secondary | ICD-10-CM

## 2020-01-22 DIAGNOSIS — Z9049 Acquired absence of other specified parts of digestive tract: Secondary | ICD-10-CM | POA: Diagnosis not present

## 2020-01-22 DIAGNOSIS — K219 Gastro-esophageal reflux disease without esophagitis: Secondary | ICD-10-CM

## 2020-01-22 DIAGNOSIS — R011 Cardiac murmur, unspecified: Secondary | ICD-10-CM | POA: Diagnosis not present

## 2020-01-22 DIAGNOSIS — R627 Adult failure to thrive: Secondary | ICD-10-CM | POA: Diagnosis not present

## 2020-01-31 ENCOUNTER — Other Ambulatory Visit: Payer: Self-pay | Admitting: Family Medicine

## 2020-01-31 DIAGNOSIS — R600 Localized edema: Secondary | ICD-10-CM

## 2020-01-31 DIAGNOSIS — I10 Essential (primary) hypertension: Secondary | ICD-10-CM

## 2020-02-01 ENCOUNTER — Other Ambulatory Visit: Payer: Self-pay | Admitting: Family Medicine

## 2020-02-01 DIAGNOSIS — R600 Localized edema: Secondary | ICD-10-CM

## 2020-02-01 DIAGNOSIS — I10 Essential (primary) hypertension: Secondary | ICD-10-CM

## 2020-02-15 DIAGNOSIS — 419620001 Death: Secondary | SNOMED CT | POA: Diagnosis not present

## 2020-02-15 DEATH — deceased
